# Patient Record
Sex: Female | Born: 1940 | Race: Black or African American | Hispanic: No | Smoking: Former smoker
Health system: Southern US, Community
[De-identification: ages and names within clinical notes are randomized; demographics above are authoritative.]

## PROBLEM LIST (undated history)

## (undated) DIAGNOSIS — E785 Hyperlipidemia, unspecified: Secondary | ICD-10-CM

## (undated) DIAGNOSIS — T7840XA Allergy, unspecified, initial encounter: Secondary | ICD-10-CM

## (undated) DIAGNOSIS — M1990 Unspecified osteoarthritis, unspecified site: Secondary | ICD-10-CM

## (undated) DIAGNOSIS — I10 Essential (primary) hypertension: Secondary | ICD-10-CM

## (undated) DIAGNOSIS — N189 Chronic kidney disease, unspecified: Secondary | ICD-10-CM

## (undated) DIAGNOSIS — E119 Type 2 diabetes mellitus without complications: Secondary | ICD-10-CM

## (undated) DIAGNOSIS — H269 Unspecified cataract: Secondary | ICD-10-CM

## (undated) HISTORY — DX: Unspecified osteoarthritis, unspecified site: M19.90

## (undated) HISTORY — DX: Type 2 diabetes mellitus without complications: E11.9

## (undated) HISTORY — DX: Allergy, unspecified, initial encounter: T78.40XA

## (undated) HISTORY — DX: Unspecified cataract: H26.9

## (undated) HISTORY — DX: Essential (primary) hypertension: I10

## (undated) HISTORY — DX: Hyperlipidemia, unspecified: E78.5

---

## 1994-05-16 HISTORY — PX: COLONOSCOPY W/ POLYPECTOMY: SHX1380

## 2004-05-15 ENCOUNTER — Ambulatory Visit: Payer: Self-pay | Admitting: Podiatry

## 2008-04-07 HISTORY — PX: KNEE SURGERY: SHX244

## 2008-08-08 ENCOUNTER — Emergency Department: Payer: Self-pay | Admitting: Emergency Medicine

## 2008-09-11 ENCOUNTER — Ambulatory Visit: Payer: Self-pay | Admitting: Specialist

## 2008-09-26 ENCOUNTER — Ambulatory Visit: Payer: Self-pay | Admitting: Specialist

## 2008-10-20 ENCOUNTER — Ambulatory Visit: Payer: Self-pay | Admitting: Specialist

## 2008-10-26 ENCOUNTER — Emergency Department: Payer: Self-pay | Admitting: Emergency Medicine

## 2009-05-17 ENCOUNTER — Ambulatory Visit: Payer: Self-pay | Admitting: Anesthesiology

## 2010-08-02 ENCOUNTER — Ambulatory Visit: Payer: Self-pay | Admitting: Anesthesiology

## 2011-09-04 DIAGNOSIS — E559 Vitamin D deficiency, unspecified: Secondary | ICD-10-CM | POA: Diagnosis not present

## 2011-09-04 DIAGNOSIS — R7301 Impaired fasting glucose: Secondary | ICD-10-CM | POA: Diagnosis not present

## 2011-09-04 DIAGNOSIS — E785 Hyperlipidemia, unspecified: Secondary | ICD-10-CM | POA: Diagnosis not present

## 2011-09-04 DIAGNOSIS — I10 Essential (primary) hypertension: Secondary | ICD-10-CM | POA: Diagnosis not present

## 2011-09-04 DIAGNOSIS — Z23 Encounter for immunization: Secondary | ICD-10-CM | POA: Diagnosis not present

## 2011-10-01 ENCOUNTER — Ambulatory Visit: Payer: Self-pay | Admitting: Family Medicine

## 2011-10-01 DIAGNOSIS — R928 Other abnormal and inconclusive findings on diagnostic imaging of breast: Secondary | ICD-10-CM | POA: Diagnosis not present

## 2011-10-01 DIAGNOSIS — Z1231 Encounter for screening mammogram for malignant neoplasm of breast: Secondary | ICD-10-CM | POA: Diagnosis not present

## 2011-11-24 ENCOUNTER — Emergency Department: Payer: Self-pay | Admitting: Emergency Medicine

## 2011-11-24 DIAGNOSIS — Z79899 Other long term (current) drug therapy: Secondary | ICD-10-CM | POA: Diagnosis not present

## 2011-11-24 DIAGNOSIS — I10 Essential (primary) hypertension: Secondary | ICD-10-CM | POA: Diagnosis not present

## 2011-11-24 DIAGNOSIS — IMO0002 Reserved for concepts with insufficient information to code with codable children: Secondary | ICD-10-CM | POA: Diagnosis not present

## 2011-11-26 ENCOUNTER — Emergency Department: Payer: Self-pay | Admitting: Emergency Medicine

## 2011-11-26 DIAGNOSIS — Z86010 Personal history of colonic polyps: Secondary | ICD-10-CM | POA: Diagnosis not present

## 2011-11-26 DIAGNOSIS — Z1211 Encounter for screening for malignant neoplasm of colon: Secondary | ICD-10-CM | POA: Diagnosis not present

## 2011-11-26 DIAGNOSIS — Z09 Encounter for follow-up examination after completed treatment for conditions other than malignant neoplasm: Secondary | ICD-10-CM | POA: Diagnosis not present

## 2011-11-26 DIAGNOSIS — Z79899 Other long term (current) drug therapy: Secondary | ICD-10-CM | POA: Diagnosis not present

## 2011-11-26 DIAGNOSIS — I10 Essential (primary) hypertension: Secondary | ICD-10-CM | POA: Diagnosis not present

## 2011-12-01 LAB — WOUND CULTURE

## 2011-12-23 ENCOUNTER — Ambulatory Visit: Payer: Self-pay | Admitting: Unknown Physician Specialty

## 2011-12-23 DIAGNOSIS — K620 Anal polyp: Secondary | ICD-10-CM | POA: Diagnosis not present

## 2011-12-23 DIAGNOSIS — Z9889 Other specified postprocedural states: Secondary | ICD-10-CM | POA: Diagnosis not present

## 2011-12-23 DIAGNOSIS — Z1211 Encounter for screening for malignant neoplasm of colon: Secondary | ICD-10-CM | POA: Diagnosis not present

## 2011-12-23 DIAGNOSIS — D126 Benign neoplasm of colon, unspecified: Secondary | ICD-10-CM | POA: Diagnosis not present

## 2011-12-23 DIAGNOSIS — E785 Hyperlipidemia, unspecified: Secondary | ICD-10-CM | POA: Diagnosis not present

## 2011-12-23 DIAGNOSIS — Z86010 Personal history of colonic polyps: Secondary | ICD-10-CM | POA: Diagnosis not present

## 2011-12-23 DIAGNOSIS — K5730 Diverticulosis of large intestine without perforation or abscess without bleeding: Secondary | ICD-10-CM | POA: Diagnosis not present

## 2011-12-23 DIAGNOSIS — E669 Obesity, unspecified: Secondary | ICD-10-CM | POA: Diagnosis not present

## 2011-12-23 DIAGNOSIS — Z6841 Body Mass Index (BMI) 40.0 and over, adult: Secondary | ICD-10-CM | POA: Diagnosis not present

## 2011-12-23 DIAGNOSIS — K621 Rectal polyp: Secondary | ICD-10-CM | POA: Diagnosis not present

## 2011-12-23 DIAGNOSIS — Z09 Encounter for follow-up examination after completed treatment for conditions other than malignant neoplasm: Secondary | ICD-10-CM | POA: Diagnosis not present

## 2011-12-23 DIAGNOSIS — I10 Essential (primary) hypertension: Secondary | ICD-10-CM | POA: Diagnosis not present

## 2011-12-23 DIAGNOSIS — Z808 Family history of malignant neoplasm of other organs or systems: Secondary | ICD-10-CM | POA: Diagnosis not present

## 2011-12-23 DIAGNOSIS — K648 Other hemorrhoids: Secondary | ICD-10-CM | POA: Diagnosis not present

## 2011-12-23 DIAGNOSIS — Z87891 Personal history of nicotine dependence: Secondary | ICD-10-CM | POA: Diagnosis not present

## 2011-12-23 DIAGNOSIS — Z801 Family history of malignant neoplasm of trachea, bronchus and lung: Secondary | ICD-10-CM | POA: Diagnosis not present

## 2011-12-23 DIAGNOSIS — Z79899 Other long term (current) drug therapy: Secondary | ICD-10-CM | POA: Diagnosis not present

## 2011-12-23 DIAGNOSIS — D128 Benign neoplasm of rectum: Secondary | ICD-10-CM | POA: Diagnosis not present

## 2011-12-23 DIAGNOSIS — Z800 Family history of malignant neoplasm of digestive organs: Secondary | ICD-10-CM | POA: Diagnosis not present

## 2011-12-23 HISTORY — PX: COLONOSCOPY W/ POLYPECTOMY: SHX1380

## 2011-12-24 LAB — PATHOLOGY REPORT

## 2012-04-06 DIAGNOSIS — E785 Hyperlipidemia, unspecified: Secondary | ICD-10-CM | POA: Diagnosis not present

## 2012-04-06 DIAGNOSIS — M25559 Pain in unspecified hip: Secondary | ICD-10-CM | POA: Diagnosis not present

## 2012-04-06 DIAGNOSIS — I10 Essential (primary) hypertension: Secondary | ICD-10-CM | POA: Diagnosis not present

## 2012-05-02 ENCOUNTER — Emergency Department: Payer: Self-pay | Admitting: Emergency Medicine

## 2012-05-02 DIAGNOSIS — Z79899 Other long term (current) drug therapy: Secondary | ICD-10-CM | POA: Diagnosis not present

## 2012-05-02 DIAGNOSIS — H6980 Other specified disorders of Eustachian tube, unspecified ear: Secondary | ICD-10-CM | POA: Diagnosis not present

## 2012-05-02 DIAGNOSIS — R059 Cough, unspecified: Secondary | ICD-10-CM | POA: Diagnosis not present

## 2012-05-02 DIAGNOSIS — I10 Essential (primary) hypertension: Secondary | ICD-10-CM | POA: Diagnosis not present

## 2012-05-02 DIAGNOSIS — R05 Cough: Secondary | ICD-10-CM | POA: Diagnosis not present

## 2012-06-02 DIAGNOSIS — M79609 Pain in unspecified limb: Secondary | ICD-10-CM | POA: Diagnosis not present

## 2012-06-02 DIAGNOSIS — B351 Tinea unguium: Secondary | ICD-10-CM | POA: Diagnosis not present

## 2012-06-02 DIAGNOSIS — M2010 Hallux valgus (acquired), unspecified foot: Secondary | ICD-10-CM | POA: Diagnosis not present

## 2012-07-14 DIAGNOSIS — I10 Essential (primary) hypertension: Secondary | ICD-10-CM | POA: Diagnosis not present

## 2012-07-14 DIAGNOSIS — E785 Hyperlipidemia, unspecified: Secondary | ICD-10-CM | POA: Diagnosis not present

## 2012-08-25 DIAGNOSIS — B351 Tinea unguium: Secondary | ICD-10-CM | POA: Diagnosis not present

## 2012-08-25 DIAGNOSIS — M79609 Pain in unspecified limb: Secondary | ICD-10-CM | POA: Diagnosis not present

## 2012-10-26 DIAGNOSIS — E785 Hyperlipidemia, unspecified: Secondary | ICD-10-CM | POA: Diagnosis not present

## 2012-10-26 DIAGNOSIS — M25559 Pain in unspecified hip: Secondary | ICD-10-CM | POA: Diagnosis not present

## 2012-10-26 DIAGNOSIS — I10 Essential (primary) hypertension: Secondary | ICD-10-CM | POA: Diagnosis not present

## 2012-11-03 DIAGNOSIS — M1710 Unilateral primary osteoarthritis, unspecified knee: Secondary | ICD-10-CM | POA: Diagnosis not present

## 2012-11-03 DIAGNOSIS — M1610 Unilateral primary osteoarthritis, unspecified hip: Secondary | ICD-10-CM | POA: Diagnosis not present

## 2012-11-10 DIAGNOSIS — M1610 Unilateral primary osteoarthritis, unspecified hip: Secondary | ICD-10-CM | POA: Diagnosis not present

## 2012-11-10 DIAGNOSIS — M1710 Unilateral primary osteoarthritis, unspecified knee: Secondary | ICD-10-CM | POA: Diagnosis not present

## 2012-11-12 DIAGNOSIS — M1710 Unilateral primary osteoarthritis, unspecified knee: Secondary | ICD-10-CM | POA: Diagnosis not present

## 2012-11-12 DIAGNOSIS — M1610 Unilateral primary osteoarthritis, unspecified hip: Secondary | ICD-10-CM | POA: Diagnosis not present

## 2012-11-16 DIAGNOSIS — M1610 Unilateral primary osteoarthritis, unspecified hip: Secondary | ICD-10-CM | POA: Diagnosis not present

## 2012-11-16 DIAGNOSIS — M1710 Unilateral primary osteoarthritis, unspecified knee: Secondary | ICD-10-CM | POA: Diagnosis not present

## 2012-11-17 ENCOUNTER — Ambulatory Visit: Payer: Self-pay | Admitting: Family Medicine

## 2012-11-17 DIAGNOSIS — Z1231 Encounter for screening mammogram for malignant neoplasm of breast: Secondary | ICD-10-CM | POA: Diagnosis not present

## 2012-11-18 DIAGNOSIS — M1710 Unilateral primary osteoarthritis, unspecified knee: Secondary | ICD-10-CM | POA: Diagnosis not present

## 2012-11-18 DIAGNOSIS — M1610 Unilateral primary osteoarthritis, unspecified hip: Secondary | ICD-10-CM | POA: Diagnosis not present

## 2012-11-24 DIAGNOSIS — B351 Tinea unguium: Secondary | ICD-10-CM | POA: Diagnosis not present

## 2012-11-24 DIAGNOSIS — M79609 Pain in unspecified limb: Secondary | ICD-10-CM | POA: Diagnosis not present

## 2013-01-26 DIAGNOSIS — E785 Hyperlipidemia, unspecified: Secondary | ICD-10-CM | POA: Diagnosis not present

## 2013-01-26 DIAGNOSIS — I10 Essential (primary) hypertension: Secondary | ICD-10-CM | POA: Diagnosis not present

## 2013-02-11 DIAGNOSIS — H43399 Other vitreous opacities, unspecified eye: Secondary | ICD-10-CM | POA: Diagnosis not present

## 2013-02-11 DIAGNOSIS — H35379 Puckering of macula, unspecified eye: Secondary | ICD-10-CM | POA: Diagnosis not present

## 2013-02-21 ENCOUNTER — Encounter: Payer: Self-pay | Admitting: Podiatry

## 2013-02-23 ENCOUNTER — Ambulatory Visit (INDEPENDENT_AMBULATORY_CARE_PROVIDER_SITE_OTHER): Payer: Medicare Other | Admitting: Podiatry

## 2013-02-23 ENCOUNTER — Encounter: Payer: Self-pay | Admitting: Podiatry

## 2013-02-23 VITALS — BP 132/66 | HR 82 | Resp 16 | Ht 63.0 in | Wt 243.0 lb

## 2013-02-23 DIAGNOSIS — B351 Tinea unguium: Secondary | ICD-10-CM | POA: Diagnosis not present

## 2013-02-23 DIAGNOSIS — M79609 Pain in unspecified limb: Secondary | ICD-10-CM

## 2013-02-23 NOTE — Progress Notes (Signed)
Tammy Lozano presents today as a 72-year-old white female a chief complaint of painful toenails one through 5 bilateral.  Objective: Pulses are palpable bilateral. Nails are thick yellow dystrophic clinically mycotic. Painful on palpation.  Assessment: Pain in limb secondary to onychomycosis 1 through 5 bilateral.  Plan: Debridement nails 1 through 5 bilateral. 

## 2013-04-28 DIAGNOSIS — I10 Essential (primary) hypertension: Secondary | ICD-10-CM | POA: Diagnosis not present

## 2013-04-28 DIAGNOSIS — E785 Hyperlipidemia, unspecified: Secondary | ICD-10-CM | POA: Diagnosis not present

## 2013-05-04 DIAGNOSIS — E870 Hyperosmolality and hypernatremia: Secondary | ICD-10-CM | POA: Diagnosis not present

## 2013-05-25 ENCOUNTER — Ambulatory Visit: Payer: Medicare Other | Admitting: Podiatry

## 2013-05-30 DIAGNOSIS — N289 Disorder of kidney and ureter, unspecified: Secondary | ICD-10-CM | POA: Diagnosis not present

## 2013-05-30 DIAGNOSIS — M25559 Pain in unspecified hip: Secondary | ICD-10-CM | POA: Diagnosis not present

## 2013-06-24 DIAGNOSIS — N179 Acute kidney failure, unspecified: Secondary | ICD-10-CM | POA: Diagnosis not present

## 2013-06-24 DIAGNOSIS — I10 Essential (primary) hypertension: Secondary | ICD-10-CM | POA: Diagnosis not present

## 2013-06-24 DIAGNOSIS — E785 Hyperlipidemia, unspecified: Secondary | ICD-10-CM | POA: Diagnosis not present

## 2013-06-27 DIAGNOSIS — G8929 Other chronic pain: Secondary | ICD-10-CM | POA: Diagnosis not present

## 2013-06-27 DIAGNOSIS — E785 Hyperlipidemia, unspecified: Secondary | ICD-10-CM | POA: Diagnosis not present

## 2013-06-27 DIAGNOSIS — R7309 Other abnormal glucose: Secondary | ICD-10-CM | POA: Diagnosis not present

## 2013-06-27 DIAGNOSIS — M25559 Pain in unspecified hip: Secondary | ICD-10-CM | POA: Diagnosis not present

## 2013-07-07 DIAGNOSIS — N179 Acute kidney failure, unspecified: Secondary | ICD-10-CM | POA: Diagnosis not present

## 2013-07-20 DIAGNOSIS — G8929 Other chronic pain: Secondary | ICD-10-CM | POA: Diagnosis not present

## 2013-07-20 DIAGNOSIS — E785 Hyperlipidemia, unspecified: Secondary | ICD-10-CM | POA: Diagnosis not present

## 2013-07-20 DIAGNOSIS — M25559 Pain in unspecified hip: Secondary | ICD-10-CM | POA: Diagnosis not present

## 2013-07-20 DIAGNOSIS — N183 Chronic kidney disease, stage 3 unspecified: Secondary | ICD-10-CM | POA: Diagnosis not present

## 2013-08-02 DIAGNOSIS — N179 Acute kidney failure, unspecified: Secondary | ICD-10-CM | POA: Diagnosis not present

## 2013-08-05 DIAGNOSIS — N179 Acute kidney failure, unspecified: Secondary | ICD-10-CM | POA: Diagnosis not present

## 2013-08-05 DIAGNOSIS — I10 Essential (primary) hypertension: Secondary | ICD-10-CM | POA: Diagnosis not present

## 2013-08-05 DIAGNOSIS — R609 Edema, unspecified: Secondary | ICD-10-CM | POA: Diagnosis not present

## 2013-10-24 DIAGNOSIS — I10 Essential (primary) hypertension: Secondary | ICD-10-CM | POA: Diagnosis not present

## 2013-10-24 DIAGNOSIS — R7989 Other specified abnormal findings of blood chemistry: Secondary | ICD-10-CM | POA: Diagnosis not present

## 2013-10-24 DIAGNOSIS — E785 Hyperlipidemia, unspecified: Secondary | ICD-10-CM | POA: Diagnosis not present

## 2014-01-24 DIAGNOSIS — I10 Essential (primary) hypertension: Secondary | ICD-10-CM | POA: Diagnosis not present

## 2014-01-24 DIAGNOSIS — R7989 Other specified abnormal findings of blood chemistry: Secondary | ICD-10-CM | POA: Diagnosis not present

## 2014-01-24 DIAGNOSIS — E785 Hyperlipidemia, unspecified: Secondary | ICD-10-CM | POA: Diagnosis not present

## 2014-01-24 DIAGNOSIS — R748 Abnormal levels of other serum enzymes: Secondary | ICD-10-CM | POA: Diagnosis not present

## 2014-01-24 DIAGNOSIS — Z1389 Encounter for screening for other disorder: Secondary | ICD-10-CM | POA: Diagnosis not present

## 2014-01-24 DIAGNOSIS — R296 Repeated falls: Secondary | ICD-10-CM | POA: Diagnosis not present

## 2014-01-27 DIAGNOSIS — E876 Hypokalemia: Secondary | ICD-10-CM | POA: Diagnosis not present

## 2014-03-06 ENCOUNTER — Ambulatory Visit (INDEPENDENT_AMBULATORY_CARE_PROVIDER_SITE_OTHER): Payer: Medicare Other | Admitting: Podiatry

## 2014-03-06 ENCOUNTER — Ambulatory Visit: Payer: Medicare Other | Admitting: Podiatry

## 2014-03-06 DIAGNOSIS — B351 Tinea unguium: Secondary | ICD-10-CM

## 2014-03-06 DIAGNOSIS — M79673 Pain in unspecified foot: Secondary | ICD-10-CM | POA: Diagnosis not present

## 2014-03-06 NOTE — Progress Notes (Signed)
Tammy Lozano presents today as a 73 year old white female a chief complaint of painful toenails one through 5 bilateral.  Objective: Pulses are palpable bilateral. Nails are thick yellow dystrophic clinically mycotic. Painful on palpation.  Assessment: Pain in limb secondary to onychomycosis 1 through 5 bilateral.  Plan: Debridement nails 1 through 5 bilateral.

## 2014-04-27 DIAGNOSIS — I10 Essential (primary) hypertension: Secondary | ICD-10-CM | POA: Diagnosis not present

## 2014-04-27 DIAGNOSIS — E785 Hyperlipidemia, unspecified: Secondary | ICD-10-CM | POA: Diagnosis not present

## 2014-06-05 ENCOUNTER — Other Ambulatory Visit: Payer: Medicare Other

## 2014-06-14 ENCOUNTER — Ambulatory Visit (INDEPENDENT_AMBULATORY_CARE_PROVIDER_SITE_OTHER): Payer: Medicare Other | Admitting: Podiatry

## 2014-06-14 DIAGNOSIS — B351 Tinea unguium: Secondary | ICD-10-CM

## 2014-06-14 DIAGNOSIS — M79676 Pain in unspecified toe(s): Secondary | ICD-10-CM

## 2014-06-14 NOTE — Progress Notes (Signed)
Tammy Lozano presents today as a 74 year old white female a chief complaint of painful toenails one through 5 bilateral.  Objective: Pulses are palpable bilateral. Nails are thick yellow dystrophic clinically mycotic. Painful on palpation.  Assessment: Pain in limb secondary to onychomycosis 1 through 5 bilateral.  Plan: Debridement nails 1 through 5 bilateral.

## 2014-06-26 DIAGNOSIS — R05 Cough: Secondary | ICD-10-CM | POA: Diagnosis not present

## 2014-07-27 ENCOUNTER — Ambulatory Visit
Admit: 2014-07-27 | Disposition: A | Discharge status: Home / Self Care | Payer: Self-pay | Attending: Family Medicine | Admitting: Family Medicine

## 2014-07-27 DIAGNOSIS — R7309 Other abnormal glucose: Secondary | ICD-10-CM | POA: Diagnosis not present

## 2014-07-27 DIAGNOSIS — R05 Cough: Secondary | ICD-10-CM | POA: Diagnosis not present

## 2014-07-27 DIAGNOSIS — E785 Hyperlipidemia, unspecified: Secondary | ICD-10-CM | POA: Diagnosis not present

## 2014-07-27 DIAGNOSIS — I10 Essential (primary) hypertension: Secondary | ICD-10-CM | POA: Diagnosis not present

## 2014-08-10 DIAGNOSIS — N189 Chronic kidney disease, unspecified: Secondary | ICD-10-CM | POA: Diagnosis not present

## 2014-08-10 DIAGNOSIS — I10 Essential (primary) hypertension: Secondary | ICD-10-CM | POA: Diagnosis not present

## 2014-08-21 ENCOUNTER — Ambulatory Visit (INDEPENDENT_AMBULATORY_CARE_PROVIDER_SITE_OTHER): Payer: Medicare Other | Admitting: Podiatry

## 2014-08-21 DIAGNOSIS — B351 Tinea unguium: Secondary | ICD-10-CM | POA: Diagnosis not present

## 2014-08-21 DIAGNOSIS — M79676 Pain in unspecified toe(s): Secondary | ICD-10-CM | POA: Diagnosis not present

## 2014-08-21 NOTE — Progress Notes (Signed)
Lauralye presents today as a 74 year old white female a chief complaint of painful toenails one through 5 bilateral.  Objective: Pulses are palpable bilateral. Nails are thick yellow dystrophic clinically mycotic. Painful on palpation.  Assessment: Pain in limb secondary to onychomycosis 1 through 5 bilateral.  Plan: Debridement nails 1 through 5 bilateral.

## 2014-08-31 DIAGNOSIS — I10 Essential (primary) hypertension: Secondary | ICD-10-CM | POA: Diagnosis not present

## 2014-11-10 ENCOUNTER — Telehealth: Payer: Self-pay | Admitting: Family Medicine

## 2014-11-10 NOTE — Telephone Encounter (Signed)
PT NEEDS REFILLS ON HER ATORVASTIN 20MG . PHARM IS [facility] ON [address]. PT ONLY HAS 4 LEFT.

## 2014-11-13 MED ORDER — ATORVASTATIN CALCIUM 20 MG PO TABS: 20.0000 mg | ORAL_TABLET | Freq: Every day | ORAL | Status: DC

## 2014-11-13 NOTE — Telephone Encounter (Signed)
Medication has been refilled and sent to [facility] [address] 

## 2014-11-22 ENCOUNTER — Ambulatory Visit: Payer: Medicare Other | Admitting: Podiatry

## 2014-11-27 ENCOUNTER — Ambulatory Visit (INDEPENDENT_AMBULATORY_CARE_PROVIDER_SITE_OTHER): Payer: Medicare Other | Admitting: Podiatry

## 2014-11-27 DIAGNOSIS — M79676 Pain in unspecified toe(s): Secondary | ICD-10-CM

## 2014-11-27 DIAGNOSIS — B351 Tinea unguium: Secondary | ICD-10-CM

## 2014-11-27 NOTE — Progress Notes (Signed)
Tammy Lozano presents today as a 74 year old white female a chief complaint of painful toenails one through 5 bilateral. She would also like for [place] to take a look at her feet.  Objective: Pulses are palpable bilateral. Nails are thick yellow dystrophic clinically mycotic. Painful on palpation. No open lesions no open wounds mild digital deformities uncomplicated.  Assessment: Pain in limb secondary to onychomycosis 1 through 5 bilateral.  Plan: Debridement nails 1 through 5 bilateral. 3 mo.  Roselind Messier DPM

## 2014-12-04 ENCOUNTER — Encounter: Payer: Self-pay | Admitting: Family Medicine

## 2014-12-04 ENCOUNTER — Ambulatory Visit (INDEPENDENT_AMBULATORY_CARE_PROVIDER_SITE_OTHER): Payer: Medicare Other | Admitting: Family Medicine

## 2014-12-04 VITALS — BP 128/72 | HR 119 | Temp 97.8°F | Resp 18 | Wt 238.6 lb

## 2014-12-04 DIAGNOSIS — E785 Hyperlipidemia, unspecified: Secondary | ICD-10-CM

## 2014-12-04 DIAGNOSIS — I10 Essential (primary) hypertension: Secondary | ICD-10-CM

## 2014-12-04 MED ORDER — ATORVASTATIN CALCIUM 20 MG PO TABS: 20.0000 mg | ORAL_TABLET | Freq: Every day | ORAL | Status: DC

## 2014-12-04 MED ORDER — LOSARTAN POTASSIUM-HCTZ 50-12.5 MG PO TABS
1.0000 | ORAL_TABLET | Freq: Every day | ORAL | Status: DC
Start: 2014-12-04 — End: 2015-03-06

## 2014-12-04 NOTE — Progress Notes (Signed)
Name: Tammy Lozano   MRN: [id number]    DOB: Sep 05, 1940   Date:12/04/2014       Progress Note  Subjective  Chief Complaint  Chief Complaint  Patient presents with  . Follow-up    patient is here for her 18-month follow-up. patient stated she does not need any refill of her medciations.  . Hypertension  . Arthritis    left hip    Hypertension This is a chronic problem. The problem is controlled. Pertinent negatives include no chest pain, headaches, palpitations or shortness of breath. Past treatments include angiotensin blockers and diuretics. The current treatment provides significant improvement. There is no history of kidney disease, CAD/MI or CVA.  Hyperlipidemia This is a chronic problem. The problem is controlled. Recent lipid tests were reviewed and are normal. Pertinent negatives include no chest pain, leg pain, myalgias or shortness of breath. Current antihyperlipidemic treatment includes statins. There are no compliance problems.     Past Medical History  Diagnosis Date  . Osteoarthritis   . Hypertension   . Hyperlipidemia     Past Surgical History  Procedure Laterality Date  . Knee surgery  2010    Family History  Problem Relation Age of Onset  . Cancer Mother   . Cancer Father     Social History   Social History  . Marital Status: Divorced    Spouse Name: N/A  . Number of Children: N/A  . Years of Education: N/A   Occupational History  . Not on file.   Social History Main Topics  . Smoking status: Former Smoker    Types: Cigarettes  . Smokeless tobacco: Not on file  . Alcohol Use: No  . Drug Use: No  . Sexual Activity: No   Other Topics Concern  . Not on file   Social History Narrative     Current outpatient prescriptions:  .  Potassium Gluconate 595 MG CAPS, Take by mouth., Disp: , Rfl:  .  atorvastatin (LIPITOR) 20 MG tablet, Take 1 tablet (20 mg total) by mouth daily., Disp: 90 tablet, Rfl: 0 .  Cholecalciferol (VITAMIN D-3 PO), Take by  mouth., Disp: , Rfl:  .  losartan-hydrochlorothiazide (HYZAAR) 50-12.5 MG per tablet, , Disp: , Rfl:  .  Omega-3 Fatty Acids (FISH OIL) 1000 MG CAPS, Take 1,000 mg by mouth., Disp: , Rfl:   No Known Allergies   Review of Systems  Respiratory: Negative for shortness of breath.   Cardiovascular: Negative for chest pain and palpitations.  Musculoskeletal: Negative for myalgias.  Neurological: Negative for headaches.      Objective  Filed Vitals:   12/04/14 0920  BP: 128/72  Pulse: 119  Temp: 97.8 F (36.6 C)  TempSrc: Oral  Resp: 18  Weight: 238 lb 9.6 oz (108.228 kg)  SpO2: 95%    Physical Exam  Constitutional: She is oriented to person, place, and time and well-developed, well-nourished, and in no distress.  HENT:  Head: Normocephalic and atraumatic.  Cardiovascular: Normal rate and regular rhythm.   Pulmonary/Chest: Effort normal and breath sounds normal.  Musculoskeletal: She exhibits no edema.  Neurological: She is alert and oriented to person, place, and time.  Skin: Skin is warm and dry.  Nursing note and vitals reviewed.   Assessment & Plan  1. Essential hypertension Blood pressure stable on present therapy.  - losartan-hydrochlorothiazide (HYZAAR) 50-12.5 MG per tablet; Take 1 tablet by mouth daily.  Dispense: 90 tablet; Refill: 0  2. Hyperlipidemia Recheck lipid panel in November  2016.  - atorvastatin (LIPITOR) 20 MG tablet; Take 1 tablet (20 mg total) by mouth daily.  Dispense: 90 tablet; Refill: 0   Tammy Lozano Asad A. [facility] 12/04/2014 9:43 AM

## 2015-02-26 ENCOUNTER — Ambulatory Visit: Payer: Medicare Other

## 2015-03-06 ENCOUNTER — Ambulatory Visit (INDEPENDENT_AMBULATORY_CARE_PROVIDER_SITE_OTHER): Payer: Medicare Other | Admitting: Family Medicine

## 2015-03-06 ENCOUNTER — Encounter: Payer: Self-pay | Admitting: Family Medicine

## 2015-03-06 VITALS — BP 126/70 | HR 126 | Temp 97.7°F | Resp 20 | Ht 64.0 in | Wt 238.2 lb

## 2015-03-06 DIAGNOSIS — I10 Essential (primary) hypertension: Secondary | ICD-10-CM | POA: Diagnosis not present

## 2015-03-06 DIAGNOSIS — E785 Hyperlipidemia, unspecified: Secondary | ICD-10-CM | POA: Diagnosis not present

## 2015-03-06 MED ORDER — HYDROCHLOROTHIAZIDE 25 MG PO TABS: 25.0000 mg | ORAL_TABLET | Freq: Every day | ORAL | Status: DC

## 2015-03-06 NOTE — Progress Notes (Signed)
Name: Tammy Lozano   MRN: [id number]    DOB: August 04, 1940   Date:03/06/2015       Progress Note  Subjective  Chief Complaint  Chief Complaint  Patient presents with  . Follow-up    3 MO  . Labs Only    Fasting  . Hypertension  . Hyperlipidemia    Hypertension This is a chronic problem. The problem is controlled. Pertinent negatives include no blurred vision, chest pain, headaches, palpitations or shortness of breath. Past treatments include angiotensin blockers and diuretics. The current treatment provides significant improvement. There is no history of kidney disease, CAD/MI or CVA.  Hyperlipidemia This is a chronic problem. The problem is controlled. Recent lipid tests were reviewed and are normal. Exacerbating diseases include obesity. Pertinent negatives include no chest pain, leg pain, myalgias or shortness of breath. Current antihyperlipidemic treatment includes statins. There are no compliance problems.     Past Medical History  Diagnosis Date  . Osteoarthritis   . Hypertension   . Hyperlipidemia     Past Surgical History  Procedure Laterality Date  . Knee surgery  2010    Family History  Problem Relation Age of Onset  . Cancer Mother   . Cancer Father     Social History   Social History  . Marital Status: Divorced    Spouse Name: N/A  . Number of Children: N/A  . Years of Education: N/A   Occupational History  . Not on file.   Social History Main Topics  . Smoking status: Former Smoker    Types: Cigarettes  . Smokeless tobacco: Not on file  . Alcohol Use: No  . Drug Use: No  . Sexual Activity: No   Other Topics Concern  . Not on file   Social History Narrative    Current outpatient prescriptions:  .  atorvastatin (LIPITOR) 20 MG tablet, Take 1 tablet (20 mg total) by mouth daily., Disp: 90 tablet, Rfl: 0 .  Cholecalciferol (VITAMIN D-3 PO), Take by mouth., Disp: , Rfl:  .  losartan-hydrochlorothiazide (HYZAAR) 50-12.5 MG per tablet, Take 1  tablet by mouth daily., Disp: 90 tablet, Rfl: 0 .  Omega-3 Fatty Acids (FISH OIL) 1000 MG CAPS, Take 1,000 mg by mouth., Disp: , Rfl:  .  Potassium Gluconate 595 MG CAPS, Take by mouth., Disp: , Rfl:   No Known Allergies   Review of Systems  Eyes: Negative for blurred vision.  Respiratory: Positive for cough (dry cough for over 1 month, described as 'tickle in my throat'. Pt. had to D/C Lisinopril because of cough). Negative for shortness of breath.   Cardiovascular: Negative for chest pain and palpitations.  Musculoskeletal: Negative for myalgias.  Neurological: Negative for headaches.    Objective  Filed Vitals:   03/06/15 0838  BP: 126/70  Pulse: 126  Temp: 97.7 F (36.5 C)  TempSrc: Oral  Resp: 20  Height: 5\' 4"  (1.626 m)  Weight: 238 lb 3.2 oz (108.047 kg)  SpO2: 96%    Physical Exam  Constitutional: She is oriented to person, place, and time and well-developed, well-nourished, and in no distress.  HENT:  Mouth/Throat: Oropharynx is clear and moist and mucous membranes are normal.  Cardiovascular: Normal rate, regular rhythm and normal heart sounds.   No murmur heard. Pulmonary/Chest: Effort normal and breath sounds normal. She has no wheezes.  Musculoskeletal: She exhibits no edema.  Neurological: She is alert and oriented to person, place, and time.  Nursing note and vitals reviewed.  Assessment & Plan  1. Essential hypertension DC losartan due to possible adverse effect of cough. We will start on hydrochlorothiazide at a higher dose of 25 mg daily. Recheck BP in one month. - hydrochlorothiazide (HYDRODIURIL) 25 MG tablet; Take 1 tablet (25 mg total) by mouth daily.  Dispense: 90 tablet; Refill: 0  2. Hyperlipidemia  - Comprehensive Metabolic Panel (CMET) - Lipid Profile   Denecia Brunette Asad A. [facility] 03/06/2015 8:53 AM

## 2015-03-07 DIAGNOSIS — E785 Hyperlipidemia, unspecified: Secondary | ICD-10-CM | POA: Diagnosis not present

## 2015-03-08 LAB — COMPREHENSIVE METABOLIC PANEL
ALT: 10 IU/L (ref 0–32)
AST: 11 IU/L (ref 0–40)
Albumin/Globulin Ratio: 1.6 (ref 1.1–2.5)
Albumin: 4.3 g/dL (ref 3.5–4.8)
Alkaline Phosphatase: 93 IU/L (ref 39–117)
BUN/Creatinine Ratio: 13 (ref 11–26)
BUN: 16 mg/dL (ref 8–27)
Bilirubin Total: 1.2 mg/dL (ref 0.0–1.2)
CO2: 24 mmol/L (ref 18–29)
Calcium: 9.9 mg/dL (ref 8.7–10.3)
Chloride: 94 mmol/L — ABNORMAL LOW (ref 97–106)
Creatinine, Ser: 1.22 mg/dL — ABNORMAL HIGH (ref 0.57–1.00)
GFR calc Af Amer: 50 mL/min/{1.73_m2} — ABNORMAL LOW (ref >59)
GFR calc non Af Amer: 43 mL/min/{1.73_m2} — ABNORMAL LOW (ref >59)
Globulin, Total: 2.7 g/dL (ref 1.5–4.5)
Glucose: 122 mg/dL — ABNORMAL HIGH (ref 65–99)
Potassium: 3.9 mmol/L (ref 3.5–5.2)
Sodium: 138 mmol/L (ref 136–144)
Total Protein: 7 g/dL (ref 6.0–8.5)

## 2015-03-08 LAB — LIPID PANEL
Chol/HDL Ratio: 3.9 ratio units (ref 0.0–4.4)
Cholesterol, Total: 189 mg/dL (ref 100–199)
HDL: 49 mg/dL (ref >39)
LDL Calculated: 113 mg/dL — ABNORMAL HIGH (ref 0–99)
Triglycerides: 134 mg/dL (ref 0–149)
VLDL Cholesterol Cal: 27 mg/dL (ref 5–40)

## 2015-03-13 ENCOUNTER — Ambulatory Visit (INDEPENDENT_AMBULATORY_CARE_PROVIDER_SITE_OTHER): Payer: Medicare Other | Admitting: Sports Medicine

## 2015-03-13 DIAGNOSIS — L84 Corns and callosities: Secondary | ICD-10-CM

## 2015-03-13 DIAGNOSIS — M79676 Pain in unspecified toe(s): Secondary | ICD-10-CM

## 2015-03-13 DIAGNOSIS — B351 Tinea unguium: Secondary | ICD-10-CM

## 2015-03-13 NOTE — Progress Notes (Signed)
Patient ID: SAANVI MALIA, female   DOB: 1941/02/04, 74 y.o.   MRN: [id number] Subjective: Tammy Lozano is a 74 y.o. female patient seen today in office with complaint of painful thickened and elongated toenails; unable to trim. Patient denies history of Diabetes, Neuropathy, or Vascular disease. Patient has no other pedal complaints at this time.   Patient Active Problem List   Diagnosis Date Noted  . Hypertension 12/04/2014  . Hyperlipidemia 12/04/2014   Current Outpatient Prescriptions on File Prior to Visit  Medication Sig Dispense Refill  . atorvastatin (LIPITOR) 20 MG tablet Take 1 tablet (20 mg total) by mouth daily. 90 tablet 0  . Cholecalciferol (VITAMIN D-3 PO) Take by mouth.    . hydrochlorothiazide (HYDRODIURIL) 25 MG tablet Take 1 tablet (25 mg total) by mouth daily. 90 tablet 0  . Omega-3 Fatty Acids (FISH OIL) 1000 MG CAPS Take 1,000 mg by mouth.    . Potassium Gluconate 595 MG CAPS Take by mouth.     No current facility-administered medications on file prior to visit.   No Known Allergies   Objective: Physical Exam  General: Well developed, nourished, no acute distress, awake, alert and oriented x 3  Vascular: Dorsalis pedis artery 2/4 bilateral, Posterior tibial artery 1/4 bilateral, skin temperature warm to warm proximal to distal bilateral lower extremities, no varicosities, scant pedal hair present bilateral.  Neurological: Gross sensation present via light touch bilateral.   Dermatological: Skin is warm, dry, and supple bilateral, Nails 1-10 are tender, long, thick, and discolored with mild subungal debris, no webspace macerations present bilateral, no open lesions present bilateral, + callus/hyperkeratotic tissue present bilateral medial hallux with no signs of infection bilateral.  Musculoskeletal: Asymptomatic bunion and hammertoe boney deformities noted bilateral. Muscular strength within normal limits without pain or limitation on range of motion. No pain with  calf compression bilateral.  Assessment and Plan:  Problem List Items Addressed This Visit    None    Visit Diagnoses    Dermatophytosis of nail    -  Primary    Callus of foot        medial hallux bilateral    Pain of toe, unspecified laterality          -Examined patient.  -Discussed treatment options for painful callus and mycotic nails. -Mechanically debrided callus x 2 and reduced mycotic nails with sterile nail nipper and dremel nail file without incident. -Recommend good supportive shoes and foot inspection daily  -Patient to return in 3 months for follow up evaluation or sooner if symptoms worsen.  Landis Martins, DPM

## 2015-04-05 ENCOUNTER — Ambulatory Visit: Payer: Medicare Other | Admitting: Family Medicine

## 2015-04-18 ENCOUNTER — Ambulatory Visit: Payer: Medicare Other | Admitting: Family Medicine

## 2015-05-08 ENCOUNTER — Encounter: Payer: Self-pay | Admitting: Family Medicine

## 2015-05-08 ENCOUNTER — Ambulatory Visit (INDEPENDENT_AMBULATORY_CARE_PROVIDER_SITE_OTHER): Payer: Medicare Other | Admitting: Family Medicine

## 2015-05-08 VITALS — BP 122/80 | HR 116 | Temp 97.8°F | Resp 16 | Ht 64.0 in | Wt 235.8 lb

## 2015-05-08 DIAGNOSIS — R05 Cough: Secondary | ICD-10-CM

## 2015-05-08 DIAGNOSIS — H65199 Other acute nonsuppurative otitis media, unspecified ear: Secondary | ICD-10-CM | POA: Insufficient documentation

## 2015-05-08 DIAGNOSIS — R053 Chronic cough: Secondary | ICD-10-CM

## 2015-05-08 DIAGNOSIS — E785 Hyperlipidemia, unspecified: Secondary | ICD-10-CM

## 2015-05-08 DIAGNOSIS — I10 Essential (primary) hypertension: Secondary | ICD-10-CM

## 2015-05-08 DIAGNOSIS — H65192 Other acute nonsuppurative otitis media, left ear: Secondary | ICD-10-CM | POA: Diagnosis not present

## 2015-05-08 MED ORDER — ATORVASTATIN CALCIUM 20 MG PO TABS: 20.0000 mg | ORAL_TABLET | Freq: Every day | ORAL | Status: DC

## 2015-05-08 MED ORDER — BENZONATATE 200 MG PO CAPS: 200.0000 mg | ORAL_CAPSULE | Freq: Two times a day (BID) | ORAL | Status: DC | PRN

## 2015-05-08 MED ORDER — FLUTICASONE PROPIONATE 50 MCG/ACT NA SUSP: 2.0000 | Freq: Every day | NASAL | Status: DC

## 2015-05-08 MED ORDER — HYDROCHLOROTHIAZIDE 25 MG PO TABS: 25.0000 mg | ORAL_TABLET | Freq: Every day | ORAL | Status: DC

## 2015-05-08 NOTE — Progress Notes (Signed)
Name: Tammy Lozano   MRN: [id number]    DOB: June 24, 1940   Date:05/08/2015       Progress Note  Subjective  Chief Complaint  Chief Complaint  Patient presents with  . Follow-up    1 month  . Hypertension    at last visit changed Losartan due to cough to HCTZ.  Patienjt reports today she still has a cough.    Hypertension This is a chronic problem. The problem is controlled. Pertinent negatives include no blurred vision, chest pain, headaches, palpitations or shortness of breath. Past treatments include diuretics. The current treatment provides significant improvement. There is no history of kidney disease.  Cough This is a recurrent problem. The current episode started more than 1 month ago. The cough is non-productive. Associated symptoms include nasal congestion (started having URI symptoms 2 days ago. Dry cough was present prioir to onset of symptoms.). Pertinent negatives include no chest pain, chills, fever, headaches, postnasal drip or shortness of breath.    Past Medical History  Diagnosis Date  . Osteoarthritis   . Hypertension   . Hyperlipidemia     Past Surgical History  Procedure Laterality Date  . Knee surgery  2010    Family History  Problem Relation Age of Onset  . Cancer Mother   . Cancer Father     Social History   Social History  . Marital Status: Divorced    Spouse Name: N/A  . Number of Children: N/A  . Years of Education: N/A   Occupational History  . Not on file.   Social History Main Topics  . Smoking status: Former Smoker    Types: Cigarettes  . Smokeless tobacco: Not on file  . Alcohol Use: No  . Drug Use: No  . Sexual Activity: No   Other Topics Concern  . Not on file   Social History Narrative     Current outpatient prescriptions:  .  atorvastatin (LIPITOR) 20 MG tablet, Take 1 tablet (20 mg total) by mouth daily., Disp: 90 tablet, Rfl: 0 .  Cholecalciferol (VITAMIN D-3 PO), Take by mouth., Disp: , Rfl:  .  hydrochlorothiazide  (HYDRODIURIL) 25 MG tablet, Take 1 tablet (25 mg total) by mouth daily., Disp: 90 tablet, Rfl: 0 .  Omega-3 Fatty Acids (FISH OIL) 1000 MG CAPS, Take 1,000 mg by mouth., Disp: , Rfl:  .  Potassium Gluconate 595 MG CAPS, Take by mouth., Disp: , Rfl:   No Known Allergies   Review of Systems  Constitutional: Negative for fever and chills.  HENT: Negative for postnasal drip.   Eyes: Negative for blurred vision.  Respiratory: Positive for cough. Negative for shortness of breath.   Cardiovascular: Negative for chest pain and palpitations.  Neurological: Negative for headaches.     Objective  Filed Vitals:   05/08/15 1106  BP: 122/80  Pulse: 116  Temp: 97.8 F (36.6 C)  TempSrc: Oral  Resp: 16  Height: 5\' 4"  (1.626 m)  Weight: 235 lb 12.8 oz (106.958 kg)  SpO2: 93%    Physical Exam  Constitutional: She is oriented to person, place, and time and well-developed, well-nourished, and in no distress.  HENT:  Head: Normocephalic and atraumatic.  Right Ear: Tympanic membrane and ear canal normal.  Left Ear: There is drainage. No swelling or tenderness. A middle ear effusion is present.  Mouth/Throat: Posterior oropharyngeal erythema present.  Left ear canal has whitish fluid-like discharge, and TM not visualized  Cardiovascular: Normal rate and regular rhythm.  Pulmonary/Chest: Breath sounds normal. She has no wheezes. She has no rhonchi.  Neurological: She is alert and oriented to person, place, and time.  Nursing note and vitals reviewed.     Assessment & Plan  1. Essential hypertension Blood pressure stable on HCTZ. Losartan has since been discontinued because of concerns for dry cough. Obtain potassium levels and follow-up.  - hydrochlorothiazide (HYDRODIURIL) 25 MG tablet; Take 1 tablet (25 mg total) by mouth daily.  Dispense: 90 tablet; Refill: 0 - Basic Metabolic Panel (BMET)  2. Persistent dry cough We'll start on Tessalon and follow-up. If cough is persistent, she  will need chest x-ray. - benzonatate (TESSALON) 200 MG capsule; Take 1 capsule (200 mg total) by mouth 2 (two) times daily as needed for cough.  Dispense: 20 capsule; Refill: 0  3. Acute MEE (middle ear effusion), left I daily from recent acute URI. No canal swelling, or fever suggesting acute infection - fluticasone (FLONASE) 50 MCG/ACT nasal spray; Place 2 sprays into both nostrils daily.  Dispense: 16 g; Refill: 0  4. Hyperlipidemia  - atorvastatin (LIPITOR) 20 MG tablet; Take 1 tablet (20 mg total) by mouth daily.  Dispense: 90 tablet; Refill: 0   Veryl Winemiller Asad A. [facility] 05/08/2015 11:32 AM

## 2015-05-09 LAB — BASIC METABOLIC PANEL
BUN/Creatinine Ratio: 15 (ref 11–26)
BUN: 17 mg/dL (ref 8–27)
CO2: 27 mmol/L (ref 18–29)
Calcium: 9.9 mg/dL (ref 8.7–10.3)
Chloride: 97 mmol/L (ref 96–106)
Creatinine, Ser: 1.12 mg/dL — ABNORMAL HIGH (ref 0.57–1.00)
GFR calc Af Amer: 56 mL/min/{1.73_m2} — ABNORMAL LOW (ref >59)
GFR calc non Af Amer: 48 mL/min/{1.73_m2} — ABNORMAL LOW (ref >59)
Glucose: 107 mg/dL — ABNORMAL HIGH (ref 65–99)
Potassium: 4.3 mmol/L (ref 3.5–5.2)
Sodium: 142 mmol/L (ref 134–144)

## 2015-06-05 ENCOUNTER — Encounter: Payer: Self-pay | Admitting: Family Medicine

## 2015-06-05 ENCOUNTER — Ambulatory Visit (INDEPENDENT_AMBULATORY_CARE_PROVIDER_SITE_OTHER): Payer: Medicare Other | Admitting: Family Medicine

## 2015-06-05 VITALS — BP 122/73 | HR 113 | Temp 98.6°F | Resp 19 | Ht 64.0 in | Wt 232.9 lb

## 2015-06-05 DIAGNOSIS — R739 Hyperglycemia, unspecified: Secondary | ICD-10-CM | POA: Diagnosis not present

## 2015-06-05 DIAGNOSIS — E785 Hyperlipidemia, unspecified: Secondary | ICD-10-CM | POA: Diagnosis not present

## 2015-06-05 DIAGNOSIS — I10 Essential (primary) hypertension: Secondary | ICD-10-CM

## 2015-06-05 LAB — GLUCOSE, POCT (MANUAL RESULT ENTRY): POC Glucose: 117 mg/dl — AB (ref 70–99)

## 2015-06-05 LAB — POCT GLYCOSYLATED HEMOGLOBIN (HGB A1C): Hemoglobin A1C: 6.1

## 2015-06-05 NOTE — Progress Notes (Signed)
Name: Tammy Lozano   MRN: [id number]    DOB: 1941/03/13   Date:06/05/2015       Progress Note  Subjective  Chief Complaint  Chief Complaint  Patient presents with  . Follow-up    1 mo  . Hypertension  . Hyperlipidemia    Hypertension This is a chronic problem. The problem is controlled. Pertinent negatives include no chest pain, palpitations or shortness of breath. Past treatments include diuretics. Hypertensive end-organ damage includes kidney disease (being followed by Nephrology.).  Hyperlipidemia This is a chronic problem. The problem is controlled. Exacerbating diseases include obesity. She has no history of diabetes. Pertinent negatives include no chest pain, myalgias or shortness of breath. Current antihyperlipidemic treatment includes statins.     Past Medical History  Diagnosis Date  . Osteoarthritis   . Hypertension   . Hyperlipidemia     Past Surgical History  Procedure Laterality Date  . Knee surgery  2010    Family History  Problem Relation Age of Onset  . Cancer Mother   . Cancer Father     Social History   Social History  . Marital Status: Divorced    Spouse Name: N/A  . Number of Children: N/A  . Years of Education: N/A   Occupational History  . Not on file.   Social History Main Topics  . Smoking status: Former Smoker    Types: Cigarettes  . Smokeless tobacco: Not on file  . Alcohol Use: No  . Drug Use: No  . Sexual Activity: No   Other Topics Concern  . Not on file   Social History Narrative     Current outpatient prescriptions:  .  atorvastatin (LIPITOR) 20 MG tablet, Take 1 tablet (20 mg total) by mouth daily., Disp: 90 tablet, Rfl: 0 .  Cholecalciferol (VITAMIN D-3 PO), Take by mouth., Disp: , Rfl:  .  hydrochlorothiazide (HYDRODIURIL) 25 MG tablet, Take 1 tablet (25 mg total) by mouth daily., Disp: 90 tablet, Rfl: 0 .  Omega-3 Fatty Acids (FISH OIL) 1000 MG CAPS, Take 1,000 mg by mouth., Disp: , Rfl:  .  Potassium Gluconate  595 MG CAPS, Take by mouth., Disp: , Rfl:   No Known Allergies   Review of Systems  Respiratory: Negative for shortness of breath.   Cardiovascular: Negative for chest pain and palpitations.  Musculoskeletal: Negative for myalgias.     Objective  Filed Vitals:   06/05/15 1055  BP: 122/73  Pulse: 113  Temp: 98.6 F (37 C)  TempSrc: Oral  Resp: 19  Height: 5\' 4"  (1.626 m)  Weight: 232 lb 14.4 oz (105.643 kg)  SpO2: 98%    Physical Exam  Constitutional: She is oriented to person, place, and time and well-developed, well-nourished, and in no distress.  HENT:  Head: Normocephalic and atraumatic.  Cardiovascular: Normal rate and regular rhythm.   Pulmonary/Chest: Breath sounds normal.  Musculoskeletal: She exhibits no edema.  Neurological: She is alert and oriented to person, place, and time.  Nursing note and vitals reviewed.    Assessment & Plan  1. Hyperlipidemia  - Hepatic function panel - Lipid Profile  2. Essential hypertension   Blood pressure stable and controlled on present therapy.  3. Hyperglycemia  - POCT HgB A1C - POCT Glucose (CBG)   Braylen Denunzio Asad A. [facility] 06/05/2015 11:04 AM

## 2015-06-06 LAB — HEPATIC FUNCTION PANEL
ALT: 12 IU/L (ref 0–32)
AST: 17 IU/L (ref 0–40)
Albumin: 4.2 g/dL (ref 3.5–4.8)
Alkaline Phosphatase: 84 IU/L (ref 39–117)
Bilirubin Total: 1 mg/dL (ref 0.0–1.2)
Bilirubin, Direct: 0.22 mg/dL (ref 0.00–0.40)
Total Protein: 7.1 g/dL (ref 6.0–8.5)

## 2015-06-06 LAB — LIPID PANEL
Chol/HDL Ratio: 3.4 ratio units (ref 0.0–4.4)
Cholesterol, Total: 171 mg/dL (ref 100–199)
HDL: 50 mg/dL (ref >39)
LDL Calculated: 100 mg/dL — ABNORMAL HIGH (ref 0–99)
Triglycerides: 106 mg/dL (ref 0–149)
VLDL Cholesterol Cal: 21 mg/dL (ref 5–40)

## 2015-06-12 ENCOUNTER — Encounter: Payer: Self-pay | Admitting: Sports Medicine

## 2015-06-12 ENCOUNTER — Ambulatory Visit (INDEPENDENT_AMBULATORY_CARE_PROVIDER_SITE_OTHER): Payer: Medicare Other | Admitting: Sports Medicine

## 2015-06-12 DIAGNOSIS — B351 Tinea unguium: Secondary | ICD-10-CM | POA: Diagnosis not present

## 2015-06-12 DIAGNOSIS — M79676 Pain in unspecified toe(s): Secondary | ICD-10-CM

## 2015-06-12 DIAGNOSIS — L84 Corns and callosities: Secondary | ICD-10-CM | POA: Diagnosis not present

## 2015-06-12 NOTE — Progress Notes (Signed)
Patient ID: FAITHMARIE BARSHINGER, female   DOB: November 15, 1940, 75 y.o.   MRN: [id number]  Subjective: MARLISSA ARKWRIGHT is a 75 y.o. female patient seen today in office with complaint of painful thickened and elongated toenails; unable to trim. Patient denies history of Diabetes, Neuropathy, or Vascular disease. Patient has no other pedal complaints at this time.   Patient Active Problem List   Diagnosis Date Noted  . Hyperglycemia 06/05/2015  . Hypertension 12/04/2014  . Hyperlipidemia 12/04/2014   Current Outpatient Prescriptions on File Prior to Visit  Medication Sig Dispense Refill  . atorvastatin (LIPITOR) 20 MG tablet Take 1 tablet (20 mg total) by mouth daily. 90 tablet 0  . Cholecalciferol (VITAMIN D-3 PO) Take by mouth.    . hydrochlorothiazide (HYDRODIURIL) 25 MG tablet Take 1 tablet (25 mg total) by mouth daily. 90 tablet 0  . Omega-3 Fatty Acids (FISH OIL) 1000 MG CAPS Take 1,000 mg by mouth.    . Potassium Gluconate 595 MG CAPS Take by mouth.     No current facility-administered medications on file prior to visit.   No Known Allergies   Objective: Physical Exam  General: Well developed, nourished, no acute distress, awake, alert and oriented x 3  Vascular: Dorsalis pedis artery 2/4 bilateral, Posterior tibial artery 1/4 bilateral, skin temperature warm to warm proximal to distal bilateral lower extremities, no varicosities, scant pedal hair present bilateral.  Neurological: Gross sensation present via light touch bilateral.   Dermatological: Skin is warm, dry, and supple bilateral, Nails 1-10 are tender, long, thick, and discolored with mild subungal debris, no webspace macerations present bilateral, no open lesions present bilateral, + callus/hyperkeratotic tissue present bilateral medial hallux with no signs of infection bilateral.  Musculoskeletal: Asymptomatic bunion and hammertoe boney deformities noted bilateral. Muscular strength within normal limits without pain or limitation  on range of motion. No pain with calf compression bilateral.  Assessment and Plan:  Problem List Items Addressed This Visit    None    Visit Diagnoses    Dermatophytosis of nail    -  Primary    Callus of foot        Pain of toe, unspecified laterality          -Examined patient.  -Discussed treatment options for painful callus and mycotic nails. -Mechanically debrided callus x 2 and reduced mycotic nails with sterile nail nipper and dremel nail file without incident. -Recommend good supportive shoes and foot inspection daily  -Patient to return in 3 months for follow up evaluation or sooner if symptoms worsen.  Landis Martins, DPM

## 2015-08-06 DIAGNOSIS — N183 Chronic kidney disease, stage 3 (moderate): Secondary | ICD-10-CM | POA: Diagnosis not present

## 2015-08-06 DIAGNOSIS — I10 Essential (primary) hypertension: Secondary | ICD-10-CM | POA: Diagnosis not present

## 2015-08-13 ENCOUNTER — Telehealth: Payer: Self-pay | Admitting: Family Medicine

## 2015-08-13 ENCOUNTER — Other Ambulatory Visit: Payer: Self-pay | Admitting: Family Medicine

## 2015-08-13 DIAGNOSIS — E785 Hyperlipidemia, unspecified: Secondary | ICD-10-CM

## 2015-08-13 MED ORDER — ATORVASTATIN CALCIUM 20 MG PO TABS: 20.0000 mg | ORAL_TABLET | Freq: Every day | ORAL | Status: DC

## 2015-08-13 NOTE — Telephone Encounter (Signed)
Pt needs refill on Atorvastatin to be sent to [facility]. Pt has a 3 mth fu appt at the end of the month.

## 2015-08-13 NOTE — Telephone Encounter (Signed)
Prescription for Atorvastatin has been sent to pharmacy

## 2015-08-28 ENCOUNTER — Encounter: Payer: Self-pay | Admitting: Family Medicine

## 2015-08-28 ENCOUNTER — Ambulatory Visit (INDEPENDENT_AMBULATORY_CARE_PROVIDER_SITE_OTHER): Payer: Medicare Other | Admitting: Family Medicine

## 2015-08-28 VITALS — BP 124/72 | HR 68 | Temp 98.4°F | Resp 15 | Ht 64.0 in | Wt 233.2 lb

## 2015-08-28 DIAGNOSIS — I10 Essential (primary) hypertension: Secondary | ICD-10-CM

## 2015-08-28 DIAGNOSIS — E785 Hyperlipidemia, unspecified: Secondary | ICD-10-CM

## 2015-08-28 MED ORDER — HYDROCHLOROTHIAZIDE 25 MG PO TABS: 25.0000 mg | ORAL_TABLET | Freq: Every day | ORAL | Status: DC

## 2015-08-28 NOTE — Progress Notes (Signed)
Name: Tammy Lozano   MRN: [id number]    DOB: [birth year]   Date:08/28/2015       Progress Note  Subjective  Chief Complaint  Chief Complaint  Patient presents with  . Follow-up    3 mo  . Medication Refill    hydrochlorothiazide 25 mg     Hypertension This is a chronic problem. The problem is controlled. Pertinent negatives include no chest pain, headaches, palpitations or shortness of breath. Past treatments include diuretics. Hypertensive end-organ damage includes kidney disease (being followed by Nephrology.).  Hyperlipidemia This is a chronic problem. The problem is controlled. Exacerbating diseases include obesity. She has no history of diabetes. Pertinent negatives include no chest pain, myalgias or shortness of breath. Current antihyperlipidemic treatment includes statins.    Past Medical History  Diagnosis Date  . Osteoarthritis   . Hypertension   . Hyperlipidemia     Past Surgical History  Procedure Laterality Date  . Knee surgery  2010    Family History  Problem Relation Age of Onset  . Cancer Mother   . Cancer Father     Social History   Social History  . Marital Status: Divorced    Spouse Name: N/A  . Number of Children: N/A  . Years of Education: N/A   Occupational History  . Not on file.   Social History Main Topics  . Smoking status: Former Smoker    Types: Cigarettes  . Smokeless tobacco: Not on file  . Alcohol Use: No  . Drug Use: No  . Sexual Activity: No   Other Topics Concern  . Not on file   Social History Narrative     Current outpatient prescriptions:  .  atorvastatin (LIPITOR) 20 MG tablet, Take 1 tablet (20 mg total) by mouth daily., Disp: 90 tablet, Rfl: 0 .  Cholecalciferol (VITAMIN D-3 PO), Take by mouth., Disp: , Rfl:  .  fluticasone (FLONASE) 50 MCG/ACT nasal spray, , Disp: , Rfl:  .  hydrochlorothiazide (HYDRODIURIL) 25 MG tablet, Take 1 tablet (25 mg total) by mouth daily., Disp: 90 tablet, Rfl: 0 .  Omega-3 Fatty  Acids (FISH OIL) 1000 MG CAPS, Take 1,000 mg by mouth., Disp: , Rfl:  .  Potassium Gluconate 595 MG CAPS, Take by mouth., Disp: , Rfl:   No Known Allergies   Review of Systems  Respiratory: Negative for shortness of breath.   Cardiovascular: Negative for chest pain and palpitations.  Musculoskeletal: Negative for myalgias.  Neurological: Negative for headaches.    Objective  Filed Vitals:   08/28/15 1041  BP: 124/72  Pulse: 68  Temp: 98.4 F (36.9 C)  TempSrc: Oral  Resp: 15  Height: 5\' 4"  (1.626 m)  Weight: 233 lb 3.2 oz (105.779 kg)  SpO2: 96%    Physical Exam  Constitutional: She is oriented to person, place, and time and well-developed, well-nourished, and in no distress.  Cardiovascular: Normal rate and regular rhythm.   Pulmonary/Chest: Effort normal and breath sounds normal.  Abdominal: Soft. Bowel sounds are normal.  Musculoskeletal:       Right ankle: She exhibits no swelling.       Left ankle: She exhibits no swelling.  Neurological: She is alert and oriented to person, place, and time.  Nursing note and vitals reviewed.     Assessment & Plan  1. Essential hypertension Blood pressure stable and controlled on present therapy - hydrochlorothiazide (HYDRODIURIL) 25 MG tablet; Take 1 tablet (25 mg total) by mouth daily.  Dispense:  90 tablet; Refill: 0  2. Hyperlipidemia Elevated LDL, and repeat FLP. - Lipid Profile - Comprehensive Metabolic Panel (CMET)   Yuepheng Schaller [facility] 08/28/2015 11:10 AM

## 2015-08-29 LAB — COMPREHENSIVE METABOLIC PANEL
ALT: 17 IU/L (ref 0–32)
AST: 17 IU/L (ref 0–40)
Albumin/Globulin Ratio: 1.4 (ref 1.2–2.2)
Albumin: 4.2 g/dL (ref 3.5–4.8)
Alkaline Phosphatase: 94 IU/L (ref 39–117)
BUN/Creatinine Ratio: 17 (ref 12–28)
BUN: 19 mg/dL (ref 8–27)
Bilirubin Total: 0.7 mg/dL (ref 0.0–1.2)
CO2: 29 mmol/L (ref 18–29)
Calcium: 9.9 mg/dL (ref 8.7–10.3)
Chloride: 96 mmol/L (ref 96–106)
Creatinine, Ser: 1.12 mg/dL — ABNORMAL HIGH (ref 0.57–1.00)
GFR calc Af Amer: 55 mL/min/{1.73_m2} — ABNORMAL LOW (ref >59)
GFR calc non Af Amer: 48 mL/min/{1.73_m2} — ABNORMAL LOW (ref >59)
Globulin, Total: 3.1 g/dL (ref 1.5–4.5)
Glucose: 109 mg/dL — ABNORMAL HIGH (ref 65–99)
Potassium: 4.1 mmol/L (ref 3.5–5.2)
Sodium: 140 mmol/L (ref 134–144)
Total Protein: 7.3 g/dL (ref 6.0–8.5)

## 2015-08-29 LAB — LIPID PANEL
Chol/HDL Ratio: 3.7 ratio units (ref 0.0–4.4)
Cholesterol, Total: 183 mg/dL (ref 100–199)
HDL: 50 mg/dL (ref >39)
LDL Calculated: 110 mg/dL — ABNORMAL HIGH (ref 0–99)
Triglycerides: 117 mg/dL (ref 0–149)
VLDL Cholesterol Cal: 23 mg/dL (ref 5–40)

## 2015-09-04 ENCOUNTER — Ambulatory Visit: Payer: Medicare Other | Admitting: Family Medicine

## 2015-09-18 ENCOUNTER — Ambulatory Visit: Payer: Medicare Other | Admitting: Sports Medicine

## 2015-10-10 ENCOUNTER — Telehealth: Payer: Self-pay | Admitting: Family Medicine

## 2015-10-10 MED ORDER — ATORVASTATIN CALCIUM 40 MG PO TABS: 40.0000 mg | ORAL_TABLET | Freq: Every day | ORAL | Status: DC

## 2015-10-10 NOTE — Telephone Encounter (Signed)
Medication has been refilled and sent to [facility] [address] 

## 2015-10-10 NOTE — Telephone Encounter (Signed)
Per Dr. Manuella Ghazi request patient has increased her atorvastatin from 20mg  to 40mg .  Patient is now down to 3 pills and is needing a refill sent to the [facility] on [address].  Please call patient once complete.

## 2015-11-28 ENCOUNTER — Encounter: Payer: Self-pay | Admitting: Family Medicine

## 2015-11-28 ENCOUNTER — Ambulatory Visit (INDEPENDENT_AMBULATORY_CARE_PROVIDER_SITE_OTHER): Payer: Medicare Other | Admitting: Family Medicine

## 2015-11-28 VITALS — BP 118/80 | HR 110 | Temp 98.7°F | Resp 16 | Ht 64.0 in | Wt 229.0 lb

## 2015-11-28 DIAGNOSIS — N183 Chronic kidney disease, stage 3 unspecified: Secondary | ICD-10-CM

## 2015-11-28 DIAGNOSIS — E785 Hyperlipidemia, unspecified: Secondary | ICD-10-CM

## 2015-11-28 DIAGNOSIS — E1122 Type 2 diabetes mellitus with diabetic chronic kidney disease: Secondary | ICD-10-CM | POA: Insufficient documentation

## 2015-11-28 DIAGNOSIS — R7309 Other abnormal glucose: Secondary | ICD-10-CM | POA: Diagnosis not present

## 2015-11-28 DIAGNOSIS — I10 Essential (primary) hypertension: Secondary | ICD-10-CM | POA: Diagnosis not present

## 2015-11-28 LAB — COMPLETE METABOLIC PANEL WITH GFR
ALT: 13 U/L (ref 6–29)
AST: 14 U/L (ref 10–35)
Albumin: 4 g/dL (ref 3.6–5.1)
Alkaline Phosphatase: 81 U/L (ref 33–130)
BUN: 15 mg/dL (ref 7–25)
CO2: 28 mmol/L (ref 20–31)
Calcium: 10 mg/dL (ref 8.6–10.4)
Chloride: 101 mmol/L (ref 98–110)
Creat: 1.14 mg/dL — ABNORMAL HIGH (ref 0.60–0.93)
GFR, Est African American: 54 mL/min — ABNORMAL LOW (ref >=60)
GFR, Est Non African American: 47 mL/min — ABNORMAL LOW (ref >=60)
Glucose, Bld: 106 mg/dL — ABNORMAL HIGH (ref 65–99)
Potassium: 3.9 mmol/L (ref 3.5–5.3)
Sodium: 139 mmol/L (ref 135–146)
Total Bilirubin: 0.8 mg/dL (ref 0.2–1.2)
Total Protein: 7 g/dL (ref 6.1–8.1)

## 2015-11-28 LAB — POCT GLYCOSYLATED HEMOGLOBIN (HGB A1C): Hemoglobin A1C: 6.4

## 2015-11-28 LAB — LIPID PANEL
Cholesterol: 160 mg/dL (ref 125–200)
HDL: 50 mg/dL (ref >=46)
LDL Cholesterol: 92 mg/dL (ref <130)
Total CHOL/HDL Ratio: 3.2 Ratio (ref <=5.0)
Triglycerides: 91 mg/dL (ref <150)
VLDL: 18 mg/dL (ref <30)

## 2015-11-28 MED ORDER — ATORVASTATIN CALCIUM 40 MG PO TABS: 40.0000 mg | ORAL_TABLET | Freq: Every day | ORAL | 0 refills | Status: DC

## 2015-11-28 MED ORDER — HYDROCHLOROTHIAZIDE 25 MG PO TABS: 25.0000 mg | ORAL_TABLET | Freq: Every day | ORAL | 0 refills | Status: DC

## 2015-11-28 NOTE — Progress Notes (Signed)
Name: Tammy Lozano   MRN: [id number]    DOB: 10-18-1940   Date:11/28/2015       Progress Note  Subjective  Chief Complaint  Chief Complaint  Patient presents with  . Hyperlipidemia    3  motnh follow up  . Hypertension    Hyperlipidemia  This is a chronic problem. The problem is controlled. Exacerbating diseases include obesity. She has no history of diabetes. Pertinent negatives include no chest pain, myalgias or shortness of breath. Current antihyperlipidemic treatment includes statins.  Hypertension  This is a chronic problem. The problem is unchanged. The problem is controlled. Pertinent negatives include no blurred vision, chest pain, headaches, palpitations or shortness of breath. Past treatments include diuretics. Hypertensive end-organ damage includes kidney disease (being followed by Nephrology.). There is no history of CAD/MI or CVA.     Past Medical History:  Diagnosis Date  . Hyperlipidemia   . Hypertension   . Osteoarthritis     Past Surgical History:  Procedure Laterality Date  . KNEE SURGERY  2010    Family History  Problem Relation Age of Onset  . Cancer Mother   . Cancer Father     Social History   Social History  . Marital status: Divorced    Spouse name: N/A  . Number of children: N/A  . Years of education: N/A   Occupational History  . Not on file.   Social History Main Topics  . Smoking status: Former Smoker    Types: Cigarettes  . Smokeless tobacco: Not on file  . Alcohol use No  . Drug use: No  . Sexual activity: No   Other Topics Concern  . Not on file   Social History Narrative  . No narrative on file     Current Outpatient Prescriptions:  .  atorvastatin (LIPITOR) 40 MG tablet, Take 1 tablet (40 mg total) by mouth daily., Disp: 90 tablet, Rfl: 0 .  Cholecalciferol (VITAMIN D-3 PO), Take by mouth., Disp: , Rfl:  .  hydrochlorothiazide (HYDRODIURIL) 25 MG tablet, Take 1 tablet (25 mg total) by mouth daily., Disp: 90 tablet,  Rfl: 0 .  Omega-3 Fatty Acids (FISH OIL) 1000 MG CAPS, Take 1,000 mg by mouth., Disp: , Rfl:  .  Potassium Gluconate 595 MG CAPS, Take by mouth., Disp: , Rfl:  .  fluticasone (FLONASE) 50 MCG/ACT nasal spray, , Disp: , Rfl:   Not on File   Review of Systems  Eyes: Negative for blurred vision.  Respiratory: Negative for shortness of breath.   Cardiovascular: Negative for chest pain and palpitations.  Musculoskeletal: Negative for myalgias.  Neurological: Negative for headaches.      Objective  Vitals:   11/28/15 0958  BP: 118/80  Pulse: (!) 110  Resp: 16  Temp: 98.7 F (37.1 C)  TempSrc: Oral  SpO2: 94%  Weight: 229 lb (103.9 kg)  Height: 5\' 4"  (1.626 m)    Physical Exam  Constitutional: She is oriented to person, place, and time and well-developed, well-nourished, and in no distress.  Cardiovascular: Normal rate, regular rhythm, S1 normal, S2 normal and normal heart sounds.   No murmur heard. Pulmonary/Chest: Effort normal and breath sounds normal. She has no wheezes.  Abdominal: Soft. Bowel sounds are normal. There is no tenderness.  Musculoskeletal: Normal range of motion. She exhibits no edema.       Right ankle: She exhibits no swelling.       Left ankle: She exhibits no swelling.  Neurological: She is  alert and oriented to person, place, and time.  Psychiatric: Mood, memory, affect and judgment normal.  Nursing note and vitals reviewed.    Assessment & Plan  1. Essential hypertension  - hydrochlorothiazide (HYDRODIURIL) 25 MG tablet; Take 1 tablet (25 mg total) by mouth daily.  Dispense: 90 tablet; Refill: 0  2. Hyperlipidemia  - atorvastatin (LIPITOR) 40 MG tablet; Take 1 tablet (40 mg total) by mouth daily.  Dispense: 90 tablet; Refill: 0 - Lipid Profile - COMPLETE METABOLIC PANEL WITH GFR  3. Elevated hemoglobin A1c  - POCT HgB A1C  4. CKD (chronic kidney disease) stage 3, GFR 30-59 ml/min Followed by nephrology, we'll obtain an updated GFR  today.  Tammy Lozano Tammy Lozano 11/28/2015 10:15 AM

## 2016-02-21 ENCOUNTER — Encounter: Payer: Self-pay | Admitting: Family Medicine

## 2016-02-21 ENCOUNTER — Ambulatory Visit (INDEPENDENT_AMBULATORY_CARE_PROVIDER_SITE_OTHER): Payer: Medicare Other | Admitting: Family Medicine

## 2016-02-21 DIAGNOSIS — E785 Hyperlipidemia, unspecified: Secondary | ICD-10-CM | POA: Diagnosis not present

## 2016-02-21 DIAGNOSIS — I10 Essential (primary) hypertension: Secondary | ICD-10-CM

## 2016-02-21 MED ORDER — ATORVASTATIN CALCIUM 40 MG PO TABS: 40.0000 mg | ORAL_TABLET | Freq: Every day | ORAL | 0 refills | Status: DC

## 2016-02-21 MED ORDER — HYDROCHLOROTHIAZIDE 25 MG PO TABS: 25.0000 mg | ORAL_TABLET | Freq: Every day | ORAL | 0 refills | Status: DC

## 2016-02-21 NOTE — Progress Notes (Signed)
Name: Tammy Lozano   MRN: [id number]    DOB: Jan 16, 1941   Date:02/21/2016       Progress Note  Subjective  Chief Complaint  Chief Complaint  Patient presents with  . Follow-up    Hyperlipidemia  This is a chronic problem. The problem is controlled. Exacerbating diseases include obesity. She has no history of diabetes. Pertinent negatives include no chest pain, leg pain, myalgias or shortness of breath. Current antihyperlipidemic treatment includes statins.  Hypertension  This is a chronic problem. The problem is unchanged. The problem is controlled. Pertinent negatives include no blurred vision, chest pain, headaches, palpitations or shortness of breath. Past treatments include diuretics. Hypertensive end-organ damage includes kidney disease (being followed by Nephrology.). There is no history of CAD/MI or CVA.     Past Medical History:  Diagnosis Date  . Hyperlipidemia   . Hypertension   . Osteoarthritis     Past Surgical History:  Procedure Laterality Date  . KNEE SURGERY  2010    Family History  Problem Relation Age of Onset  . Cancer Mother   . Cancer Father     Social History   Social History  . Marital status: Divorced    Spouse name: N/A  . Number of children: N/A  . Years of education: N/A   Occupational History  . Not on file.   Social History Main Topics  . Smoking status: Former Smoker    Types: Cigarettes  . Smokeless tobacco: Not on file  . Alcohol use No  . Drug use: No  . Sexual activity: No   Other Topics Concern  . Not on file   Social History Narrative  . No narrative on file     Current Outpatient Prescriptions:  .  atorvastatin (LIPITOR) 40 MG tablet, Take 1 tablet (40 mg total) by mouth daily., Disp: 90 tablet, Rfl: 0 .  Cholecalciferol (VITAMIN D-3 PO), Take by mouth., Disp: , Rfl:  .  fluticasone (FLONASE) 50 MCG/ACT nasal spray, , Disp: , Rfl:  .  hydrochlorothiazide (HYDRODIURIL) 25 MG tablet, Take 1 tablet (25 mg total) by  mouth daily., Disp: 90 tablet, Rfl: 0 .  Omega-3 Fatty Acids (FISH OIL) 1000 MG CAPS, Take 1,000 mg by mouth., Disp: , Rfl:  .  Potassium Gluconate 595 MG CAPS, Take by mouth., Disp: , Rfl:   Not on File   Review of Systems  Eyes: Negative for blurred vision.  Respiratory: Negative for shortness of breath.   Cardiovascular: Negative for chest pain and palpitations.  Musculoskeletal: Negative for myalgias.  Neurological: Negative for headaches.    Objective  Vitals:   02/21/16 1006  BP: 112/80  Pulse: (!) 109  Temp: 98.3 F (36.8 C)  SpO2: 95%  Weight: 223 lb (101.2 kg)    Physical Exam  Constitutional: She is oriented to person, place, and time and well-developed, well-nourished, and in no distress.  HENT:  Head: Normocephalic and atraumatic.  Cardiovascular: Normal rate, regular rhythm, S1 normal, S2 normal and normal heart sounds.   No murmur heard. Pulmonary/Chest: Effort normal and breath sounds normal. She has no wheezes.  Abdominal: Soft. Bowel sounds are normal. There is no tenderness.  Musculoskeletal: Normal range of motion. She exhibits no edema.       Right ankle: She exhibits no swelling.       Left ankle: She exhibits no swelling.  Neurological: She is alert and oriented to person, place, and time.  Psychiatric: Mood, memory, affect and judgment normal.  Nursing note and vitals reviewed.     Assessment & Plan  1. Essential hypertension BP stable on present antihypertensive therapy - hydrochlorothiazide (HYDRODIURIL) 25 MG tablet; Take 1 tablet (25 mg total) by mouth daily.  Dispense: 90 tablet; Refill: 0  2. Hyperlipidemia, unspecified hyperlipidemia type  - atorvastatin (LIPITOR) 40 MG tablet; Take 1 tablet (40 mg total) by mouth daily.  Dispense: 90 tablet; Refill: 0   Crystalmarie Yasin [facility] 02/21/2016 10:19 AM

## 2016-03-17 ENCOUNTER — Ambulatory Visit: Payer: Medicare Other | Admitting: Family Medicine

## 2016-03-17 ENCOUNTER — Ambulatory Visit (INDEPENDENT_AMBULATORY_CARE_PROVIDER_SITE_OTHER): Payer: Medicare Other

## 2016-03-17 ENCOUNTER — Encounter: Payer: Self-pay | Admitting: Family Medicine

## 2016-03-17 VITALS — BP 114/73 | HR 100 | Temp 99.0°F | Wt 224.5 lb

## 2016-03-17 DIAGNOSIS — Z0000 Encounter for general adult medical examination without abnormal findings: Secondary | ICD-10-CM | POA: Diagnosis not present

## 2016-03-17 NOTE — Progress Notes (Signed)
Subjective:   Tammy Lozano is a 75 y.o. female who presents for an Initial Medicare Annual Wellness Visit.  Review of Systems    N/A  Cardiac Risk Factors include: advanced age (>42men, >27 women);dyslipidemia;hypertension;obesity (BMI >30kg/m2)     Objective:    Today's Vitals   03/17/16 1454 03/17/16 1455  BP: 114/73   Pulse: 100   Temp: 99 F (37.2 C)   TempSrc: Oral   Weight: 224 lb 8 oz (101.8 kg)   PainSc: 5  5   PainLoc: Hip    Body mass index is 38.54 kg/m.   Current Medications (verified) Outpatient Encounter Prescriptions as of 03/17/2016  Medication Sig  . atorvastatin (LIPITOR) 40 MG tablet Take 1 tablet (40 mg total) by mouth daily.  . Cholecalciferol (VITAMIN D-3 PO) Take by mouth.  . fluticasone (FLONASE) 50 MCG/ACT nasal spray   . hydrochlorothiazide (HYDRODIURIL) 25 MG tablet Take 1 tablet (25 mg total) by mouth daily.  . Omega-3 Fatty Acids (FISH OIL) 1000 MG CAPS Take 1,000 mg by mouth.  . Potassium Gluconate 595 MG CAPS Take by mouth.   No facility-administered encounter medications on file as of 03/17/2016.     Allergies (verified) Patient has no allergy information on record.   History: Past Medical History:  Diagnosis Date  . Hyperlipidemia   . Hypertension   . Osteoarthritis    Past Surgical History:  Procedure Laterality Date  . KNEE SURGERY  2010   Family History  Problem Relation Age of Onset  . Cancer Mother     lung  . Cancer Father     brain  . Cancer Brother   . Cancer Brother     esophagus   Social History   Occupational History  . Not on file.   Social History Main Topics  . Smoking status: Former Smoker    Types: Cigarettes  . Smokeless tobacco: Never Used     Comment: quit in 2002  . Alcohol use No  . Drug use: No  . Sexual activity: No    Tobacco Counseling Counseling given: Not Answered   Activities of Daily Living In your present state of health, do you have any difficulty performing the  following activities: 03/17/2016 03/17/2016  Hearing? N N  Vision? N Y  Difficulty concentrating or making decisions? N N  Walking or climbing stairs? Y Y  Dressing or bathing? N N  Doing errands, shopping? N N  Preparing Food and eating ? N -  Using the Toilet? N -  In the past six months, have you accidently leaked urine? N -  Do you have problems with loss of bowel control? N -  Managing your Medications? N -  Managing your Finances? N -  Housekeeping or managing your Housekeeping? N -  Some recent data might be hidden    Immunizations and Health Maintenance Immunization History  Administered Date(s) Administered  . Pneumococcal Polysaccharide-23 09/04/2011   There are no preventive care reminders to display for this patient.  Patient Care Team: Roselee Nova, MD as PCP - General (Family Medicine)  Indicate any recent Medical Services you may have received from other than [facility] providers in the past year (date may be approximate).     Assessment:   This is a routine wellness examination for Tammy Lozano.   Hearing/Vision screen Vision Screening Comments: Pt does not follow up with an eye doctor on a regular basis.  Dietary issues and exercise activities discussed: Current Exercise  Habits: The patient does not participate in regular exercise at present, Exercise limited by: orthopedic condition(s)  Goals    . Increase water intake          Starting 03/17/16, I will increase my water intake to 4 glasses a day.      Depression Screen PHQ 2/9 Scores 03/17/2016 03/17/2016 02/21/2016 08/28/2015 06/05/2015 05/08/2015 03/06/2015  PHQ - 2 Score 0 0 0 0 0 0 0    Fall Risk Fall Risk  03/17/2016 03/17/2016 02/21/2016 08/28/2015 06/05/2015  Falls in the past year? No No No No No    Cognitive Function:     6CIT Screen 03/17/2016  What Year? 0 points  What month? 0 points  What time? 0 points  Count back from 20 0 points  Months in reverse 0 points  Repeat phrase 0 points    Total Score 0    Screening Tests Health Maintenance  Topic Date Due  . INFLUENZA VACCINE  03/17/2017 (Originally 11/06/2015)  . PNA vac Low Risk Adult (1 of 2 - PCV13) 03/17/2017 (Originally 05/09/2004)  . DEXA SCAN  03/17/2026 (Originally 05/09/2004)  . ZOSTAVAX  03/17/2026 (Originally 05/10/1999)  . TETANUS/TDAP  01/25/2024      Plan:  I have personally reviewed and addressed the Medicare Annual Wellness questionnaire and have noted the following in the patient's chart:  A. Medical and social history B. Use of alcohol, tobacco or illicit drugs  C. Current medications and supplements D. Functional ability and status E.  Nutritional status F.  Physical activity G. Advance directives H. List of other physicians I.  Hospitalizations, surgeries, and ER visits in previous 12 months J.  [place] such as hearing and vision if needed, cognitive and depression L. Referrals and appointments - none  In addition, I have reviewed and discussed with patient certain preventive protocols, quality metrics, and best practice recommendations. A written personalized care plan for preventive services as well as general preventive health recommendations were provided to patient.  See attached scanned questionnaire for additional information.   Signed,  Fabio Neighbors, LPN Nurse Health Advisor   MD Recommendation: none. I, as supervising physician, have reviewed the nurse health advisor's Medicare Wellness Visit note for this patient and concur with the findings and recommendations listed above.  Signed Syed Asad A. Manuella Ghazi MD Attending Physician.

## 2016-03-17 NOTE — Patient Instructions (Signed)
Tammy Lozano , Thank you for taking time to come for your Medicare Wellness Visit. I appreciate your ongoing commitment to your health goals. Please review the following plan we discussed and let me know if I can assist you in the future.   These are the goals we discussed: Goals    . Increase water intake          Starting 03/17/16, I will increase my water intake to 4 glasses a day.       This is a list of the screening recommended for you and due dates:  Health Maintenance  Topic Date Due  . Tetanus Vaccine  05/09/1958  . Shingles Vaccine  05/10/1999  . DEXA scan (bone density measurement)  05/09/2004  . Pneumonia vaccines (1 of 2 - PCV13) 05/09/2004  . Flu Shot  11/06/2015   Preventive Care for Adults  A healthy lifestyle and preventive care can promote health and wellness. Preventive health guidelines for adults include the following key practices.  . A routine yearly physical is a good way to check with your health care provider about your health and preventive screening. It is a chance to share any concerns and updates on your health and to receive a thorough exam.  . Visit your dentist for a routine exam and preventive care every 6 months. Brush your teeth twice a day and floss once a day. Good oral hygiene prevents tooth decay and gum disease.  . The frequency of eye exams is based on your age, health, family medical history, use  of contact lenses, and other factors. Follow your health care provider's ecommendations for frequency of eye exams.  . Eat a healthy diet. Foods like vegetables, fruits, whole grains, low-fat dairy products, and lean protein foods contain the nutrients you need without too many calories. Decrease your intake of foods high in solid fats, added sugars, and salt. Eat the right amount of calories for you. Get information about a proper diet from your health care provider, if necessary.  . Regular physical exercise is one of the most important things  you can do for your health. Most adults should get at least 150 minutes of moderate-intensity exercise (any activity that increases your heart rate and causes you to sweat) each week. In addition, most adults need muscle-strengthening exercises on 2 or more days a week.  Silver Sneakers may be a benefit available to you. To determine eligibility, you may visit the website: www.silversneakers.com or contact program at 719-351-7284 Mon-Fri between 8AM-8PM.   . Maintain a healthy weight. The body mass index (BMI) is a screening tool to identify possible weight problems. It provides an estimate of body fat based on height and weight. Your health care provider can find your BMI and can help you achieve or maintain a healthy weight.   For adults 20 years and older: ? A BMI below 18.5 is considered underweight. ? A BMI of 18.5 to 24.9 is normal. ? A BMI of 25 to 29.9 is considered overweight. ? A BMI of 30 and above is considered obese.   . Maintain normal blood lipids and cholesterol levels by exercising and minimizing your intake of saturated fat. Eat a balanced diet with plenty of fruit and vegetables. Blood tests for lipids and cholesterol should begin at age 99 and be repeated every 5 years. If your lipid or cholesterol levels are high, you are over 50, or you are at high risk for heart disease, you may need your cholesterol levels  checked more frequently. Ongoing high lipid and cholesterol levels should be treated with medicines if diet and exercise are not working.  . If you smoke, find out from your health care provider how to quit. If you do not use tobacco, please do not start.  . If you choose to drink alcohol, please do not consume more than 2 drinks per day. One drink is considered to be 12 ounces (355 mL) of beer, 5 ounces (148 mL) of wine, or 1.5 ounces (44 mL) of liquor.  . If you are 71-50 years old, ask your health care provider if you should take aspirin to prevent strokes.  . Use  sunscreen. Apply sunscreen liberally and repeatedly throughout the day. You should seek shade when your shadow is shorter than you. Protect yourself by wearing long sleeves, pants, a wide-brimmed hat, and sunglasses year round, whenever you are outdoors.  . Once a month, do a whole body skin exam, using a mirror to look at the skin on your back. Tell your health care provider of new moles, moles that have irregular borders, moles that are larger than a pencil eraser, or moles that have changed in shape or color.

## 2016-03-17 NOTE — Progress Notes (Signed)
Name: Tammy Lozano   MRN: [id number]    DOB: [birth year]   Date:03/17/2016       Progress Note  Subjective  Chief Complaint  Chief Complaint  Patient presents with  . Annual Exam    CPE    Hyperlipidemia  This is a chronic problem. The problem is controlled. Exacerbating diseases include obesity. She has no history of diabetes. Pertinent negatives include no leg pain or myalgias. Current antihyperlipidemic treatment includes statins.    This encounter was created in error - please disregard.  Past Medical History:  Diagnosis Date  . Hyperlipidemia   . Hypertension   . Osteoarthritis     Past Surgical History:  Procedure Laterality Date  . KNEE SURGERY  2010    Family History  Problem Relation Age of Onset  . Cancer Mother     lung  . Cancer Father     brain  . Cancer Brother   . Cancer Brother     esophagus    Social History   Social History  . Marital status: Divorced    Spouse name: N/A  . Number of children: N/A  . Years of education: N/A   Occupational History  . Not on file.   Social History Main Topics  . Smoking status: Former Smoker    Types: Cigarettes  . Smokeless tobacco: Never Used     Comment: quit in 2002  . Alcohol use No  . Drug use: No  . Sexual activity: No   Other Topics Concern  . Not on file   Social History Narrative  . No narrative on file     Current Outpatient Prescriptions:  .  atorvastatin (LIPITOR) 40 MG tablet, Take 1 tablet (40 mg total) by mouth daily., Disp: 90 tablet, Rfl: 0 .  Cholecalciferol (VITAMIN D-3 PO), Take by mouth., Disp: , Rfl:  .  fluticasone (FLONASE) 50 MCG/ACT nasal spray, , Disp: , Rfl:  .  hydrochlorothiazide (HYDRODIURIL) 25 MG tablet, Take 1 tablet (25 mg total) by mouth daily., Disp: 90 tablet, Rfl: 0 .  Omega-3 Fatty Acids (FISH OIL) 1000 MG CAPS, Take 1,000 mg by mouth., Disp: , Rfl:  .  Potassium Gluconate 595 MG CAPS, Take by mouth., Disp: , Rfl:   Not on File   Review of Systems   Musculoskeletal: Negative for myalgias.      Objective  Vitals:   03/17/16 1354  BP: 114/73  Pulse: 100  Resp: 17  Temp: 99 F (37.2 C)  TempSrc: Oral  SpO2: 96%  Weight: 224 lb 12.8 oz (102 kg)  Height: 5\' 4"  (1.626 m)    Physical Exam     No results found for this or any previous visit (from the past 2160 hour(s)).   Assessment & Plan  There are no diagnoses linked to this encounter.  Kimber Esterly [facility] 03/17/2016 3:30 PM

## 2016-04-14 ENCOUNTER — Ambulatory Visit: Payer: Medicare Other

## 2016-04-26 ENCOUNTER — Other Ambulatory Visit: Payer: Self-pay | Admitting: Family Medicine

## 2016-04-26 DIAGNOSIS — I10 Essential (primary) hypertension: Secondary | ICD-10-CM

## 2016-04-28 ENCOUNTER — Telehealth: Payer: Self-pay | Admitting: Family Medicine

## 2016-04-28 NOTE — Telephone Encounter (Signed)
Pt is out of her blood pressure medication. Pharm is [facility] on [address]. Pt is asking for 90 day supply

## 2016-04-29 NOTE — Telephone Encounter (Signed)
Returned patient call and she has been notified that her medication has been sent to [address] on 04/26/2016 by Dr. Manuella Ghazi, and is ready for pick up

## 2016-06-10 ENCOUNTER — Ambulatory Visit: Payer: Medicare Other | Admitting: Family Medicine

## 2016-06-13 ENCOUNTER — Ambulatory Visit (INDEPENDENT_AMBULATORY_CARE_PROVIDER_SITE_OTHER): Payer: Medicare Other | Admitting: Family Medicine

## 2016-06-13 ENCOUNTER — Encounter: Payer: Self-pay | Admitting: Family Medicine

## 2016-06-13 VITALS — BP 116/71 | HR 104 | Temp 98.0°F | Resp 17 | Ht 64.0 in | Wt 226.2 lb

## 2016-06-13 DIAGNOSIS — Z780 Asymptomatic menopausal state: Secondary | ICD-10-CM | POA: Diagnosis not present

## 2016-06-13 DIAGNOSIS — E7800 Pure hypercholesterolemia, unspecified: Secondary | ICD-10-CM | POA: Diagnosis not present

## 2016-06-13 LAB — LIPID PANEL
Cholesterol: 159 mg/dL (ref <200)
HDL: 50 mg/dL — ABNORMAL LOW (ref >50)
LDL Cholesterol: 94 mg/dL (ref <100)
Total CHOL/HDL Ratio: 3.2 Ratio (ref <5.0)
Triglycerides: 77 mg/dL (ref <150)
VLDL: 15 mg/dL (ref <30)

## 2016-06-13 NOTE — Progress Notes (Signed)
Name: Tammy Lozano   MRN: [id number]    DOB: Feb 07, 1941   Date:06/13/2016       Progress Note  Subjective  Chief Complaint  Chief Complaint  Patient presents with  . Follow-up    Hyperlipidemia  This is a chronic problem. The problem is controlled. Exacerbating diseases include obesity. She has no history of diabetes. Pertinent negatives include no leg pain or myalgias. Current antihyperlipidemic treatment includes statins.   Pt. Is due for a DEXA Scan, last scan was many years ago when she was living in [place], results are not available. She is post menopausal, no history of Osteoporosis know.     Past Medical History:  Diagnosis Date  . Hyperlipidemia   . Hypertension   . Osteoarthritis     Past Surgical History:  Procedure Laterality Date  . KNEE SURGERY  2010    Family History  Problem Relation Age of Onset  . Cancer Mother     lung  . Cancer Father     brain  . Cancer Brother   . Cancer Brother     esophagus    Social History   Social History  . Marital status: Divorced    Spouse name: N/A  . Number of children: N/A  . Years of education: N/A   Occupational History  . Not on file.   Social History Main Topics  . Smoking status: Former Smoker    Types: Cigarettes  . Smokeless tobacco: Never Used     Comment: quit in 2002  . Alcohol use No  . Drug use: No  . Sexual activity: No   Other Topics Concern  . Not on file   Social History Narrative  . No narrative on file     Current Outpatient Prescriptions:  .  atorvastatin (LIPITOR) 40 MG tablet, Take 1 tablet (40 mg total) by mouth daily., Disp: 90 tablet, Rfl: 0 .  Cholecalciferol (VITAMIN D-3 PO), Take by mouth., Disp: , Rfl:  .  fluticasone (FLONASE) 50 MCG/ACT nasal spray, , Disp: , Rfl:  .  hydrochlorothiazide (HYDRODIURIL) 25 MG tablet, TAKE ONE TABLET BY MOUTH ONCE DAILY, Disp: 90 tablet, Rfl: 0 .  Omega-3 Fatty Acids (FISH OIL) 1000 MG CAPS, Take 1,000 mg by mouth., Disp: , Rfl:   .  Potassium Gluconate 595 MG CAPS, Take by mouth., Disp: , Rfl:   Not on File   Review of Systems  Musculoskeletal: Negative for myalgias.     Objective  Vitals:   06/13/16 1151  BP: 116/71  Pulse: (!) 104  Resp: 17  Temp: 98 F (36.7 C)  TempSrc: Oral  SpO2: 96%  Weight: 226 lb 3.2 oz (102.6 kg)  Height: 5\' 4"  (1.626 m)    Physical Exam  Constitutional: She is oriented to person, place, and time and well-developed, well-nourished, and in no distress.  Cardiovascular: Normal rate, regular rhythm, S1 normal, S2 normal and normal heart sounds.   No murmur heard. Pulmonary/Chest: Effort normal and breath sounds normal. She has no wheezes.  Abdominal: Soft. Bowel sounds are normal. There is no tenderness.  Musculoskeletal: Normal range of motion. She exhibits no edema.       Right ankle: She exhibits no swelling.       Left ankle: She exhibits no swelling.  Neurological: She is alert and oriented to person, place, and time.  Psychiatric: Mood, memory, affect and judgment normal.  Nursing note and vitals reviewed.   Assessment & Plan 1. Pure hypercholesterolemia Obtain  FLP, continue on statin - Lipid panel  2. Post-menopausal  - DG Bone Density; Future   [name] Asad A. [facility] 06/13/2016 12:04 PM

## 2016-07-15 ENCOUNTER — Other Ambulatory Visit: Payer: Self-pay | Admitting: Emergency Medicine

## 2016-07-15 ENCOUNTER — Telehealth: Payer: Self-pay | Admitting: Family Medicine

## 2016-07-15 DIAGNOSIS — E785 Hyperlipidemia, unspecified: Secondary | ICD-10-CM

## 2016-07-15 MED ORDER — ATORVASTATIN CALCIUM 40 MG PO TABS: 40.0000 mg | ORAL_TABLET | Freq: Every day | ORAL | 0 refills | Status: DC

## 2016-07-15 NOTE — Telephone Encounter (Signed)
Pt needs refill on Atorvastatin. Pt needs 90 day supply to be sent to [place]. Pt has an appt for a 6 mth fu in Sept.

## 2016-07-22 ENCOUNTER — Telehealth: Payer: Self-pay | Admitting: Family Medicine

## 2016-07-22 NOTE — Telephone Encounter (Signed)
Pt asking for refill on Hydrochlorothiazide 25mg . Is needing 90 day supply and has an appt in sept as askied. Devin Going is Paediatric nurse on [address]

## 2016-07-24 ENCOUNTER — Other Ambulatory Visit: Payer: Self-pay | Admitting: Emergency Medicine

## 2016-07-24 DIAGNOSIS — I10 Essential (primary) hypertension: Secondary | ICD-10-CM

## 2016-07-24 MED ORDER — HYDROCHLOROTHIAZIDE 25 MG PO TABS: 25.0000 mg | ORAL_TABLET | Freq: Every day | ORAL | 0 refills | Status: DC

## 2016-07-24 NOTE — Telephone Encounter (Signed)
Script sent  

## 2016-08-04 DIAGNOSIS — N183 Chronic kidney disease, stage 3 (moderate): Secondary | ICD-10-CM | POA: Diagnosis not present

## 2016-08-04 DIAGNOSIS — I10 Essential (primary) hypertension: Secondary | ICD-10-CM | POA: Diagnosis not present

## 2016-08-11 ENCOUNTER — Encounter: Payer: Self-pay | Admitting: Family Medicine

## 2016-10-09 ENCOUNTER — Other Ambulatory Visit: Payer: Self-pay | Admitting: Family Medicine

## 2016-10-09 DIAGNOSIS — I10 Essential (primary) hypertension: Secondary | ICD-10-CM

## 2016-10-09 DIAGNOSIS — E785 Hyperlipidemia, unspecified: Secondary | ICD-10-CM

## 2016-10-09 MED ORDER — HYDROCHLOROTHIAZIDE 25 MG PO TABS: 25.0000 mg | ORAL_TABLET | Freq: Every day | ORAL | 0 refills | Status: DC

## 2016-10-09 MED ORDER — ATORVASTATIN CALCIUM 40 MG PO TABS: 40.0000 mg | ORAL_TABLET | Freq: Every day | ORAL | 0 refills | Status: DC

## 2016-10-09 NOTE — Telephone Encounter (Signed)
Pt has her 58m follow up visit scheduled for sept. She is asking for a refill on hydrochlorothiazide 25mg  and atorvastatin 40mg . Please send to [facility]

## 2016-12-15 ENCOUNTER — Ambulatory Visit (INDEPENDENT_AMBULATORY_CARE_PROVIDER_SITE_OTHER): Payer: Medicare Other | Admitting: Family Medicine

## 2016-12-15 ENCOUNTER — Encounter: Payer: Self-pay | Admitting: Family Medicine

## 2016-12-15 VITALS — BP 118/74 | HR 110 | Temp 98.5°F | Resp 17 | Ht 64.0 in | Wt 223.0 lb

## 2016-12-15 DIAGNOSIS — E785 Hyperlipidemia, unspecified: Secondary | ICD-10-CM | POA: Diagnosis not present

## 2016-12-15 DIAGNOSIS — E119 Type 2 diabetes mellitus without complications: Secondary | ICD-10-CM | POA: Diagnosis not present

## 2016-12-15 DIAGNOSIS — I10 Essential (primary) hypertension: Secondary | ICD-10-CM | POA: Diagnosis not present

## 2016-12-15 LAB — POCT GLYCOSYLATED HEMOGLOBIN (HGB A1C): Hemoglobin A1C: 6.6

## 2016-12-15 MED ORDER — HYDROCHLOROTHIAZIDE 25 MG PO TABS: 25.0000 mg | ORAL_TABLET | Freq: Every day | ORAL | 0 refills | Status: DC

## 2016-12-15 MED ORDER — ATORVASTATIN CALCIUM 40 MG PO TABS: 40.0000 mg | ORAL_TABLET | Freq: Every day | ORAL | 0 refills | Status: DC

## 2016-12-15 NOTE — Progress Notes (Signed)
Name: Tammy Lozano   MRN: [id number]    DOB: 08-20-1940   Date:12/15/2016       Progress Note  Subjective  Chief Complaint  Chief Complaint  Patient presents with  . Follow-up    6 mo  . Hyperlipidemia  . Hypertension    Hyperlipidemia  This is a chronic problem. The problem is controlled. Exacerbating diseases include obesity. She has no history of diabetes. Pertinent negatives include no chest pain, leg pain, myalgias or shortness of breath. Current antihyperlipidemic treatment includes statins.  Hypertension  This is a chronic problem. The problem is unchanged. The problem is controlled. Pertinent negatives include no blurred vision, chest pain, headaches, palpitations or shortness of breath. Past treatments include diuretics. Hypertensive end-organ damage includes kidney disease (being followed by Nephrology.). There is no history of CAD/MI or CVA.      Past Medical History:  Diagnosis Date  . Hyperlipidemia   . Hypertension   . Osteoarthritis     Past Surgical History:  Procedure Laterality Date  . KNEE SURGERY  2010    Family History  Problem Relation Age of Onset  . Cancer Mother        lung  . Cancer Father        brain  . Cancer Brother   . Cancer Brother        esophagus    Social History   Social History  . Marital status: Divorced    Spouse name: N/A  . Number of children: N/A  . Years of education: N/A   Occupational History  . Not on file.   Social History Main Topics  . Smoking status: Former Smoker    Types: Cigarettes  . Smokeless tobacco: Never Used     Comment: quit in 2002  . Alcohol use No  . Drug use: No  . Sexual activity: No   Other Topics Concern  . Not on file   Social History Narrative  . No narrative on file     Current Outpatient Prescriptions:  .  atorvastatin (LIPITOR) 40 MG tablet, Take 1 tablet (40 mg total) by mouth daily., Disp: 90 tablet, Rfl: 0 .  Cholecalciferol (VITAMIN D-3 PO), Take by mouth., Disp: ,  Rfl:  .  hydrochlorothiazide (HYDRODIURIL) 25 MG tablet, Take 1 tablet (25 mg total) by mouth daily., Disp: 90 tablet, Rfl: 0 .  Omega-3 Fatty Acids (FISH OIL) 1000 MG CAPS, Take 1,000 mg by mouth., Disp: , Rfl:  .  Potassium Gluconate 595 MG CAPS, Take by mouth., Disp: , Rfl:   No Known Allergies   Review of Systems  Eyes: Negative for blurred vision.  Respiratory: Negative for shortness of breath.   Cardiovascular: Negative for chest pain and palpitations.  Musculoskeletal: Negative for myalgias.  Neurological: Negative for headaches.      Objective  Vitals:   12/15/16 0920  BP: 118/74  Pulse: (!) 110  Resp: 17  Temp: 98.5 F (36.9 C)  TempSrc: Oral  SpO2: 95%  Weight: 223 lb (101.2 kg)  Height: 5\' 4"  (1.626 m)    Physical Exam  Constitutional: She is oriented to person, place, and time and well-developed, well-nourished, and in no distress.  Cardiovascular: Normal rate, regular rhythm, S1 normal, S2 normal and normal heart sounds.   No murmur heard. Pulmonary/Chest: Effort normal and breath sounds normal. She has no wheezes.  Abdominal: Soft. Bowel sounds are normal. There is no tenderness.  Musculoskeletal: Normal range of motion. She exhibits no edema.  Right ankle: She exhibits no swelling.       Left ankle: She exhibits no swelling.  Neurological: She is alert and oriented to person, place, and time.  Psychiatric: Mood, memory, affect and judgment normal.  Nursing note and vitals reviewed.    Assessment & Plan  1. Essential hypertension BP stable on present antihypertensive treatment - hydrochlorothiazide (HYDRODIURIL) 25 MG tablet; Take 1 tablet (25 mg total) by mouth daily.  Dispense: 90 tablet; Refill: 0  2. Hyperlipidemia, unspecified hyperlipidemia type  - atorvastatin (LIPITOR) 40 MG tablet; Take 1 tablet (40 mg total) by mouth daily.  Dispense: 90 tablet; Refill: 0 - COMPLETE METABOLIC PANEL WITH GFR - Lipid panel  3. Type 2 diabetes  mellitus without complication, without long-term current use of insulin (HCC)  A1c 6.6%, consistent with diabetes, patient will be asked to schedule an appointment to discuss pharmacotherapy  - POCT glycosylated hemoglobin (Hb A1C)    Zacaria Pousson Asad A[facility] 12/15/2016 9:27 AM

## 2016-12-16 LAB — COMPLETE METABOLIC PANEL WITH GFR
AG Ratio: 1.3 (calc) (ref 1.0–2.5)
ALT: 11 U/L (ref 6–29)
AST: 13 U/L (ref 10–35)
Albumin: 4 g/dL (ref 3.6–5.1)
Alkaline phosphatase (APISO): 83 U/L (ref 33–130)
BUN/Creatinine Ratio: 13 (calc) (ref 6–22)
BUN: 15 mg/dL (ref 7–25)
CO2: 34 mmol/L — ABNORMAL HIGH (ref 20–32)
Calcium: 9.6 mg/dL (ref 8.6–10.4)
Chloride: 98 mmol/L (ref 98–110)
Creat: 1.14 mg/dL — ABNORMAL HIGH (ref 0.60–0.93)
GFR, Est African American: 54 mL/min/{1.73_m2} — ABNORMAL LOW (ref >=60)
GFR, Est Non African American: 46 mL/min/{1.73_m2} — ABNORMAL LOW (ref >=60)
Globulin: 3.2 g/dL (calc) (ref 1.9–3.7)
Glucose, Bld: 108 mg/dL — ABNORMAL HIGH (ref 65–99)
Potassium: 3.4 mmol/L — ABNORMAL LOW (ref 3.5–5.3)
Sodium: 142 mmol/L (ref 135–146)
Total Bilirubin: 1 mg/dL (ref 0.2–1.2)
Total Protein: 7.2 g/dL (ref 6.1–8.1)

## 2016-12-16 LAB — LIPID PANEL
Cholesterol: 161 mg/dL (ref <200)
HDL: 45 mg/dL — ABNORMAL LOW (ref >50)
LDL Cholesterol (Calc): 97 mg/dL (calc)
Non-HDL Cholesterol (Calc): 116 mg/dL (calc) (ref <130)
Total CHOL/HDL Ratio: 3.6 (calc) (ref <5.0)
Triglycerides: 98 mg/dL (ref <150)

## 2016-12-17 ENCOUNTER — Telehealth: Payer: Self-pay

## 2016-12-17 MED ORDER — POTASSIUM CHLORIDE ER 10 MEQ PO TBCR: 10.0000 meq | EXTENDED_RELEASE_TABLET | Freq: Every day | ORAL | 0 refills | Status: DC

## 2016-12-17 NOTE — Telephone Encounter (Signed)
Patient has been notified of lab results and a prescription for potasium chloride 10 mEq 1 tablet daily x3 days has been sent to Tammy Lozano per Dr. Manuella Ghazi, patient has been notified

## 2017-02-06 ENCOUNTER — Telehealth: Payer: Self-pay

## 2017-02-06 NOTE — Telephone Encounter (Signed)
Called pt re: need to reschedule appt on 03/16/17 for AWV with [facility]. No answer nor does pt have VM established to leave message.

## 2017-02-10 ENCOUNTER — Telehealth: Payer: Self-pay

## 2017-02-10 NOTE — Telephone Encounter (Signed)
Called pt re: need to reschedule appt on 03/16/17 for Tammy Lozano with [facility]. No answer nor does pt have VM established to leave message.

## 2017-02-13 NOTE — Progress Notes (Signed)
2 attempts have been made to reach pt re: need to reschedule appt. No returned calls have been received from the patient. Letter mailed today.

## 2017-03-16 ENCOUNTER — Ambulatory Visit: Payer: Medicare Other

## 2017-03-17 ENCOUNTER — Ambulatory Visit: Payer: Medicare Other

## 2017-03-20 ENCOUNTER — Ambulatory Visit (INDEPENDENT_AMBULATORY_CARE_PROVIDER_SITE_OTHER): Payer: Medicare Other

## 2017-03-20 VITALS — BP 110/82 | HR 90 | Temp 98.8°F | Resp 16 | Ht 64.0 in | Wt 218.5 lb

## 2017-03-20 DIAGNOSIS — Z0000 Encounter for general adult medical examination without abnormal findings: Secondary | ICD-10-CM

## 2017-03-20 DIAGNOSIS — Z1239 Encounter for other screening for malignant neoplasm of breast: Secondary | ICD-10-CM

## 2017-03-20 DIAGNOSIS — Z1211 Encounter for screening for malignant neoplasm of colon: Secondary | ICD-10-CM

## 2017-03-20 DIAGNOSIS — Z1231 Encounter for screening mammogram for malignant neoplasm of breast: Secondary | ICD-10-CM | POA: Diagnosis not present

## 2017-03-20 NOTE — Progress Notes (Signed)
Subjective:   Tammy Lozano is a 76 y.o. female who presents for Medicare Annual (Subsequent) preventive examination.  Review of Systems:  N/A Cardiac Risk Factors include: advanced age (>14men, >65 women);obesity (BMI >30kg/m2);sedentary lifestyle;dyslipidemia;hypertension     Objective:     Vitals: BP 110/82 (BP Location: Left Arm, Patient Position: Sitting, Cuff Size: Large)   Pulse 90   Temp 98.8 F (37.1 C) (Oral)   Resp 16   Ht 5\' 4"  (1.626 m)   Wt 218 lb 8 oz (99.1 kg)   BMI 37.51 kg/m   Body mass index is 37.51 kg/m.  Advanced Directives 03/20/2017 12/15/2016 06/13/2016 03/17/2016 03/17/2016 02/21/2016 11/28/2015  Does Patient Have a Medical Advance Directive? Yes No No No No No No  Type of Paramedic of [place];Living will - - - - - -  Copy of [place] in Chart? No - copy requested - - - - - -  Would patient like information on creating a medical advance directive? - - - No - Patient declined - - No - patient declined information   Pt has been provided with the "MOST" and "DNR" documents for her review. Pt has been advised that she will only need to complete the document that is most appropriate to suit his/her health care wishes. Once completed, pt has been advised to return the appropriate document to the office for the physician to review and sign. Verbalized acceptance and understanding.  Tobacco Social History   Tobacco Use  Smoking Status Former Smoker  . Packs/day: 2.50  . Years: 40.00  . Pack years: 100.00  . Types: Cigarettes  . Last attempt to quit: 2002  . Years since quitting: 16.9  Smokeless Tobacco Never Used  Tobacco Comment   quit in 2002. Has not resumed smoking since 2002. Pt does not require smoking cessation materials.     Counseling given: No Comment: quit in 2002. Has not resumed smoking since 2002. Pt does not require smoking cessation materials.   Clinical Intake:  Pre-visit preparation  completed: Yes  Pain : No/denies pain     BMI - recorded: 37.51 Nutritional Status: BMI > 30  Obese Nutritional Risks: None Diabetes: Yes(Prediabetic) CBG done?: No Did pt. bring in CBG monitor from home?: No  How often do you need to have someone help you when you read instructions, pamphlets, or other written materials from your doctor or pharmacy?: 1 - Never What is the last grade level you completed in school?: 12th grade  Interpreter Needed?: No  Information entered by :: AEversole, LPN  Past Medical History:  Diagnosis Date  . Hyperlipidemia   . Hypertension   . Osteoarthritis    Past Surgical History:  Procedure Laterality Date  . KNEE SURGERY  2010   Family History  Problem Relation Age of Onset  . Cancer Mother        lung  . Cancer Father        brain  . Aneurysm Sister   . Cancer Brother   . Cancer Brother        esophagus   Social History   Socioeconomic History  . Marital status: Divorced    Spouse name: None  . Number of children: 3  . Years of education: 105  . Highest education level: 12th grade  Social Needs  . Financial resource strain: Not hard at all  . Food insecurity - worry: Never true  . Food insecurity - inability: Never true  .  Transportation needs - medical: No  . Transportation needs - non-medical: No  Occupational History    Employer: RETIRED  Tobacco Use  . Smoking status: Former Smoker    Packs/day: 2.50    Years: 40.00    Pack years: 100.00    Types: Cigarettes    Last attempt to quit: 2002    Years since quitting: 16.9  . Smokeless tobacco: Never Used  . Tobacco comment: quit in 2002. Has not resumed smoking since 2002. Pt does not require smoking cessation materials.  Substance and Sexual Activity  . Alcohol use: No    Alcohol/week: 0.0 oz  . Drug use: No  . Sexual activity: No  Other Topics Concern  . None  Social History Narrative  . None    Outpatient Encounter Medications as of 03/20/2017  Medication  Sig  . atorvastatin (LIPITOR) 40 MG tablet Take 1 tablet (40 mg total) by mouth daily.  . Cholecalciferol (VITAMIN D-3 PO) Take by mouth daily.   . hydrochlorothiazide (HYDRODIURIL) 25 MG tablet Take 1 tablet (25 mg total) by mouth daily.  . Omega-3 Fatty Acids (FISH OIL) 1000 MG CAPS Take 1,000 mg by mouth daily.   . potassium chloride (K-DUR) 10 MEQ tablet Take 1 tablet (10 mEq total) by mouth daily.  . Potassium Gluconate 595 MG CAPS Take by mouth daily.    No facility-administered encounter medications on file as of 03/20/2017.     Activities of Daily Living In your present state of health, do you have any difficulty performing the following activities: 03/20/2017 12/15/2016  Hearing? N N  Comment Denies wearing hearing aids -  Vision? N Y  Comment Wears eyeglasses glasses  Difficulty concentrating or making decisions? N N  Walking or climbing stairs? Y Y  Comment hip pain -  Dressing or bathing? N N  Doing errands, shopping? N N  Preparing Food and eating ? N -  Comment full set of upper and lower dentures -  Using the Toilet? N -  In the past six months, have you accidently leaked urine? N -  Do you have problems with loss of bowel control? N -  Managing your Medications? N -  Managing your Finances? N -  Housekeeping or managing your Housekeeping? N -  Some recent data might be hidden    Patient Care Team: Roselee Nova, MD as PCP - General (Family Medicine)    Assessment:   This is a routine wellness examination for Tammy Lozano.  Exercise Activities and Dietary recommendations Current Exercise Habits: The patient does not participate in regular exercise at present, Exercise limited by: orthopedic condition(s)(hip pain)  Goals    Continue to eliminate large meals and snacks prior to bedtime      Fall Risk Fall Risk  03/20/2017 12/15/2016 06/13/2016 03/17/2016 03/17/2016  Falls in the past year? No No No No No  Risk for fall due to : Other (Comment);Medication side  effect - - - -  Risk for fall due to: Comment use of walker, takes diuretic - - - -   Is the patient's home free of loose throw rugs in walkways, pet beds, electrical cords, etc?   yes      Grab bars in the bathroom? No. Declined offer to submit referral to Care Guide for installation.      Handrails on the stairs?   yes      Adequate lighting?   yes  States she uses a shower chair to shower and  has an elevated toilet seat to reduce risk for fall. Uses either a cane or walker for all ambulation.  Timed Get Up and Go performed: Yes. 20 sec with use of walker. Gait slow and steady. No intervention required at this time.  Depression Screen PHQ 2/9 Scores 03/20/2017 12/15/2016 06/13/2016 03/17/2016  PHQ - 2 Score 0 0 0 0     Cognitive Function     6CIT Screen 03/20/2017 03/17/2016  What Year? 0 points 0 points  What month? 0 points 0 points  What time? 0 points 0 points  Count back from 20 0 points 0 points  Months in reverse 0 points 0 points  Repeat phrase 2 points 0 points  Total Score 2 0    Immunization History  Administered Date(s) Administered  . Pneumococcal Polysaccharide-23 09/04/2011   Due for PCV13 and Flu Vaccine. Declined my offer to administer today. Education has been provided regarding the importance of these vaccines but still declined. Pt has been advised to call our office should she change her mind and want to receive these vaccines. Pt has also been advised to call her insurance company to determine her out of pocket expense. Advised she may also receive these vaccines at her local pharmacy or Health Dept. Verbalized acceptance and understanding.  Qualifies for Shingles Vaccine? Due for Shingles vaccine. Declined my offer to administer today. Education has been provided regarding the importance of this vaccine but still declined. Pt has been advised to call her insurance company to determine her out of pocket expense. Advised she may also receive this vaccine at her  local pharmacy or Health Dept. Verbalized acceptance and understanding.  Screening Tests Health Maintenance  Topic Date Due  . FOOT EXAM  [birth year]09/1950  . OPHTHALMOLOGY EXAM  [birth year]09/1950  . URINE MICROALBUMIN  05/09/1949  . MAMMOGRAM  05/09/1957  . DEXA SCAN  05/09/2004  . PNA vac Low Risk Adult (2 of 2 - PCV13) 09/03/2012  . INFLUENZA VACCINE  11/05/2016  . COLONOSCOPY  12/22/2016  . HEMOGLOBIN A1C  06/14/2017  . TETANUS/TDAP  01/25/2024   Due for diabetic foot exam. Advised to schedule appointment with Dr. Manuella Ghazi within the next 30 days.  Due for diabetic eye exam. Pt states she was seen approximately Decemebr of 2017 at [facility] has been advised about the importance of this exam. Advised pt to contact their office to request copies of her records be forwarded to our office for review and scanning purposes. Verbalized acceptance and understanding.  Due for Urine Microalbumin. Advised to schedule appointment with Dr. Manuella Ghazi within the next 30 days.  Cancer Screenings: Lung: Low Dose CT Chest recommended if Age 43-80 years, 30 pack-year currently smoking OR have quit w/in 15years. Patient does qualify. A message has been sent to Burgess Estelle, RN (Oncology Nurse Navigator) regarding need for this exam. Raquel Sarna will review patient's chart and previous imaging, discuss with radiology. If pt qualifies, Raquel Sarna will order Low Dose CT chest and call patient to schedule exam. Breast:  Up to date on Mammogram? No  Completed 11/17/12. Ordered today. Pt has been given contact information to schedule her appointment with [facility]. Up to date of Bone Density/Dexa? No  Completed 08/06/07. Repeat osteoporotic screening ordered by Dr. Manuella Ghazi on 06/13/16. Pt has been given contact information to schedule her appointment with [facility]. Colorectal: Completed 12/23/11. Repeat colonoscopy every 5 years. Referral to GI completed today. Message sent to referral coordinator to schedule appointment with GI.  Additional  Screenings: Hepatitis  B/HIV/Syphillis: does not qualify Hepatitis C Screening: does not qualify     Plan:    In preparation for this patient's Medicare Annual Wellness visit, I have personally reviewed the following information in the patient's chart:  A. Medical and Surgical History B. Office visits, Hospitalizations and ER visits within the last 12 months C. Radiology, Laboratory and Pathological Reports (including those records in [place]) D. Health Maintenance for accuracy and any attached, scanned or relevant reports or letters E.  Immunization History F.  Upcoming referrals and appointments G. Advance directives and Code Status  I have personally addressed the Medicare Wellness Questionnaire with the patient and have noted and/or up dated the following information:  A.  Current medications (including OTC medications) B. Medication Allergies C. Medical and Surgical History D.  Physicians on the patient's care team [place] Physical activity G. Functional ability and status H. Nutritional status I. Risk for fall and preventative measures (including TUG test) J. Use of alcohol, tobacco or illicit drugs  K.          Screenings such as hearing, vision, cognitive and depression L. Advance Directives and Code Status M. Realistic Patient Goals N. Financial and Social strains if any  In addition, I have reviewed and discussed with the patient, certain preventive protocols, quality metrics, and best practice recommendations. To ensure quality care and proper  continuity of care, any concerns that arose during the Medicare Wellness visit were addressed with the patient's physician. A written personalized care plan for preventive services as well as general preventive health recommendations were provided to patient.  Signed,  Aleatha Borer, LPN

## 2017-03-20 NOTE — Patient Instructions (Signed)
Tammy Lozano , Thank you for taking time to come for your Medicare Wellness Visit. I appreciate your ongoing commitment to your health goals. Please review the following plan we discussed and let me know if I can assist you in the future.   Screening recommendations/referrals: Colonoscopy: Completed 12/23/11 Repeat colonoscopy every 5 years. Our office will call you about your appointment date, time and location. Mammogram: Completed 11/17/12. Repeat mammogram every year. Ordered today. Please call [phone] to schedule your mammogram.  Lung Cancer Screening: Last Chest X-Ray 07/27/14. You have not yet had a CT scan of your chest. A message will be sent to [facility]. He will call you to make arrangements for scheduling this exam. Hepatitis C Screening: Not required HIV/Syphilis/Hepatitis B Screening: Not required Bone Density: Completed 08/06/07. Ordered 06/13/16. Please call [phone] to schedule your Bone Density exam.  Recommended yearly ophthalmology/optometry visit for glaucoma screening and checkup. You states you were seen approximately 03/2016 at [facility] for annual eye exams. Please forward your records to our office for review and scanning purposes. Diabetic foot exam: Please schedule an appointment with Dr. Manuella Ghazi within the next 30 days. Recommended yearly dental visit for hygiene and checkup  Vaccinations: Influenza vaccine: Last completed 01/2014. Declined to receive vaccine today. Pneumococcal vaccine: Completed PPSV23 01/24/14. Declined to receive PCV13 today. Tdap vaccine: Completed 01/24/14 Shingles vaccine: Declined. Please call your insurance company to determine your out of pocket expense. You may also receive this vaccine at your local pharmacy or Health Dept.  Advanced directives: Please bring a copy of your POA (Power of Attorney) and/or Living Will to your next appointment.   Conditions/risks identified: Continue to eliminate large meals and snacks prior to  bedtime  Next appointment: Please schedule an appointment with Dr. Manuella Ghazi within the next 30 days for follow up after today's Annual Wellness Visit.  Please schedule your Annual Wellness Visit with your Nurse Health Advisor in one year.  Preventive Care 30 Years and Older, Female Preventive care refers to lifestyle choices and visits with your health care provider that can promote health and wellness. What does preventive care include?  A yearly physical exam. This is also called an annual well check.  Dental exams once or twice a year.  Routine eye exams. Ask your health care provider how often you should have your eyes checked.  Personal lifestyle choices, including:  Daily care of your teeth and gums.  Regular physical activity.  Eating a healthy diet.  Avoiding tobacco and drug use.  Limiting alcohol use.  Practicing safe sex.  Taking low-dose aspirin every day.  Taking vitamin and mineral supplements as recommended by your health care provider. What happens during an annual well check? The services and screenings done by your health care provider during your annual well check will depend on your age, overall health, lifestyle risk factors, and family history of disease. Counseling  Your health care provider may ask you questions about your:  Alcohol use.  Tobacco use.  Drug use.  Emotional well-being.  Home and relationship well-being.  Sexual activity.  Eating habits.  History of falls.  Memory and ability to understand (cognition).  Work and work Statistician.  Reproductive health. Screening  You may have the following tests or measurements:  Height, weight, and BMI.  Blood pressure.  Lipid and cholesterol levels. These may be checked every 5 years, or more frequently if you are over 65 years old.  Skin check.  Lung cancer screening. You may have this screening  every year starting at age 10 if you have a 30-pack-year history of smoking and  currently smoke or have quit within the past 15 years.  Fecal occult blood test (FOBT) of the stool. You may have this test every year starting at age 22.  Flexible sigmoidoscopy or colonoscopy. You may have a sigmoidoscopy every 5 years or a colonoscopy every 10 years starting at age 68.  Hepatitis C blood test.  Hepatitis B blood test.  Sexually transmitted disease (STD) testing.  Diabetes screening. This is done by checking your blood sugar (glucose) after you have not eaten for a while (fasting). You may have this done every 1-3 years.  Bone density scan. This is done to screen for osteoporosis. You may have this done starting at age 1.  Mammogram. This may be done every 1-2 years. Talk to your health care provider about how often you should have regular mammograms. Talk with your health care provider about your test results, treatment options, and if necessary, the need for more tests. Vaccines  Your health care provider may recommend certain vaccines, such as:  Influenza vaccine. This is recommended every year.  Tetanus, diphtheria, and acellular pertussis (Tdap, Td) vaccine. You may need a Td booster every 10 years.  Zoster vaccine. You may need this after age 58.  Pneumococcal 13-valent conjugate (PCV13) vaccine. One dose is recommended after age 41.  Pneumococcal polysaccharide (PPSV23) vaccine. One dose is recommended after age 87. Talk to your health care provider about which screenings and vaccines you need and how often you need them. This information is not intended to replace advice given to you by your health care provider. Make sure you discuss any questions you have with your health care provider. Document Released: 04/20/2015 Document Revised: 12/12/2015 Document Reviewed: 01/23/2015 Elsevier Interactive Patient Education  2017 [place] Prevention in the Home Falls can cause injuries. They can happen to people of all ages. There are many things you  can do to make your home safe and to help prevent falls. What can I do on the outside of my home?  Regularly fix the edges of walkways and driveways and fix any cracks.  Remove anything that might make you trip as you walk through a door, such as a raised step or threshold.  Trim any bushes or trees on the path to your home.  Use bright outdoor lighting.  Clear any walking paths of anything that might make someone trip, such as rocks or tools.  Regularly check to see if handrails are loose or broken. Make sure that both sides of any steps have handrails.  Any raised decks and porches should have guardrails on the edges.  Have any leaves, snow, or ice cleared regularly.  Use sand or salt on walking paths during winter.  Clean up any spills in your garage right away. This includes oil or grease spills. What can I do in the bathroom?  Use night lights.  Install grab bars by the toilet and in the tub and shower. Do not use towel bars as grab bars.  Use non-skid mats or decals in the tub or shower.  If you need to sit down in the shower, use a plastic, non-slip stool.  Keep the floor dry. Clean up any water that spills on the floor as soon as it happens.  Remove soap buildup in the tub or shower regularly.  Attach bath mats securely with double-sided non-slip rug tape.  Do not have throw rugs  and other things on the floor that can make you trip. What can I do in the bedroom?  Use night lights.  Make sure that you have a light by your bed that is easy to reach.  Do not use any sheets or blankets that are too big for your bed. They should not hang down onto the floor.  Have a firm chair that has side arms. You can use this for support while you get dressed.  Do not have throw rugs and other things on the floor that can make you trip. What can I do in the kitchen?  Clean up any spills right away.  Avoid walking on wet floors.  Keep items that you use a lot in  easy-to-reach places.  If you need to reach something above you, use a strong step stool that has a grab bar.  Keep electrical cords out of the way.  Do not use floor polish or wax that makes floors slippery. If you must use wax, use non-skid floor wax.  Do not have throw rugs and other things on the floor that can make you trip. What can I do with my stairs?  Do not leave any items on the stairs.  Make sure that there are handrails on both sides of the stairs and use them. Fix handrails that are broken or loose. Make sure that handrails are as long as the stairways.  Check any carpeting to make sure that it is firmly attached to the stairs. Fix any carpet that is loose or worn.  Avoid having throw rugs at the top or bottom of the stairs. If you do have throw rugs, attach them to the floor with carpet tape.  Make sure that you have a light switch at the top of the stairs and the bottom of the stairs. If you do not have them, ask someone to add them for you. What else can I do to help prevent falls?  Wear shoes that:  Do not have high heels.  Have rubber bottoms.  Are comfortable and fit you well.  Are closed at the toe. Do not wear sandals.  If you use a stepladder:  Make sure that it is fully opened. Do not climb a closed stepladder.  Make sure that both sides of the stepladder are locked into place.  Ask someone to hold it for you, if possible.  Clearly mark and make sure that you can see:  Any grab bars or handrails.  First and last steps.  Where the edge of each step is.  Use tools that help you move around (mobility aids) if they are needed. These include:  Canes.  Walkers.  Scooters.  Crutches.  Turn on the lights when you go into a dark area. Replace any light bulbs as soon as they burn out.  Set up your furniture so you have a clear path. Avoid moving your furniture around.  If any of your floors are uneven, fix them.  If there are any pets  around you, be aware of where they are.  Review your medicines with your doctor. Some medicines can make you feel dizzy. This can increase your chance of falling. Ask your doctor what other things that you can do to help prevent falls. This information is not intended to replace advice given to you by your health care provider. Make sure you discuss any questions you have with your health care provider. Document Released: 01/18/2009 Document Revised: 08/30/2015 Document Reviewed: 04/28/2014 Elsevier  Interactive Patient Education  2017 [employer].

## 2017-03-23 ENCOUNTER — Telehealth: Payer: Self-pay | Admitting: *Deleted

## 2017-03-23 NOTE — Telephone Encounter (Signed)
Received referral for initial lung cancer screening scan. Contacted patient and obtained smoking history, which includes a quit date of 2002. Due to time since smoking cessation being > 15 years, patient is not eligible for lung screening. Patient is cautioned to notify their PCP for any signs of lung cancer even though she is not considered high risk by lung screening standards. Patient verbalizes understanding.

## 2017-04-13 ENCOUNTER — Other Ambulatory Visit: Payer: Self-pay | Admitting: Family Medicine

## 2017-04-13 DIAGNOSIS — E785 Hyperlipidemia, unspecified: Secondary | ICD-10-CM

## 2017-04-13 NOTE — Telephone Encounter (Signed)
CRM for notification. See Telephone encounter for:   04/13/17.   Relation to UY:QIHK Call back number: [phone] Pharmacy: [place], [place] - [address] [phone] (Phone) [phone] (Fax)    Reason for call:  Patient requesting atorvastatin (LIPITOR) 40 MG tablet refill to hold her over until 04/20/17 appointment, pleas advise

## 2017-04-14 MED ORDER — ATORVASTATIN CALCIUM 40 MG PO TABS: 40.0000 mg | ORAL_TABLET | Freq: Every day | ORAL | 0 refills | Status: DC

## 2017-04-20 ENCOUNTER — Ambulatory Visit (INDEPENDENT_AMBULATORY_CARE_PROVIDER_SITE_OTHER): Payer: Medicare Other | Admitting: Family Medicine

## 2017-04-20 ENCOUNTER — Encounter: Payer: Self-pay | Admitting: Family Medicine

## 2017-04-20 VITALS — BP 110/68 | HR 93 | Temp 98.2°F | Resp 16 | Wt 222.7 lb

## 2017-04-20 DIAGNOSIS — I10 Essential (primary) hypertension: Secondary | ICD-10-CM | POA: Diagnosis not present

## 2017-04-20 DIAGNOSIS — E119 Type 2 diabetes mellitus without complications: Secondary | ICD-10-CM

## 2017-04-20 DIAGNOSIS — E7800 Pure hypercholesterolemia, unspecified: Secondary | ICD-10-CM | POA: Diagnosis not present

## 2017-04-20 LAB — GLUCOSE, POCT (MANUAL RESULT ENTRY): POC Glucose: 100 mg/dl — AB (ref 70–99)

## 2017-04-20 LAB — POCT GLYCOSYLATED HEMOGLOBIN (HGB A1C): Hemoglobin A1C: 6.3

## 2017-04-20 MED ORDER — HYDROCHLOROTHIAZIDE 25 MG PO TABS: 25.0000 mg | ORAL_TABLET | Freq: Every day | ORAL | 0 refills | Status: DC

## 2017-04-20 NOTE — Progress Notes (Signed)
Name: Tammy Lozano   MRN: [id number]    DOB: 1940/06/06   Date:04/20/2017       Progress Note  Subjective  Chief Complaint  Chief Complaint  Patient presents with  . Hypertension    Pt denies any issue   . Hyperlipidemia    Pt states she try to eat healthy   . Medication Refill    BP medication    Hypertension  This is a chronic problem. The problem is unchanged. The problem is controlled. Pertinent negatives include no blurred vision, chest pain, headaches, palpitations or shortness of breath. Past treatments include diuretics. Hypertensive end-organ damage includes kidney disease (being followed by Nephrology.). There is no history of CAD/MI or CVA.  Hyperlipidemia  This is a chronic problem. The problem is controlled. Exacerbating diseases include diabetes and obesity. Pertinent negatives include no chest pain, leg pain or shortness of breath. Current antihyperlipidemic treatment includes statins. Risk factors for coronary artery disease include diabetes mellitus, dyslipidemia and obesity.  Diabetes  She presents for her follow-up diabetic visit. She has type 2 diabetes mellitus. Her disease course has been improving. There are no hypoglycemic associated symptoms. Pertinent negatives for hypoglycemia include no dizziness, headaches or speech difficulty. Pertinent negatives for diabetes include no blurred vision, no chest pain, no fatigue, no foot paresthesias, no polydipsia and no polyuria. Pertinent negatives for diabetic complications include no CVA, heart disease or peripheral neuropathy. Current diabetic treatment includes diet. Her weight is stable. She is following a generally healthy diet. She rarely participates in exercise. Frequency home blood tests: does not check her blood glucose.     Past Medical History:  Diagnosis Date  . Hyperlipidemia   . Hypertension   . Osteoarthritis     Past Surgical History:  Procedure Laterality Date  . KNEE SURGERY  2010    Family  History  Problem Relation Age of Onset  . Cancer Mother        lung  . Cancer Father        brain  . Aneurysm Sister   . Cancer Brother   . Cancer Brother        esophagus    Social History   Socioeconomic History  . Marital status: Divorced    Spouse name: Not on file  . Number of children: 3  . Years of education: 25  . Highest education level: 12th grade  Social Needs  . Financial resource strain: Not hard at all  . Food insecurity - worry: Never true  . Food insecurity - inability: Never true  . Transportation needs - medical: No  . Transportation needs - non-medical: No  Occupational History    Employer: RETIRED  Tobacco Use  . Smoking status: Former Smoker    Packs/day: 2.50    Years: 40.00    Pack years: 100.00    Types: Cigarettes    Last attempt to quit: 2002    Years since quitting: 17.0  . Smokeless tobacco: Never Used  . Tobacco comment: quit in 2002. Has not resumed smoking since 2002. Pt does not require smoking cessation materials.  Substance and Sexual Activity  . Alcohol use: No    Alcohol/week: 0.0 oz  . Drug use: No  . Sexual activity: No  Other Topics Concern  . Not on file  Social History Narrative  . Not on file     Current Outpatient Medications:  .  atorvastatin (LIPITOR) 40 MG tablet, Take 1 tablet (40 mg total) by mouth  daily., Disp: 90 tablet, Rfl: 0 .  Cholecalciferol (VITAMIN D-3 PO), Take 1,000 Units by mouth daily. , Disp: , Rfl:  .  hydrochlorothiazide (HYDRODIURIL) 25 MG tablet, Take 1 tablet (25 mg total) by mouth daily., Disp: 90 tablet, Rfl: 0 .  Omega-3 Fatty Acids (FISH OIL) 1000 MG CAPS, Take 1,000 mg by mouth daily. , Disp: , Rfl:  .  potassium chloride (K-DUR) 10 MEQ tablet, Take 1 tablet (10 mEq total) by mouth daily., Disp: 3 tablet, Rfl: 0 .  Potassium Gluconate 595 MG CAPS, Take 595 capsules by mouth daily. , Disp: , Rfl:   No Known Allergies   Review of Systems  Constitutional: Negative for fatigue.  Eyes:  Negative for blurred vision.  Respiratory: Negative for shortness of breath.   Cardiovascular: Negative for chest pain and palpitations.  Neurological: Negative for dizziness, speech difficulty and headaches.  Endo/Heme/Allergies: Negative for polydipsia.     Objective  Vitals:   04/20/17 1003  BP: 110/68  Pulse: 93  Resp: 16  Temp: 98.2 F (36.8 C)  TempSrc: Oral  SpO2: 93%  Weight: 222 lb 11.2 oz (101 kg)    Physical Exam  Constitutional: She is oriented to person, place, and time and well-developed, well-nourished, and in no distress.  HENT:  Head: Normocephalic and atraumatic.  Cardiovascular: Normal rate, regular rhythm, S1 normal, S2 normal and normal heart sounds.  No murmur heard. Pulmonary/Chest: Effort normal and breath sounds normal. She has no wheezes.  Abdominal: Soft. Bowel sounds are normal. There is no tenderness.  Musculoskeletal: Normal range of motion. She exhibits no edema.       Right ankle: She exhibits no swelling.       Left ankle: She exhibits no swelling.  Neurological: She is alert and oriented to person, place, and time.  Psychiatric: Mood, memory, affect and judgment normal.  Nursing note and vitals reviewed.    Recent Results (from the past 2160 hour(s))  POCT Glucose (CBG)     Status: Abnormal   Collection Time: 04/20/17 10:22 AM  Result Value Ref Range   POC Glucose 100 (A) 70 - 99 mg/dl     Assessment & Plan  1. Controlled type 2 diabetes mellitus without complication, without long-term current use of insulin (HCC)  A1c 6.3%, consistent with well-controlled diabetes, no indication for pharmacotherapy - POCT HgB A1C - POCT Glucose (CBG)  2. Essential hypertension BP stable on present antihypertensive treatment - hydrochlorothiazide (HYDRODIURIL) 25 MG tablet; Take 1 tablet (25 mg total) by mouth daily.  Dispense: 90 tablet; Refill: 0  3. Pure hypercholesterolemia FLP at goal set for below normal HDL, continue on statin and  repeat in 3 months   Leisel Pinette [facility] 04/20/2017 10:22 AM

## 2017-05-01 DIAGNOSIS — H2513 Age-related nuclear cataract, bilateral: Secondary | ICD-10-CM | POA: Diagnosis not present

## 2017-05-01 LAB — HM DIABETES EYE EXAM

## 2017-06-01 DIAGNOSIS — M85852 Other specified disorders of bone density and structure, left thigh: Secondary | ICD-10-CM | POA: Insufficient documentation

## 2017-07-13 ENCOUNTER — Telehealth: Payer: Self-pay

## 2017-07-13 DIAGNOSIS — E785 Hyperlipidemia, unspecified: Secondary | ICD-10-CM

## 2017-07-13 DIAGNOSIS — I10 Essential (primary) hypertension: Secondary | ICD-10-CM

## 2017-07-13 MED ORDER — ATORVASTATIN CALCIUM 40 MG PO TABS: 40.0000 mg | ORAL_TABLET | Freq: Every day | ORAL | 0 refills | Status: DC

## 2017-07-13 MED ORDER — HYDROCHLOROTHIAZIDE 25 MG PO TABS: 25.0000 mg | ORAL_TABLET | Freq: Every day | ORAL | 0 refills | Status: DC

## 2017-07-13 NOTE — Telephone Encounter (Signed)
Her potassium was slightly low, she needs to increase potassium rich items to her meals ( spinach is a good option). Please send her a list.  I will only send 30 days of hctz to see if we need to adjust medication on her next visit, to avoid further drop of the potassium level

## 2017-07-13 NOTE — Telephone Encounter (Addendum)
Hypertension medication request: HCTZ to [facility]  Last office visit pertaining to hypertension: 04/20/2017   BP Readings from Last 3 Encounters:  04/20/17 110/68  03/20/17 110/82  12/15/16 118/74    Lab Results  Component Value Date   CREATININE 1.14 (H) 12/15/2016   BUN 15 12/15/2016   NA 142 12/15/2016   K 3.4 (L) 12/15/2016   CL 98 12/15/2016   CO2 34 (H) 12/15/2016    Refill Request for Cholesterol medication. Atorvastatin.   Lab Results  Component Value Date   CHOL 161 12/15/2016   HDL 45 (L) 12/15/2016   LDLCALC 97 12/15/2016   TRIG 98 12/15/2016   CHOLHDL 3.6 12/15/2016    Follow up on 07/29/2017

## 2017-07-15 NOTE — Telephone Encounter (Signed)
Patient notified and will start increasing her Potassium in her diet. States she just picked up the HCTZ medication and is taking a Potassium capsule as well.

## 2017-07-20 DIAGNOSIS — N183 Chronic kidney disease, stage 3 (moderate): Secondary | ICD-10-CM | POA: Diagnosis not present

## 2017-07-20 DIAGNOSIS — I10 Essential (primary) hypertension: Secondary | ICD-10-CM | POA: Diagnosis not present

## 2017-07-21 LAB — CBC AND DIFFERENTIAL
HCT: 39 (ref 36–46)
Hemoglobin: 12.7 (ref 12.0–16.0)
Platelets: 310 (ref 150–399)
WBC: 6.2

## 2017-07-21 LAB — BASIC METABOLIC PANEL
BUN: 14 (ref 4–21)
Creatinine: 1 (ref 0.5–1.1)
Glucose: 103
Potassium: 4.6 (ref 3.4–5.3)
Sodium: 139 (ref 137–147)

## 2017-07-29 ENCOUNTER — Ambulatory Visit: Payer: Medicare Other | Admitting: Family Medicine

## 2017-07-31 ENCOUNTER — Encounter: Payer: Self-pay | Admitting: Family Medicine

## 2017-08-03 ENCOUNTER — Ambulatory Visit: Payer: Medicare Other | Admitting: Family Medicine

## 2017-08-06 ENCOUNTER — Encounter: Payer: Self-pay | Admitting: Family Medicine

## 2017-08-12 ENCOUNTER — Ambulatory Visit
Admission: RE | Admit: 2017-08-12 | Discharge: 2017-08-12 | Disposition: A | Discharge status: Home / Self Care | Payer: Medicare Other | Source: Ambulatory Visit | Attending: Unknown Physician Specialty | Admitting: Unknown Physician Specialty

## 2017-08-12 ENCOUNTER — Other Ambulatory Visit: Payer: Self-pay

## 2017-08-12 ENCOUNTER — Ambulatory Visit: Payer: Medicare Other | Admitting: Anesthesiology

## 2017-08-12 ENCOUNTER — Encounter
Admission: RE | Disposition: A | Discharge status: Home / Self Care | Payer: Self-pay | Source: Ambulatory Visit | Attending: Unknown Physician Specialty

## 2017-08-12 DIAGNOSIS — K640 First degree hemorrhoids: Secondary | ICD-10-CM | POA: Diagnosis not present

## 2017-08-12 DIAGNOSIS — Z87891 Personal history of nicotine dependence: Secondary | ICD-10-CM | POA: Diagnosis not present

## 2017-08-12 DIAGNOSIS — Z1211 Encounter for screening for malignant neoplasm of colon: Secondary | ICD-10-CM | POA: Insufficient documentation

## 2017-08-12 DIAGNOSIS — K635 Polyp of colon: Secondary | ICD-10-CM | POA: Diagnosis not present

## 2017-08-12 DIAGNOSIS — D175 Benign lipomatous neoplasm of intra-abdominal organs: Secondary | ICD-10-CM | POA: Insufficient documentation

## 2017-08-12 DIAGNOSIS — Z79899 Other long term (current) drug therapy: Secondary | ICD-10-CM | POA: Diagnosis not present

## 2017-08-12 DIAGNOSIS — Z6839 Body mass index (BMI) 39.0-39.9, adult: Secondary | ICD-10-CM | POA: Insufficient documentation

## 2017-08-12 DIAGNOSIS — E785 Hyperlipidemia, unspecified: Secondary | ICD-10-CM | POA: Insufficient documentation

## 2017-08-12 DIAGNOSIS — K5730 Diverticulosis of large intestine without perforation or abscess without bleeding: Secondary | ICD-10-CM | POA: Insufficient documentation

## 2017-08-12 DIAGNOSIS — D123 Benign neoplasm of transverse colon: Secondary | ICD-10-CM | POA: Insufficient documentation

## 2017-08-12 DIAGNOSIS — N183 Chronic kidney disease, stage 3 (moderate): Secondary | ICD-10-CM | POA: Diagnosis not present

## 2017-08-12 DIAGNOSIS — K5790 Diverticulosis of intestine, part unspecified, without perforation or abscess without bleeding: Secondary | ICD-10-CM | POA: Diagnosis not present

## 2017-08-12 DIAGNOSIS — Z86010 Personal history of colonic polyps: Secondary | ICD-10-CM | POA: Diagnosis not present

## 2017-08-12 DIAGNOSIS — I129 Hypertensive chronic kidney disease with stage 1 through stage 4 chronic kidney disease, or unspecified chronic kidney disease: Secondary | ICD-10-CM | POA: Diagnosis not present

## 2017-08-12 DIAGNOSIS — I10 Essential (primary) hypertension: Secondary | ICD-10-CM | POA: Diagnosis not present

## 2017-08-12 HISTORY — DX: Chronic kidney disease, unspecified: N18.9

## 2017-08-12 HISTORY — PX: COLONOSCOPY WITH PROPOFOL: SHX5780

## 2017-08-12 SURGERY — COLONOSCOPY WITH PROPOFOL
Anesthesia: General

## 2017-08-12 MED ORDER — SODIUM CHLORIDE 0.9 % IV SOLN
INTRAVENOUS | Status: DC
  Administered 2017-08-12 (×2): via INTRAVENOUS

## 2017-08-12 MED ORDER — PROPOFOL 10 MG/ML IV BOLUS
INTRAVENOUS | Status: DC | PRN
  Administered 2017-08-12: 100 mg via INTRAVENOUS

## 2017-08-12 MED ORDER — SODIUM CHLORIDE 0.9 % IV SOLN: INTRAVENOUS | Status: DC

## 2017-08-12 MED ORDER — PHENYLEPHRINE HCL 10 MG/ML IJ SOLN
INTRAMUSCULAR | Status: DC | PRN
  Administered 2017-08-12: 100 ug via INTRAVENOUS

## 2017-08-12 MED ORDER — PROPOFOL 500 MG/50ML IV EMUL
INTRAVENOUS | Status: AC
  Filled 2017-08-12: qty 50

## 2017-08-12 MED ORDER — FENTANYL CITRATE (PF) 100 MCG/2ML IJ SOLN
INTRAMUSCULAR | Status: AC
  Filled 2017-08-12: qty 2

## 2017-08-12 MED ORDER — FENTANYL CITRATE (PF) 100 MCG/2ML IJ SOLN
INTRAMUSCULAR | Status: DC | PRN
  Administered 2017-08-12 (×2): 50 ug via INTRAVENOUS

## 2017-08-12 MED ORDER — PROPOFOL 500 MG/50ML IV EMUL
INTRAVENOUS | Status: DC | PRN
  Administered 2017-08-12: 150 ug/kg/min via INTRAVENOUS

## 2017-08-12 MED ORDER — LIDOCAINE 2% (20 MG/ML) 5 ML SYRINGE
INTRAMUSCULAR | Status: DC | PRN
  Administered 2017-08-12: 40 mg via INTRAVENOUS

## 2017-08-12 NOTE — H&P (Signed)
Primary Care Physician:  Steele Sizer, MD Primary Gastroenterologist:  Dr. Vira Agar  Pre-Procedure History & Physical: HPI:  Tammy Lozano is a 77 y.o. female is here for an colonoscopy. Done for [facility] colon polyps.   Past Medical History:  Diagnosis Date  . Chronic kidney disease    stage 3  . Hyperlipidemia   . Hypertension   . Osteoarthritis     Past Surgical History:  Procedure Laterality Date  . COLONOSCOPY W/ POLYPECTOMY  05/16/1994  . COLONOSCOPY W/ POLYPECTOMY  12/23/2011   adenomatous polyp  . KNEE SURGERY Left 2010   arthroscopic    Prior to Admission medications   Medication Sig Start Date End Date Taking? Authorizing Provider  atorvastatin (LIPITOR) 40 MG tablet Take 1 tablet (40 mg total) by mouth daily. 07/13/17  Yes Sowles, Drue Stager, MD  Cholecalciferol (VITAMIN D-3 PO) Take 1,000 Units by mouth daily.    Yes [provider]  hydrochlorothiazide (HYDRODIURIL) 25 MG tablet Take 1 tablet (25 mg total) by mouth daily. 07/13/17  Yes Sowles, Drue Stager, MD  Omega-3 Fatty Acids (FISH OIL) 1000 MG CAPS Take 1,000 mg by mouth daily.    Yes [provider]  potassium chloride (K-DUR) 10 MEQ tablet Take 1 tablet (10 mEq total) by mouth daily. 12/17/16  Yes Roselee Nova, MD  Potassium Gluconate 595 MG CAPS Take 595 capsules by mouth daily.    Yes [provider]    Allergies as of 07/28/2017  . (No Known Allergies)    Family History  Problem Relation Age of Onset  . Cancer Mother        lung  . Cancer Father        brain  . Aneurysm Sister   . Cancer Brother   . Cancer Brother        esophagus    Social History   Socioeconomic History  . Marital status: Divorced    Spouse name: Not on file  . Number of children: 3  . Years of education: 62  . Highest education level: 12th grade  Occupational History    Employer: RETIRED  Social Needs  . Financial resource strain: Not hard at all  . Food insecurity:    Worry: Never true   Inability: Never true  . Transportation needs:    Medical: No    Non-medical: No  Tobacco Use  . Smoking status: Former Smoker    Packs/day: 2.50    Years: 40.00    Pack years: 100.00    Types: Cigarettes    Last attempt to quit: 2002    Years since quitting: 17.3  . Smokeless tobacco: Never Used  . Tobacco comment: quit in 2002. Has not resumed smoking since 2002. Pt does not require smoking cessation materials.  Substance and Sexual Activity  . Alcohol use: No    Alcohol/week: 0.0 oz  . Drug use: No  . Sexual activity: Never  Lifestyle  . Physical activity:    Days per week: 0 days    Minutes per session: 0 min  . Stress: Not at all  Relationships  . Social connections:    Talks on phone: Not on file    Gets together: Not on file    Attends religious service: Not on file    Active member of club or organization: Not on file    Attends meetings of clubs or organizations: Not on file    Relationship status: Divorced  . Intimate partner violence:  Fear of current or ex partner: No    Emotionally abused: No    Physically abused: No    Forced sexual activity: No  Other Topics Concern  . Not on file  Social History Narrative  . Not on file    Review of Systems: See HPI, otherwise negative ROS  Physical Exam: BP 123/80   Pulse 93   Temp (!) 97 F (36.1 C)   Resp 18   Ht 5\' 3"  (1.6 m)   Wt 100.2 kg (221 lb)   SpO2 97%   BMI 39.15 kg/m  General:   Alert,  pleasant and cooperative in NAD Head:  Normocephalic and atraumatic. Neck:  Supple; no masses or thyromegaly. Lungs:  Clear throughout to auscultation.    Heart:  Regular rate and rhythm. Abdomen:  Soft, nontender and nondistended. Normal bowel sounds, without guarding, and without rebound.   Neurologic:  Alert and  oriented x4;  grossly normal neurologically.  Impression/Plan: Tammy Lozano is here for an colonoscopy to be performed for [facility] of colon polyps  Risks, benefits, limitations, and alternatives  regarding  colonoscopy have been reviewed with the patient.  Questions have been answered.  All parties agreeable.   Gaylyn Cheers, MD  08/12/2017, 11:19 AM

## 2017-08-12 NOTE — Anesthesia Postprocedure Evaluation (Signed)
Anesthesia Post Note  Patient: Tammy Lozano  Procedure(s) Performed: COLONOSCOPY WITH PROPOFOL (N/A )  Patient location during evaluation: PACU Anesthesia Type: General Level of consciousness: awake and alert Pain management: pain level controlled Vital Signs Assessment: post-procedure vital signs reviewed and stable Respiratory status: spontaneous breathing, nonlabored ventilation, respiratory function stable and patient connected to nasal cannula oxygen Cardiovascular status: blood pressure returned to baseline and stable Postop Assessment: no apparent nausea or vomiting Anesthetic complications: no     Last Vitals:  Vitals:   08/12/17 1210 08/12/17 1220  BP: (!) 121/96 134/81  Pulse: 78 76  Resp: 16 (!) 22  Temp:    SpO2: 97% 100%    Last Pain:  Vitals:   08/12/17 1220  PainSc: 0-No pain                 Molli Barrows

## 2017-08-12 NOTE — Op Note (Signed)
[facility] Gastroenterology Patient Name: Tammy Lozano Procedure Date: 08/12/2017 11:21 AM MRN: [id number] Account #: [id number] Date of Birth: 1940/05/18 Admit Type: Outpatient Age: 77 Room: [facility] ENDO ROOM 3 Gender: Female Note Status: Finalized Procedure:            Colonoscopy Indications:          High risk colon cancer surveillance: Personal history                        of colonic polyps Providers:            Manya Silvas, MD Referring MD:         Bethena Roys. Sowles, MD (Referring MD) Medicines:            Propofol per Anesthesia Complications:        No immediate complications. Procedure:            Pre-Anesthesia Assessment:                       - After reviewing the risks and benefits, the patient                        was deemed in satisfactory condition to undergo the                        procedure.                       After obtaining informed consent, the colonoscope was                        passed under direct vision. Throughout the procedure,                        the patient's blood pressure, pulse, and oxygen                        saturations were monitored continuously. The                        Colonoscope was introduced through the anus and                        advanced to the the cecum, identified by appendiceal                        orifice and ileocecal valve. The colonoscopy was                        somewhat difficult due to a tortuous colon. Successful                        completion of the procedure was aided by applying                        abdominal pressure. The patient tolerated the procedure                        well. The quality of the bowel preparation was good. Findings:      A small polyp was found in the transverse colon. The  polyp was sessile.       The polyp was removed with a hot snare. Resection and retrieval were       complete.      A few small-mouthed diverticula were found in the sigmoid colon,        descending colon and transverse colon.      A moderately enlarged Lipoma seen in descending colon.      Internal hemorrhoids were found. The hemorrhoids were small and Grade I       (internal hemorrhoids that do not prolapse). Impression:           - One small polyp in the transverse colon, removed with                        a hot snare. Resected and retrieved.                       - Diverticulosis in the sigmoid colon, in the                        descending colon and in the transverse colon.                       - Internal hemorrhoids. Recommendation:       - Await pathology results. Manya Silvas, MD 08/12/2017 11:49:19 AM This report has been signed electronically. Number of Addenda: 0 Note Initiated On: 08/12/2017 11:21 AM Scope Withdrawal Time: 0 hours 9 minutes 25 seconds  Total Procedure Duration: 0 hours 16 minutes 30 seconds       [facility]

## 2017-08-12 NOTE — Anesthesia Post-op Follow-up Note (Signed)
Anesthesia QCDR form completed.        

## 2017-08-12 NOTE — Transfer of Care (Signed)
Immediate Anesthesia Transfer of Care Note  Patient: Tammy Lozano  Procedure(s) Performed: COLONOSCOPY WITH PROPOFOL (N/A )  Patient Location: PACU and Endoscopy Unit  Anesthesia Type:General  Level of Consciousness: sedated  Airway & Oxygen Therapy: Patient Spontanous Breathing and Patient connected to nasal cannula oxygen  Post-op Assessment: Report given to RN and Post -op Vital signs reviewed and stable  Post vital signs: Reviewed and stable  Last Vitals:  Vitals Value Taken Time  BP    Temp    Pulse    Resp    SpO2      Last Pain: There were no vitals filed for this visit.       Complications: No apparent anesthesia complications

## 2017-08-12 NOTE — Anesthesia Preprocedure Evaluation (Signed)
Anesthesia Evaluation  Patient identified by MRN, date of birth, ID band Patient awake    Reviewed: Allergy & Precautions, H&P , NPO status , Patient's Chart, lab work & pertinent test results, reviewed documented beta blocker date and time   Airway Mallampati: II   Neck ROM: full    Dental  (+) Poor Dentition   Pulmonary neg pulmonary ROS, former smoker,    Pulmonary exam normal        Cardiovascular hypertension, negative cardio ROS Normal cardiovascular exam Rhythm:regular Rate:Normal     Neuro/Psych negative neurological ROS  negative psych ROS   GI/Hepatic negative GI ROS, Neg liver ROS,   Endo/Other  negative endocrine ROSMorbid obesity  Renal/GU Renal diseasenegative Renal ROS  negative genitourinary   Musculoskeletal   Abdominal   Peds  Hematology negative hematology ROS (+)   Anesthesia Other Findings Past Medical History: No date: Chronic kidney disease     Comment:  stage 3 No date: Hyperlipidemia No date: Hypertension No date: Osteoarthritis Past Surgical History: 05/16/1994: COLONOSCOPY W/ POLYPECTOMY 12/23/2011: COLONOSCOPY W/ POLYPECTOMY     Comment:  adenomatous polyp 2010: KNEE SURGERY; Left     Comment:  arthroscopic BMI    Body Mass Index:  39.15 kg/m     Reproductive/Obstetrics negative OB ROS                             Anesthesia Physical Anesthesia Plan  ASA: III  Anesthesia Plan: General   Post-op Pain Management:    Induction:   PONV Risk Score and Plan:   Airway Management Planned:   Additional Equipment:   Intra-op Plan:   Post-operative Plan:   Informed Consent: I have reviewed the patients History and Physical, chart, labs and discussed the procedure including the risks, benefits and alternatives for the proposed anesthesia with the patient or authorized representative who has indicated his/her understanding and acceptance.   Dental  Advisory Given  Plan Discussed with: CRNA  Anesthesia Plan Comments:         Anesthesia Quick Evaluation

## 2017-08-13 ENCOUNTER — Encounter: Payer: Self-pay | Admitting: Unknown Physician Specialty

## 2017-08-13 LAB — SURGICAL PATHOLOGY

## 2017-08-25 ENCOUNTER — Other Ambulatory Visit: Payer: Self-pay

## 2017-08-25 DIAGNOSIS — I10 Essential (primary) hypertension: Secondary | ICD-10-CM

## 2017-08-25 MED ORDER — HYDROCHLOROTHIAZIDE 25 MG PO TABS: 25.0000 mg | ORAL_TABLET | Freq: Every day | ORAL | 1 refills | Status: DC

## 2017-08-25 NOTE — Telephone Encounter (Signed)
Hypertension medication request: HCTZ to [facility]   Last office visit pertaining to hypertension: 04/20/2017 with Dr. Manuella Ghazi  BP Readings from Last 3 Encounters:  08/12/17 134/81  04/20/17 110/68  03/20/17 110/82    Lab Results  Component Value Date   CREATININE 1.0 07/21/2017   BUN 14 07/21/2017   NA 139 07/21/2017   K 4.6 07/21/2017   CL 98 12/15/2016   CO2 34 (H) 12/15/2016    Follow up on 11/04/2017

## 2017-09-24 ENCOUNTER — Other Ambulatory Visit: Payer: Self-pay | Admitting: Family Medicine

## 2017-09-24 DIAGNOSIS — I10 Essential (primary) hypertension: Secondary | ICD-10-CM

## 2017-09-24 DIAGNOSIS — E785 Hyperlipidemia, unspecified: Secondary | ICD-10-CM

## 2017-09-24 NOTE — Telephone Encounter (Signed)
Copied from [place] [id number]. Topic: Quick Communication - Rx Refill/Question >> Sep 24, 2017  1:13 PM Burchel, Abbi R wrote: Medication: atorvastatin (LIPITOR) 40 MG tablet    hydrochlorothiazide (HYDRODIURIL) 25 MG tablet    Has the patient contacted their pharmacy? No.   Preferred Pharmacy:  [facility] [address], [place] - [place] [address] Phone: [phone] Fax: [phone] Not a 24 hour pharmacy; exact hours not known  Pt is requesting meds to get through to appt w/Dr Sowles on 11/04/17.  Pt was advised that RX refills may take up to 3 business days.

## 2017-09-25 MED ORDER — ATORVASTATIN CALCIUM 40 MG PO TABS: 40.0000 mg | ORAL_TABLET | Freq: Every day | ORAL | 0 refills | Status: DC

## 2017-09-25 MED ORDER — HYDROCHLOROTHIAZIDE 25 MG PO TABS: 25.0000 mg | ORAL_TABLET | Freq: Every day | ORAL | 1 refills | Status: DC

## 2017-09-25 NOTE — Telephone Encounter (Signed)
Refill request for Hypertension medication:  HCTZ 25 mg  Last office visit pertaining to hypertension: 04/20/2017  BP Readings from Last 3 Encounters:  08/12/17 134/81  04/20/17 110/68  03/20/17 110/82    Lab Results  Component Value Date   CREATININE 1.0 07/21/2017   BUN 14 07/21/2017   NA 139 07/21/2017   K 4.6 07/21/2017   CL 98 12/15/2016   CO2 34 (H) 12/15/2016   Follow-ups on file. 11/04/2017  Refill Request for Cholesterol medication. Lipitor 40 mg   Lab Results  Component Value Date   CHOL 161 12/15/2016   HDL 45 (L) 12/15/2016   LDLCALC 97 12/15/2016   TRIG 98 12/15/2016   CHOLHDL 3.6 12/15/2016

## 2017-11-04 ENCOUNTER — Ambulatory Visit (INDEPENDENT_AMBULATORY_CARE_PROVIDER_SITE_OTHER): Payer: Medicare Other | Admitting: Family Medicine

## 2017-11-04 ENCOUNTER — Encounter

## 2017-11-04 ENCOUNTER — Encounter: Payer: Self-pay | Admitting: Family Medicine

## 2017-11-04 VITALS — BP 122/76 | HR 106 | Temp 98.4°F | Resp 16 | Ht 64.0 in | Wt 218.1 lb

## 2017-11-04 DIAGNOSIS — N183 Chronic kidney disease, stage 3 unspecified: Secondary | ICD-10-CM

## 2017-11-04 DIAGNOSIS — I10 Essential (primary) hypertension: Secondary | ICD-10-CM

## 2017-11-04 DIAGNOSIS — E1169 Type 2 diabetes mellitus with other specified complication: Secondary | ICD-10-CM | POA: Diagnosis not present

## 2017-11-04 DIAGNOSIS — E559 Vitamin D deficiency, unspecified: Secondary | ICD-10-CM

## 2017-11-04 DIAGNOSIS — E669 Obesity, unspecified: Secondary | ICD-10-CM

## 2017-11-04 DIAGNOSIS — Z23 Encounter for immunization: Secondary | ICD-10-CM

## 2017-11-04 DIAGNOSIS — M2040 Other hammer toe(s) (acquired), unspecified foot: Secondary | ICD-10-CM

## 2017-11-04 DIAGNOSIS — E785 Hyperlipidemia, unspecified: Secondary | ICD-10-CM | POA: Diagnosis not present

## 2017-11-04 LAB — POCT GLYCOSYLATED HEMOGLOBIN (HGB A1C): Hemoglobin A1C: 6.3 % — AB (ref 4.0–5.6)

## 2017-11-04 MED ORDER — ATORVASTATIN CALCIUM 40 MG PO TABS: 40.0000 mg | ORAL_TABLET | Freq: Every day | ORAL | 0 refills | Status: DC

## 2017-11-04 MED ORDER — VITAMIN D-3 25 MCG (1000 UT) PO CAPS: 1000.0000 [IU] | ORAL_CAPSULE | Freq: Every day | ORAL | 0 refills | Status: AC

## 2017-11-04 MED ORDER — LOSARTAN POTASSIUM-HCTZ 50-12.5 MG PO TABS: 1.0000 | ORAL_TABLET | Freq: Every day | ORAL | 1 refills | Status: DC

## 2017-11-04 NOTE — Progress Notes (Signed)
Name: Tammy Lozano   MRN: [id number]    DOB: 1940-07-17   Date:11/04/2017       Progress Note  Subjective  Chief Complaint  Chief Complaint  Patient presents with  . Medication Refill    6 month F/U  . Hyperlipidemia  . Hypertension    Denies any symptoms  . Diabetes    HPI  Diabetes type II: diagnosed October 2018 with hgbA1C of 6.6%. She states recently seen by eye doctor and has mild cataracts. She denies polyphagia, polydipsia or polyuria. She is not taking aspirin and is 77 yo , therefore we will not start it now. She is obese, and discussed healthy diet. She refuses referral to dietician.   Hyperlipidemia: she takes Atorvastatin, no myalgias, compliant with medication  HTN: she takes hctz, states ace caused a cough, advised to stop taking potassium otc, no chest pain or palpitation.   Morbid obesity: she states her weight was normal after her children , but gained weight in 1982 after she lost her mother in 50. She states not because of depression, just likes to eat.   CKI: sees Dr. Candiss Norse and is not on ARB, we will check labs, on vitamin D otc. Normal urine output and no pruritis.   Patient Active Problem List   Diagnosis Date Noted  . Morbid obesity ([place]) 11/04/2017  . Osteopenia of left hip 06/01/2017  . Controlled type 2 diabetes mellitus with chronic kidney disease ([place]) 11/28/2015  . CKD (chronic kidney disease) stage 3, GFR 30-59 ml/min (HCC) 11/28/2015  . Hypertension 12/04/2014  . Hyperlipidemia 12/04/2014    Past Surgical History:  Procedure Laterality Date  . COLONOSCOPY W/ POLYPECTOMY  05/16/1994  . COLONOSCOPY W/ POLYPECTOMY  12/23/2011   adenomatous polyp  . COLONOSCOPY WITH PROPOFOL N/A 08/12/2017   Procedure: COLONOSCOPY WITH PROPOFOL;  Surgeon: Manya Silvas, MD;  Location: [facility] ENDOSCOPY;  Service: Endoscopy;  Laterality: N/A;  . KNEE SURGERY Left 2010   arthroscopic    Family History  Problem Relation Age of Onset  . Cancer Mother    lung  . Cancer Father        brain  . Aneurysm Sister   . Cancer Brother   . Cancer Brother        esophagus    Social History   Socioeconomic History  . Marital status: Divorced    Spouse name: Not on file  . Number of children: 3  . Years of education: 7  . Highest education level: 12th grade  Occupational History    Employer: RETIRED  Social Needs  . Financial resource strain: Not hard at all  . Food insecurity:    Worry: Never true    Inability: Never true  . Transportation needs:    Medical: No    Non-medical: No  Tobacco Use  . Smoking status: Former Smoker    Packs/day: 2.50    Years: 40.00    Pack years: 100.00    Types: Cigarettes    Last attempt to quit: 2002    Years since quitting: 17.5  . Smokeless tobacco: Never Used  . Tobacco comment: quit in 2002. Has not resumed smoking since 2002. Pt does not require smoking cessation materials.  Substance and Sexual Activity  . Alcohol use: No    Alcohol/week: 0.0 oz  . Drug use: No  . Sexual activity: Never  Lifestyle  . Physical activity:    Days per week: 0 days    Minutes per session:  0 min  . Stress: Not at all  Relationships  . Social connections:    Talks on phone: Not on file    Gets together: Not on file    Attends religious service: Not on file    Active member of club or organization: Not on file    Attends meetings of clubs or organizations: Not on file    Relationship status: Divorced  . Intimate partner violence:    Fear of current or ex partner: No    Emotionally abused: No    Physically abused: No    Forced sexual activity: No  Other Topics Concern  . Not on file  Social History Narrative  . Not on file     Current Outpatient Medications:  .  atorvastatin (LIPITOR) 40 MG tablet, Take 1 tablet (40 mg total) by mouth daily., Disp: 90 tablet, Rfl: 0 .  Omega-3 Fatty Acids (FISH OIL) 1000 MG CAPS, Take 1,000 mg by mouth daily. , Disp: , Rfl:  .  Cholecalciferol (VITAMIN D-3) 1000  units CAPS, Take 1 capsule (1,000 Units total) by mouth daily., Disp: 30 capsule, Rfl: 0 .  losartan-hydrochlorothiazide (HYZAAR) 50-12.5 MG tablet, Take 1 tablet by mouth daily., Disp: 90 tablet, Rfl: 1 .  Potassium Gluconate 595 MG CAPS, Take 595 capsules by mouth daily. , Disp: , Rfl:   Allergies  Allergen Reactions  . Ace Inhibitors     cough     ROS  Constitutional: Negative for fever or weight change.  Respiratory: Negative for cough and shortness of breath.   Cardiovascular: Negative for chest pain or palpitations.  Gastrointestinal: Negative for abdominal pain, no bowel changes.  Musculoskeletal: Positive  for gait problem but  joint swelling.  Skin: Negative for rash.  Neurological: Negative for dizziness or headache.  No other specific complaints in a complete review of systems (except as listed in HPI above).  Objective  Vitals:   11/04/17 1036  BP: 122/76  Pulse: (!) 106  Resp: 16  Temp: 98.4 F (36.9 C)  TempSrc: Oral  SpO2: 96%  Weight: 218 lb 1.6 oz (98.9 kg)  Height: 5\' 4"  (3.267 m)    Body mass index is 37.44 kg/m.  Physical Exam  Constitutional: Patient appears well-developed and well-nourished. Obese No distress.  HEENT: head atraumatic, normocephalic, pupils equal and reactive to light, neck supple, throat within normal limits Cardiovascular: Normal rate, regular rhythm and normal heart sounds.  No murmur heard. No BLE edema. Pulmonary/Chest: Effort normal and breath sounds normal. No respiratory distress. Abdominal: Soft.  There is no tenderness. Psychiatric: Patient has a normal mood and affect. behavior is normal. Judgment and thought content normal. Muscular Skeletal: she uses a walker. Pain during rom of left hip   Recent Results (from the past 2160 hour(s))  Surgical pathology     Status: None   Collection Time: 08/12/17 11:41 AM  Result Value Ref Range   SURGICAL PATHOLOGY      Surgical Pathology CASE: [id number] PATIENT: Tammy Lozano Surgical Pathology Report     SPECIMEN SUBMITTED: A. Colon polyp, transverse; hot snare  CLINICAL HISTORY: None provided  PRE-OPERATIVE DIAGNOSIS: PH CP  POST-OPERATIVE DIAGNOSIS: Diverticulosis, colon polyp     DIAGNOSIS: A.  COLON POLYP, TRANSVERSE; HOT SNARE: - SESSILE SERRATED ADENOMA. - NEGATIVE FOR CYTOLOGIC DYSPLASIA AND MALIGNANCY.   GROSS DESCRIPTION: A. Labeled: Hot snare transverse colon polyp Received: In formalin Tissue fragment(s): 1 Size: 0.5 cm Description: Tan polypoid fragment, inked blue and bisected Entirely submitted  in one cassette.     Final Diagnosis performed by Bryan Lemma, MD.   Electronically signed 08/13/2017 7:58:00PM The electronic signature indicates that the named Attending Pathologist has evaluated the specimen  Technical component performed at [facility], [address] [place], [place] [address] Lab: [phone] Dir: Rush Farmer, MD, MMM  Professional  component performed at [facility], [facility], [place], [place], [place] [address] Lab: [phone] Dir: Dellia Nims. Rubinas, MD   POCT HgB A1C     Status: Abnormal   Collection Time: 11/04/17 11:33 AM  Result Value Ref Range   Hemoglobin A1C 6.3 (A) 4.0 - 5.6 %   HbA1c POC (<> result, manual entry)  4.0 - 5.6 %   HbA1c, POC (prediabetic range)  5.7 - 6.4 %   HbA1c, POC (controlled diabetic range)  0.0 - 7.0 %    Diabetic Foot Exam: Diabetic Foot Exam - Simple   Simple Foot Form Diabetic Foot exam was performed with the following findings:  Yes 11/04/2017 11:25 AM  Visual Inspection See comments:  Yes Sensation Testing Intact to touch and monofilament testing bilaterally:  Yes Pulse Check Posterior Tibialis and Dorsalis pulse intact bilaterally:  Yes Comments Thick toenails, bunion and hammer toe      PHQ2/9: Depression screen [facility] 2/9 11/04/2017 04/20/2017 03/20/2017 12/15/2016 06/13/2016  Decreased Interest 0 0 0 0 0  Down, Depressed,  Hopeless 0 0 0 0 0  PHQ - 2 Score 0 0 0 0 0     Fall Risk: Fall Risk  11/04/2017 04/20/2017 03/20/2017 12/15/2016 06/13/2016  Falls in the past year? No No No No No  Risk for fall due to : - - Other (Comment);Medication side effect - -  Risk for fall due to: Comment - - use of walker, takes diuretic - -     Functional Status Survey: Is the patient deaf or have difficulty hearing?: No Does the patient have difficulty seeing, even when wearing glasses/contacts?: Yes(glasses) Does the patient have difficulty concentrating, remembering, or making decisions?: No Does the patient have difficulty walking or climbing stairs?: Yes Does the patient have difficulty dressing or bathing?: Yes Does the patient have difficulty doing errands alone such as visiting a doctor's office or shopping?: No    Assessment & Plan  1. Diabetes mellitus type 2 in obese (HCC)  - POCT HgB A1C - POCT UA - Microalbumin  2. Morbid obesity ([place])  Discussed with the patient the risk posed by an increased BMI. Discussed importance of portion control, calorie counting and at least 150 minutes of physical activity weekly. Avoid sweet beverages and drink more water. Eat at least 6 servings of fruit and vegetables daily   3. Hyperlipidemia, unspecified hyperlipidemia type  - atorvastatin (LIPITOR) 40 MG tablet; Take 1 tablet (40 mg total) by mouth daily.  Dispense: 90 tablet; Refill: 0 - Lipid panel  4. Essential hypertension  - losartan-hydrochlorothiazide (HYZAAR) 50-12.5 MG tablet; Take 1 tablet by mouth daily.  Dispense: 90 tablet; Refill: 1  5. CKD (chronic kidney disease) stage 3, GFR 30-59 ml/min (HCC)  - losartan-hydrochlorothiazide (HYZAAR) 50-12.5 MG tablet; Take 1 tablet by mouth daily.  Dispense: 90 tablet; Refill: 1 - COMPLETE METABOLIC PANEL WITH GFR - Hemoglobin and hematocrit, blood  6. Vitamin D deficiency  - Cholecalciferol (VITAMIN D-3) 1000 units CAPS; Take 1 capsule (1,000 Units total) by  mouth daily.  Dispense: 30 capsule; Refill: 0   7. Need for Tdap vaccination  Refused   8. Need for  pneumococcal vaccine   she agrees to get it today  - PCV 23   9. Acquired hammer toe  - Ambulatory referral to Podiatry

## 2017-11-05 ENCOUNTER — Other Ambulatory Visit: Payer: Self-pay

## 2017-11-05 LAB — COMPLETE METABOLIC PANEL WITH GFR
AG Ratio: 1.6 (calc) (ref 1.0–2.5)
ALT: 12 U/L (ref 6–29)
AST: 16 U/L (ref 10–35)
Albumin: 4.4 g/dL (ref 3.6–5.1)
Alkaline phosphatase (APISO): 88 U/L (ref 33–130)
BUN/Creatinine Ratio: 15 (calc) (ref 6–22)
BUN: 15 mg/dL (ref 7–25)
CO2: 31 mmol/L (ref 20–32)
Calcium: 9.7 mg/dL (ref 8.6–10.4)
Chloride: 100 mmol/L (ref 98–110)
Creat: 1.03 mg/dL — ABNORMAL HIGH (ref 0.60–0.93)
GFR, Est African American: 60 mL/min/{1.73_m2} (ref >=60)
GFR, Est Non African American: 52 mL/min/{1.73_m2} — ABNORMAL LOW (ref >=60)
Globulin: 2.7 g/dL (calc) (ref 1.9–3.7)
Glucose, Bld: 109 mg/dL — ABNORMAL HIGH (ref 65–99)
Potassium: 3.4 mmol/L — ABNORMAL LOW (ref 3.5–5.3)
Sodium: 141 mmol/L (ref 135–146)
Total Bilirubin: 1.2 mg/dL (ref 0.2–1.2)
Total Protein: 7.1 g/dL (ref 6.1–8.1)

## 2017-11-05 LAB — LIPID PANEL
Cholesterol: 165 mg/dL (ref <200)
HDL: 47 mg/dL — ABNORMAL LOW (ref >50)
LDL Cholesterol (Calc): 97 mg/dL (calc)
Non-HDL Cholesterol (Calc): 118 mg/dL (calc) (ref <130)
Total CHOL/HDL Ratio: 3.5 (calc) (ref <5.0)
Triglycerides: 110 mg/dL (ref <150)

## 2017-11-05 LAB — HEMOGLOBIN AND HEMATOCRIT, BLOOD
HCT: 43 % (ref 35.0–45.0)
Hemoglobin: 14.5 g/dL (ref 11.7–15.5)

## 2017-11-05 MED ORDER — LOSARTAN POTASSIUM 50 MG PO TABS: 50.0000 mg | ORAL_TABLET | Freq: Every day | ORAL | 1 refills | Status: DC

## 2017-11-05 MED ORDER — HYDROCHLOROTHIAZIDE 12.5 MG PO TABS: 12.5000 mg | ORAL_TABLET | Freq: Every day | ORAL | 1 refills | Status: DC

## 2017-11-05 NOTE — Telephone Encounter (Signed)
We received a fax from [facility] stating that the requested drug is on back order and 2 separate rx is needed  Refill request was sent to Dr. Steele Sizer for approval and submission.

## 2017-11-06 ENCOUNTER — Other Ambulatory Visit: Payer: Self-pay | Admitting: Family Medicine

## 2017-11-30 ENCOUNTER — Encounter: Payer: Self-pay | Admitting: Podiatry

## 2017-11-30 ENCOUNTER — Ambulatory Visit (INDEPENDENT_AMBULATORY_CARE_PROVIDER_SITE_OTHER): Payer: Medicare Other | Admitting: Podiatry

## 2017-11-30 DIAGNOSIS — M79674 Pain in right toe(s): Secondary | ICD-10-CM

## 2017-11-30 DIAGNOSIS — B351 Tinea unguium: Secondary | ICD-10-CM | POA: Diagnosis not present

## 2017-11-30 DIAGNOSIS — M79675 Pain in left toe(s): Secondary | ICD-10-CM

## 2017-11-30 DIAGNOSIS — E1159 Type 2 diabetes mellitus with other circulatory complications: Secondary | ICD-10-CM

## 2017-11-30 NOTE — Progress Notes (Signed)
Complaint:  Visit Type: Patient returns to my office for continued preventative foot care services. Complaint: Patient states" my nails have grown long and thick and become painful to walk and wear shoes" Patient has been diagnosed with DM with no foot complications. The patient presents for preventative foot care services. No changes to ROS  Podiatric Exam: Vascular: dorsalis pedis are palpable  Bilateral.  PT pulses are absent  B/L. Capillary return is immediate. Temperature gradient is WNL. Skin turgor WNL  Sensorium: Normal Semmes Weinstein monofilament test. Normal tactile sensation bilaterally. Nail Exam: Pt has thick disfigured discolored nails with subungual debris noted left foot. entire nail hallux through fifth toenails.  Thick mycotic nails  Hallux right. Ulcer Exam: There is no evidence of ulcer or pre-ulcerative changes or infection. Orthopedic Exam: Muscle tone and strength are WNL. No limitations in general ROM. No crepitus or effusions noted. Foot type and digits show no abnormalities. Bony prominences are unremarkable. Skin: No Porokeratosis. No infection or ulcers  Diagnosis:  Onychomycosis, , Pain in right toe, pain in left toes  Diabetes with vascular disease.  Treatment & Plan Procedures and Treatment: Consent by patient was obtained for treatment procedures.   Debridement of mycotic and hypertrophic toenails, 1 through 5 bilateral and clearing of subungual debris. No ulceration, no infection noted.  Return Visit-Office Procedure: Patient instructed to return to the office for a follow up visit 3 months for continued evaluation and treatment.    Gardiner Barefoot DPM

## 2017-12-26 ENCOUNTER — Other Ambulatory Visit: Payer: Self-pay

## 2017-12-26 ENCOUNTER — Emergency Department: Payer: Medicare Other

## 2017-12-26 ENCOUNTER — Encounter: Payer: Self-pay | Admitting: Emergency Medicine

## 2017-12-26 ENCOUNTER — Emergency Department
Admission: EM | Admit: 2017-12-26 | Discharge: 2017-12-26 | Disposition: A | Discharge status: Home / Self Care | Payer: Medicare Other | Attending: Student in an Organized Health Care Education/Training Program | Admitting: Student in an Organized Health Care Education/Training Program

## 2017-12-26 DIAGNOSIS — M159 Polyosteoarthritis, unspecified: Secondary | ICD-10-CM | POA: Diagnosis not present

## 2017-12-26 DIAGNOSIS — Z79899 Other long term (current) drug therapy: Secondary | ICD-10-CM | POA: Diagnosis not present

## 2017-12-26 DIAGNOSIS — E1122 Type 2 diabetes mellitus with diabetic chronic kidney disease: Secondary | ICD-10-CM | POA: Diagnosis not present

## 2017-12-26 DIAGNOSIS — M1711 Unilateral primary osteoarthritis, right knee: Secondary | ICD-10-CM | POA: Diagnosis not present

## 2017-12-26 DIAGNOSIS — N183 Chronic kidney disease, stage 3 (moderate): Secondary | ICD-10-CM | POA: Insufficient documentation

## 2017-12-26 DIAGNOSIS — M79661 Pain in right lower leg: Secondary | ICD-10-CM | POA: Diagnosis not present

## 2017-12-26 DIAGNOSIS — R52 Pain, unspecified: Secondary | ICD-10-CM

## 2017-12-26 DIAGNOSIS — M25551 Pain in right hip: Secondary | ICD-10-CM | POA: Insufficient documentation

## 2017-12-26 DIAGNOSIS — Z87891 Personal history of nicotine dependence: Secondary | ICD-10-CM | POA: Insufficient documentation

## 2017-12-26 DIAGNOSIS — I129 Hypertensive chronic kidney disease with stage 1 through stage 4 chronic kidney disease, or unspecified chronic kidney disease: Secondary | ICD-10-CM | POA: Diagnosis not present

## 2017-12-26 DIAGNOSIS — M1611 Unilateral primary osteoarthritis, right hip: Secondary | ICD-10-CM | POA: Diagnosis not present

## 2017-12-26 MED ORDER — PREDNISONE 10 MG PO TABS: ORAL_TABLET | ORAL | 0 refills | Status: DC

## 2017-12-26 MED ORDER — HYDROCODONE-ACETAMINOPHEN 5-325 MG PO TABS: 1.0000 | ORAL_TABLET | Freq: Three times a day (TID) | ORAL | 0 refills | Status: DC | PRN

## 2017-12-26 MED ORDER — HYDROCODONE-ACETAMINOPHEN 5-325 MG PO TABS
1.0000 | ORAL_TABLET | Freq: Once | ORAL | Status: AC
  Administered 2017-12-26: 1 via ORAL
  Filled 2017-12-26: qty 1

## 2017-12-26 NOTE — ED Triage Notes (Signed)
Pain R hip to foot x 2 weeks. Denies fall or injury.

## 2017-12-26 NOTE — ED Provider Notes (Signed)
[facility] Emergency Department Provider Note   ____________________________________________   First MD Initiated Contact with Patient 12/26/17 1249     (approximate)  I have reviewed the triage vital signs and the nursing notes.   HISTORY  Chief Complaint Hip Pain   HPI Tammy Lozano is a 77 y.o. female to the ED with complaint of right hip pain radiating down to her foot for the last 2 weeks.  She denies any history of fall or injury.  She states she has taken a pain medication that her doctor prescribed for her long time ago without any relief of her pain.  Currently she rates her pain as a 5/10.  Patient is using a cane for assistance.   Past Medical History:  Diagnosis Date  . Chronic kidney disease    stage 3  . Hyperlipidemia   . Hypertension   . Osteoarthritis     Patient Active Problem List   Diagnosis Date Noted  . Morbid obesity ([place]) 11/04/2017  . Osteopenia of left hip 06/01/2017  . Controlled type 2 diabetes mellitus with chronic kidney disease ([place]) 11/28/2015  . CKD (chronic kidney disease) stage 3, GFR 30-59 ml/min (HCC) 11/28/2015  . Hypertension 12/04/2014  . Hyperlipidemia 12/04/2014    Past Surgical History:  Procedure Laterality Date  . COLONOSCOPY W/ POLYPECTOMY  05/16/1994  . COLONOSCOPY W/ POLYPECTOMY  12/23/2011   adenomatous polyp  . COLONOSCOPY WITH PROPOFOL N/A 08/12/2017   Procedure: COLONOSCOPY WITH PROPOFOL;  Surgeon: Manya Silvas, MD;  Location: [facility] ENDOSCOPY;  Service: Endoscopy;  Laterality: N/A;  . KNEE SURGERY Left 2010   arthroscopic    Prior to Admission medications   Medication Sig Start Date End Date Taking? Authorizing Provider  atorvastatin (LIPITOR) 40 MG tablet Take 1 tablet (40 mg total) by mouth daily. 11/04/17   Steele Sizer, MD  Cholecalciferol (VITAMIN D-3) 1000 units CAPS Take 1 capsule (1,000 Units total) by mouth daily. 11/04/17   Steele Sizer, MD  hydrochlorothiazide  (HYDRODIURIL) 12.5 MG tablet Take 1 tablet (12.5 mg total) by mouth daily. 11/05/17   Hubbard Hartshorn, FNP  HYDROcodone-acetaminophen (NORCO/VICODIN) 5-325 MG tablet Take 1 tablet by mouth every 8 (eight) hours as needed for moderate pain. 12/26/17   Johnn Hai, PA-C  losartan (COZAAR) 50 MG tablet Take 1 tablet (50 mg total) by mouth daily. 11/05/17   Hubbard Hartshorn, FNP  Omega-3 Fatty Acids (FISH OIL) 1000 MG CAPS Take 1,000 mg by mouth daily.     [provider]  predniSONE (DELTASONE) 10 MG tablet Take 3 tablets once a day for 5 days 12/26/17   Johnn Hai, PA-C    Allergies Ace inhibitors  Family History  Problem Relation Age of Onset  . Cancer Mother        lung  . Cancer Father        brain  . Aneurysm Sister   . Cancer Brother   . Cancer Brother        esophagus    Social History Social History   Tobacco Use  . Smoking status: Former Smoker    Packs/day: 2.50    Years: 40.00    Pack years: 100.00    Types: Cigarettes    Last attempt to quit: 2002    Years since quitting: 17.7  . Smokeless tobacco: Never Used  . Tobacco comment: quit in 2002. Has not resumed smoking since 2002. Pt does not require smoking cessation materials.  Substance Use Topics  . Alcohol use: No    Alcohol/week: 0.0 standard drinks  . Drug use: No    Review of Systems Constitutional: No fever/chills Cardiovascular: Denies chest pain. Respiratory: Denies shortness of breath. Musculoskeletal: Positive for right hip and knee pain. Skin: Negative for rash. Neurological: Negative for headaches, focal weakness or numbness. ____________________________________________   PHYSICAL EXAM:  VITAL SIGNS: ED Triage Vitals  Enc Vitals Group     BP 12/26/17 1238 99/74     Pulse Rate 12/26/17 1238 86     Resp 12/26/17 1238 18     Temp 12/26/17 1238 97.8 F (36.6 C)     Temp Source 12/26/17 1238 Oral     SpO2 12/26/17 1238 98 %     Weight 12/26/17 1239 218 lb (98.9 kg)      Height 12/26/17 1239 5\' 3"  (1.6 m)     Head Circumference --      Peak Flow --      Pain Score 12/26/17 1239 5     Pain Loc --      Pain Edu? --      Excl. in [place]? --    Constitutional: Alert and oriented. Well appearing and in no acute distress.  She is pleasant and talkative.  Obese. Eyes: Conjunctivae are normal. PERRL. EOMI. Head: Atraumatic. Neck: No stridor.   Cardiovascular: Normal rate, regular rhythm. Grossly normal heart sounds.  Good peripheral circulation. Respiratory: Normal respiratory effort.  No retractions. Lungs CTAB. Gastrointestinal: Soft and nontender. No distention.  Musculoskeletal: There is marked to moderate tenderness on palpation over the right hip with range of motion producing pain.  She denies any pain on palpation of the femur and no evidence of injury was noted.  Examination of the knee there is degenerative changes present but no point tenderness on palpation.  There is some crepitus noted with range of motion and no effusion is present.  No erythema or warmth is appreciated.  On examination of the lower tib-fib there is no soft tissue edema present.  Motor sensory function intact.  Skin intact and without discoloration.  Patient is ambulatory with cane but gait was not examined secondary to patient's pain.  Homans sign is negative and patient is at low risk and suspicion for a DVT. Neurologic:  Normal speech and language. No gross focal neurologic deficits are appreciated.  Skin:  Skin is warm, dry and intact.  No erythema, ecchymosis or skin changes to suggest injury. Psychiatric: Mood and affect are normal. Speech and behavior are normal.  ____________________________________________   LABS (all labs ordered are listed, but only abnormal results are displayed)  Labs Reviewed - No data to display  RADIOLOGY  ED MD interpretation:   Pelvis shows severe osteoarthritis of both hips.  Right tib-fib no acute bony injury was noted.  Official radiology  report(s): Dg Pelvis 1-2 Views  Result Date: 12/26/2017 CLINICAL DATA:  Pain radiating from right hip into right lower leg. EXAM: PELVIS - 1-2 VIEW COMPARISON:  None. FINDINGS: Severe osteoarthritis noted of both hip joints with almost complete superior joint space loss bilaterally. Associated significant proliferative disease involving the acetabula. No visible fracture, dislocation or bony lesions. IMPRESSION: Severe osteoarthritis of both hip joints. Electronically Signed   By: Aletta Edouard M.D.   On: 12/26/2017 13:35   Dg Tibia/fibula Right  Result Date: 12/26/2017 CLINICAL DATA:  Pain of right lower leg. EXAM: RIGHT TIBIA AND FIBULA - 2 VIEW COMPARISON:  None. FINDINGS: No fracture or  dislocation identified. Lateral joint space narrowing and lateral chondrocalcinosis evident in the right knee. The ankle shows no significant degenerative disease. No bony lesions identified. Soft tissues are unremarkable. 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 Electronically Signed   By: Aletta Edouard M.D.   On: 12/26/2017 13:37   Dg Femur Min 2 Views Right  Result Date: 12/26/2017 CLINICAL DATA:  Right lower extremity pain. EXAM: RIGHT FEMUR 2 VIEWS COMPARISON:  None. FINDINGS: The right hip joint demonstrates severe osteoarthritis. No fracture or dislocation identified. No bony lesions. Atherosclerotic calcification noted in the superficial femoral artery and popliteal artery. No bony lesions. Soft tissues are unremarkable. IMPRESSION: Severe osteoarthritis of the right hip joint. Atherosclerosis of the SFA and popliteal arteries. Electronically Signed   By: Aletta Edouard M.D.   On: 12/26/2017 13:38   ____________________________________________   PROCEDURES  Procedure(s) performed: None  Procedures  Critical Care performed: No  ____________________________________________   INITIAL IMPRESSION / ASSESSMENT AND PLAN / ED COURSE  As part of my medical  decision making, I reviewed the following data within the electronic MEDICAL RECORD NUMBER Notes from prior ED visits and [place] Controlled Substance Database  Patient presents to the ED with complaint of right hip pain radiating down to her foot for the last 2 weeks.  She denies any injury.  She has taken some over-the-counter medication and including some leftover pain medication without any relief.  Patient denies any previous problems with her right hip.  X-rays confirmed severe osteoarthritis which was suspected on her physical exam.  Patient was made aware.  She was given a Norco while in the department because family members were present.  She is aware that this medication could cause drowsiness and increase her risk for injury.  She is also given prednisone 30 mg once a day for the next 5 days to help alleviate some of her severe pain from her osteoarthritis.  She is encouraged to contact her PCP or the orthopedist at [facility] to discuss surgical intervention.  ____________________________________________   FINAL CLINICAL IMPRESSION(S) / ED DIAGNOSES  Final diagnoses:  Acute hip pain, right  Osteoarthritis of multiple joints, unspecified osteoarthritis type     ED Discharge Orders         Ordered    predniSONE (DELTASONE) 10 MG tablet     12/26/17 1434    HYDROcodone-acetaminophen (NORCO/VICODIN) 5-325 MG tablet  Every 8 hours PRN     12/26/17 1434           Note:  This document was prepared using Dragon voice recognition software and may include unintentional dictation errors.    Johnn Hai, PA-C 12/26/17 1456    Merlyn Lot, MD 12/26/17 1535

## 2017-12-26 NOTE — Discharge Instructions (Signed)
Follow-up with your primary care provider or make an appointment with Dr. Posey Pronto at the [facility] orthopedic department.  Your x-rays revealed that you have severe osteoarthritis.  Begin taking prednisone 3 tablets once a day for the next 5 days.  This medication could disturb your sleep pattern but will reduce your inflammation quickly.  The Norco is 1 every 8 hours.  This medication could cause drowsiness and increase your risk for falling.  Do not take this medication if you plan on leaving the house.  Use your cane for added support when you are up walking.

## 2017-12-30 ENCOUNTER — Telehealth: Payer: Self-pay | Admitting: Family Medicine

## 2017-12-30 DIAGNOSIS — E785 Hyperlipidemia, unspecified: Secondary | ICD-10-CM

## 2017-12-30 MED ORDER — ATORVASTATIN CALCIUM 40 MG PO TABS: 40.0000 mg | ORAL_TABLET | Freq: Every day | ORAL | 0 refills | Status: DC

## 2017-12-30 NOTE — Telephone Encounter (Signed)
Copied from [place] [id number]. Topic: Quick Communication - Rx Refill/Question >> Dec 30, 2017 10:14 AM Waylan Rocher, Lumin L wrote: Medication: atorvastatin (LIPITOR) 40 MG tablet   Has the patient contacted their pharmacy? Yes.   (Agent: If no, request that the patient contact the pharmacy for the refill.) (Agent: If yes, when and what did the pharmacy advise?)  Preferred Pharmacy (with phone number or street name): [facility] [address], [place] - [address] [place] [address] Phone: [phone] Fax: [phone]  Agent: Please be advised that RX refills may take up to 3 business days. We ask that you follow-up with your pharmacy.

## 2017-12-30 NOTE — Telephone Encounter (Unsigned)
Copied from [place] [phone]. Topic: General - Other >> Dec 30, 2017 10:17 AM Marin Olp L wrote: Reason for CRM: Please have Sowles or her cma/rn call patient to discuss medication prescribed after recent ED visit, Needs to know if Sowles will refill them?

## 2018-01-08 DIAGNOSIS — M1611 Unilateral primary osteoarthritis, right hip: Secondary | ICD-10-CM | POA: Diagnosis not present

## 2018-01-15 DIAGNOSIS — M1611 Unilateral primary osteoarthritis, right hip: Secondary | ICD-10-CM | POA: Diagnosis not present

## 2018-02-05 ENCOUNTER — Encounter: Payer: Self-pay | Admitting: Family Medicine

## 2018-02-05 ENCOUNTER — Ambulatory Visit (INDEPENDENT_AMBULATORY_CARE_PROVIDER_SITE_OTHER): Payer: Medicare Other | Admitting: Family Medicine

## 2018-02-05 VITALS — BP 108/58 | HR 88 | Temp 98.0°F | Resp 16 | Ht 64.0 in | Wt 224.6 lb

## 2018-02-05 DIAGNOSIS — E1122 Type 2 diabetes mellitus with diabetic chronic kidney disease: Secondary | ICD-10-CM

## 2018-02-05 DIAGNOSIS — N183 Chronic kidney disease, stage 3 unspecified: Secondary | ICD-10-CM

## 2018-02-05 DIAGNOSIS — E785 Hyperlipidemia, unspecified: Secondary | ICD-10-CM

## 2018-02-05 DIAGNOSIS — E1169 Type 2 diabetes mellitus with other specified complication: Secondary | ICD-10-CM | POA: Diagnosis not present

## 2018-02-05 DIAGNOSIS — E669 Obesity, unspecified: Secondary | ICD-10-CM | POA: Diagnosis not present

## 2018-02-05 DIAGNOSIS — E559 Vitamin D deficiency, unspecified: Secondary | ICD-10-CM

## 2018-02-05 DIAGNOSIS — Z23 Encounter for immunization: Secondary | ICD-10-CM | POA: Diagnosis not present

## 2018-02-05 DIAGNOSIS — I10 Essential (primary) hypertension: Secondary | ICD-10-CM

## 2018-02-05 LAB — POCT GLYCOSYLATED HEMOGLOBIN (HGB A1C): HbA1c, POC (controlled diabetic range): 6.4 % (ref 0.0–7.0)

## 2018-02-05 MED ORDER — LOSARTAN POTASSIUM 100 MG PO TABS: 100.0000 mg | ORAL_TABLET | Freq: Every day | ORAL | 0 refills | Status: DC

## 2018-02-05 NOTE — Progress Notes (Signed)
Name: Tammy Lozano   MRN: [id number]    DOB: 14-Jan-1941   Date:02/05/2018       Progress Note  Subjective  Chief Complaint  Chief Complaint  Patient presents with  . Medication Refill  . Diabetes  . Hyperlipidemia  . Hypertension    Denies any symptoms  . Chronic Kidney Disease    HPI  Diabetes type II: diagnosed October 2018 with hgbA1C of 6.6%. She states recently seen by eye doctor and has mild cataracts. She denies polyphagia, polydipsia or polyuria. She is not taking aspirin and is 77 yo , therefore we will not start it now. She is obese, and discussed healthy diet, she also had multiple GFR's below 60, but last one improved. On ARB . Last eye exam done and we will get records.   Hyperlipidemia: she takes Atorvastatin, no myalgias, compliant with medication, she has been drinking grapefruit juice and advised her to stop   HTN: she takes hctz, states ace caused a cough, advised to stop taking potassium otc, no chest pain, dizziness or palpitation.  BP Is low today and because of history of hypokalemia, we will increase dose of ARB and stop hctz. Recheck bp in one week.   Morbid obesity: she states her weight was normal after her she had children , but gained weight in 1982 after she lost her mother in 6. She states not because of depression, just likes to eat. Reminded her of life style modification   CKI: sees Dr. Candiss Norse and is now on  ARB, reviewed labs with patient, check urine micro today , on vitamin D otc. Normal urine output and no pruritis.   Chronic hip pain, but right side had a severe flare, went to [facility] and followed up with ortho, she has OA but responded to prednisone and pain medication, she will return if she needs an injection.   Patient Active Problem List   Diagnosis Date Noted  . Morbid obesity ([place]) 11/04/2017  . Osteopenia of left hip 06/01/2017  . Controlled type 2 diabetes mellitus with chronic kidney disease ([place]) 11/28/2015  . CKD (chronic kidney  disease) stage 3, GFR 30-59 ml/min (HCC) 11/28/2015  . Hypertension 12/04/2014  . Hyperlipidemia 12/04/2014    Past Surgical History:  Procedure Laterality Date  . COLONOSCOPY W/ POLYPECTOMY  05/16/1994  . COLONOSCOPY W/ POLYPECTOMY  12/23/2011   adenomatous polyp  . COLONOSCOPY WITH PROPOFOL N/A 08/12/2017   Procedure: COLONOSCOPY WITH PROPOFOL;  Surgeon: Manya Silvas, MD;  Location: [facility] ENDOSCOPY;  Service: Endoscopy;  Laterality: N/A;  . KNEE SURGERY Left 2010   arthroscopic    Family History  Problem Relation Age of Onset  . Cancer Mother        lung  . Cancer Father        brain  . Aneurysm Sister   . Cancer Brother   . Cancer Brother        esophagus    Social History   Socioeconomic History  . Marital status: Divorced    Spouse name: Not on file  . Number of children: 3  . Years of education: 77  . Highest education level: 12th grade  Occupational History    Employer: RETIRED  Social Needs  . Financial resource strain: Not hard at all  . Food insecurity:    Worry: Never true    Inability: Never true  . Transportation needs:    Medical: No    Non-medical: No  Tobacco Use  .  Smoking status: Former Smoker    Packs/day: 2.50    Years: 40.00    Pack years: 100.00    Types: Cigarettes    Last attempt to quit: 2002    Years since quitting: 17.8  . Smokeless tobacco: Never Used  . Tobacco comment: quit in 2002. Has not resumed smoking since 2002. Pt does not require smoking cessation materials.  Substance and Sexual Activity  . Alcohol use: No    Alcohol/week: 0.0 standard drinks  . Drug use: No  . Sexual activity: Never  Lifestyle  . Physical activity:    Days per week: 0 days    Minutes per session: 0 min  . Stress: Not at all  Relationships  . Social connections:    Talks on phone: More than three times a week    Gets together: More than three times a week    Attends religious service: Never    Active member of club or organization: No     Attends meetings of clubs or organizations: Never    Relationship status: Divorced  . Intimate partner violence:    Fear of current or ex partner: No    Emotionally abused: No    Physically abused: No    Forced sexual activity: No  Other Topics Concern  . Not on file  Social History Narrative   Lives with grandson , his wife and 3 great-grandchildren      Current Outpatient Medications:  .  atorvastatin (LIPITOR) 40 MG tablet, Take 1 tablet (40 mg total) by mouth daily., Disp: 90 tablet, Rfl: 0 .  Cholecalciferol (VITAMIN D-3) 1000 units CAPS, Take 1 capsule (1,000 Units total) by mouth daily., Disp: 30 capsule, Rfl: 0 .  hydrochlorothiazide (HYDRODIURIL) 12.5 MG tablet, Take 1 tablet (12.5 mg total) by mouth daily., Disp: 90 tablet, Rfl: 1 .  HYDROcodone-acetaminophen (NORCO/VICODIN) 5-325 MG tablet, Take 1 tablet by mouth every 8 (eight) hours as needed for moderate pain., Disp: 12 tablet, Rfl: 0 .  losartan (COZAAR) 50 MG tablet, Take 1 tablet (50 mg total) by mouth daily., Disp: 90 tablet, Rfl: 1 .  Omega-3 Fatty Acids (FISH OIL) 1000 MG CAPS, Take 1,000 mg by mouth daily. , Disp: , Rfl:   Allergies  Allergen Reactions  . Ace Inhibitors     cough    I personally reviewed active problem list, medication list, allergies, family history, social history with the patient/caregiver today.   ROS  Constitutional: Negative for fever or weight change.  Respiratory: Negative for cough and shortness of breath.   Cardiovascular: Negative for chest pain or palpitations.  Gastrointestinal: Negative for abdominal pain, no bowel changes.  Musculoskeletal: Positive  for gait problem and  joint swelling - fingers - chronic .  Skin: Negative for rash.  Neurological: Negative for dizziness or headache.  No other specific complaints in a complete review of systems (except as listed in HPI above).  Objective  Vitals:   02/05/18 0903  BP: (!) 108/58  Pulse: 99  Resp: 16  Temp: 98 F  (36.7 C)  TempSrc: Oral  SpO2: 99%  Weight: 224 lb 9.6 oz (101.9 kg)  Height: 5\' 4"  (1.626 m)    Body mass index is 38.55 kg/m.  Physical Exam  Constitutional: Patient appears well-developed and well-nourished. Obese No distress.  HEENT: head atraumatic, normocephalic, pupils equal and reactive to light, neck supple, throat within normal limits Cardiovascular: Normal rate, regular rhythm and normal heart sounds.  No murmur heard. No BLE edema.  Pulmonary/Chest: Effort normal and breath sounds normal. No respiratory distress. Abdominal: Soft.  There is no tenderness. Psychiatric: Patient has a normal mood and affect. behavior is normal. Judgment and thought content normal. Muscular Skeletal: uses a walker, enlarged joints on both hands.  Recent Results (from the past 2160 hour(s))  POCT HgB A1C     Status: Normal   Collection Time: 02/05/18  9:16 AM  Result Value Ref Range   Hemoglobin A1C     HbA1c POC (<> result, manual entry)     HbA1c, POC (prediabetic range)     HbA1c, POC (controlled diabetic range) 6.4 0.0 - 7.0 %     PHQ2/9: Depression screen [facility] 2/9 02/05/2018 11/04/2017 04/20/2017 03/20/2017 12/15/2016  Decreased Interest 0 0 0 0 0  Down, Depressed, Hopeless 0 0 0 0 0  PHQ - 2 Score 0 0 0 0 0  Altered sleeping 0 - - - -  Tired, decreased energy 0 - - - -  Change in appetite 0 - - - -  Feeling bad or failure about yourself  0 - - - -  Trouble concentrating 0 - - - -  Moving slowly or fidgety/restless 0 - - - -  Suicidal thoughts 0 - - - -  PHQ-9 Score 0 - - - -  Difficult doing work/chores Not difficult at all - - - -    Fall Risk: Fall Risk  02/05/2018 11/04/2017 04/20/2017 03/20/2017 12/15/2016  Falls in the past year? 0 No No No No  Number falls in past yr: 0 - - - -  Injury with Fall? 0 - - - -  Risk for fall due to : - - - Other (Comment);Medication side effect -  Risk for fall due to: Comment - - - use of walker, takes diuretic -     Functional Status  Survey: Is the patient deaf or have difficulty hearing?: No Does the patient have difficulty seeing, even when wearing glasses/contacts?: Yes Does the patient have difficulty concentrating, remembering, or making decisions?: No Does the patient have difficulty walking or climbing stairs?: Yes Does the patient have difficulty dressing or bathing?: No Does the patient have difficulty doing errands alone such as visiting a doctor's office or shopping?: No    Assessment & Plan  1. Diabetes mellitus type 2 in obese (HCC)  - POCT HgB A1C - Urine Microalbumin w/creat. ratio  2. Need for immunization against influenza  Refused  3. Hyperlipidemia, unspecified hyperlipidemia type  Continue statin therapy  4. Morbid obesity ([place])  Discussed with the patient the risk posed by an increased BMI. Discussed importance of portion control, calorie counting and at least 150 minutes of physical activity weekly. Avoid sweet beverages and drink more water. Eat at least 6 servings of fruit and vegetables daily   5. Essential hypertension  - losartan (COZAAR) 100 MG tablet; Take 1 tablet (100 mg total) by mouth daily.  Dispense: 90 tablet; Refill: 0 Stop hctz because bp has been low and also low potassium   6. CKD (chronic kidney disease) stage 3, GFR 30-59 ml/min (HCC)  GFR was below 60 times 2, last level improved, we will recheck next visit, on ARB and doing well   7. Vitamin D deficiency  Continue otc supplementation

## 2018-02-06 LAB — MICROALBUMIN / CREATININE URINE RATIO
Creatinine, Urine: 201 mg/dL (ref 20–275)
Microalb Creat Ratio: 7 mcg/mg creat (ref <30)
Microalb, Ur: 1.4 mg/dL

## 2018-02-12 ENCOUNTER — Ambulatory Visit (INDEPENDENT_AMBULATORY_CARE_PROVIDER_SITE_OTHER): Payer: Medicare Other

## 2018-02-12 VITALS — BP 122/66 | HR 78 | Temp 98.6°F | Resp 16 | Ht 64.0 in | Wt 224.0 lb

## 2018-02-12 DIAGNOSIS — I10 Essential (primary) hypertension: Secondary | ICD-10-CM

## 2018-02-12 NOTE — Patient Instructions (Signed)
Patient is here for a blood pressure check. Patient denies chest pain, palpitations, shortness of breath or visual disturbances. At previous visit blood pressure was 108/58 with a heart rate of 88. Today during nurse visit first check blood pressure was 122/66 with a heart rate of 78. She has been taking all medications as directed.  Patient was told to f/u as needed.  Dr. Ancil Boozer was informed.

## 2018-02-22 ENCOUNTER — Encounter: Payer: Self-pay | Admitting: Family Medicine

## 2018-02-23 ENCOUNTER — Encounter: Payer: Self-pay | Admitting: Family Medicine

## 2018-03-01 ENCOUNTER — Ambulatory Visit: Payer: Medicare Other | Admitting: Podiatry

## 2018-03-23 ENCOUNTER — Ambulatory Visit (INDEPENDENT_AMBULATORY_CARE_PROVIDER_SITE_OTHER): Payer: Medicare Other

## 2018-03-23 VITALS — BP 112/70 | HR 104 | Temp 97.9°F | Resp 15 | Ht 64.0 in | Wt 221.8 lb

## 2018-03-23 DIAGNOSIS — Z0000 Encounter for general adult medical examination without abnormal findings: Secondary | ICD-10-CM

## 2018-03-23 NOTE — Patient Instructions (Signed)
Tammy Lozano , Thank you for taking time to come for your Medicare Wellness Visit. I appreciate your ongoing commitment to your health goals. Please review the following plan we discussed and let me know if I can assist you in the future.   Screening recommendations/referrals: Colonoscopy: done 08/12/17 repeat in 5 years Mammogram: postponed Bone Density: postponed Recommended yearly ophthalmology/optometry visit for glaucoma screening and checkup Recommended yearly dental visit for hygiene and checkup  Vaccinations: Influenza vaccine: declined Pneumococcal vaccine: done 11/04/17 Tdap vaccine: due - please contact us if you get a cut or scrape Shingles vaccine: Shingrix discussed. Please contact your pharmacy for coverage information.     Advanced directives: Advance directive discussed with you today. I have provided a copy for you to complete at home and have notarized. Once this is complete please bring a copy in to our office so we can scan it into your chart.  Conditions/risks identified: Recommend continue healthy habits with water intake and fresh fruits and vegetables.   Next appointment: 06/07/2018 9:00 Dr. Ancil Boozer   Preventive Care 45 Years and Older, Female Preventive care refers to lifestyle choices and visits with your health care provider that can promote health and wellness. What does preventive care include?  A yearly physical exam. This is also called an annual well check.  Dental exams once or twice a year.  Routine eye exams. Ask your health care provider how often you should have your eyes checked.  Personal lifestyle choices, including:  Daily care of your teeth and gums.  Regular physical activity.  Eating a healthy diet.  Avoiding tobacco and drug use.  Limiting alcohol use.  Practicing safe sex.  Taking low-dose aspirin every day.  Taking vitamin and mineral supplements as recommended by your health care provider. What happens during an annual well  check? The services and screenings done by your health care provider during your annual well check will depend on your age, overall health, lifestyle risk factors, and family history of disease. Counseling  Your health care provider may ask you questions about your:  Alcohol use.  Tobacco use.  Drug use.  Emotional well-being.  Home and relationship well-being.  Sexual activity.  Eating habits.  History of falls.  Memory and ability to understand (cognition).  Work and work Statistician.  Reproductive health. Screening  You may have the following tests or measurements:  Height, weight, and BMI.  Blood pressure.  Lipid and cholesterol levels. These may be checked every 5 years, or more frequently if you are over 14 years old.  Skin check.  Lung cancer screening. You may have this screening every year starting at age 53 if you have a 30-pack-year history of smoking and currently smoke or have quit within the past 15 years.  Fecal occult blood test (FOBT) of the stool. You may have this test every year starting at age 51.  Flexible sigmoidoscopy or colonoscopy. You may have a sigmoidoscopy every 5 years or a colonoscopy every 10 years starting at age 22.  Hepatitis C blood test.  Hepatitis B blood test.  Sexually transmitted disease (STD) testing.  Diabetes screening. This is done by checking your blood sugar (glucose) after you have not eaten for a while (fasting). You may have this done every 1-3 years.  Bone density scan. This is done to screen for osteoporosis. You may have this done starting at age 64.  Mammogram. This may be done every 1-2 years. Talk to your health care provider about how often  you should have regular mammograms. Talk with your health care provider about your test results, treatment options, and if necessary, the need for more tests. Vaccines  Your health care provider may recommend certain vaccines, such as:  Influenza vaccine. This is  recommended every year.  Tetanus, diphtheria, and acellular pertussis (Tdap, Td) vaccine. You may need a Td booster every 10 years.  Zoster vaccine. You may need this after age 61.  Pneumococcal 13-valent conjugate (PCV13) vaccine. One dose is recommended after age 62.  Pneumococcal polysaccharide (PPSV23) vaccine. One dose is recommended after age 23. Talk to your health care provider about which screenings and vaccines you need and how often you need them. This information is not intended to replace advice given to you by your health care provider. Make sure you discuss any questions you have with your health care provider. Document Released: 04/20/2015 Document Revised: 12/12/2015 Document Reviewed: 01/23/2015 Elsevier Interactive Patient Education  2017 [place] Prevention in the Home Falls can cause injuries. They can happen to people of all ages. There are many things you can do to make your home safe and to help prevent falls. What can I do on the outside of my home?  Regularly fix the edges of walkways and driveways and fix any cracks.  Remove anything that might make you trip as you walk through a door, such as a raised step or threshold.  Trim any bushes or trees on the path to your home.  Use bright outdoor lighting.  Clear any walking paths of anything that might make someone trip, such as rocks or tools.  Regularly check to see if handrails are loose or broken. Make sure that both sides of any steps have handrails.  Any raised decks and porches should have guardrails on the edges.  Have any leaves, snow, or ice cleared regularly.  Use sand or salt on walking paths during winter.  Clean up any spills in your garage right away. This includes oil or grease spills. What can I do in the bathroom?  Use night lights.  Install grab bars by the toilet and in the tub and shower. Do not use towel bars as grab bars.  Use non-skid mats or decals in the tub or  shower.  If you need to sit down in the shower, use a plastic, non-slip stool.  Keep the floor dry. Clean up any water that spills on the floor as soon as it happens.  Remove soap buildup in the tub or shower regularly.  Attach bath mats securely with double-sided non-slip rug tape.  Do not have throw rugs and other things on the floor that can make you trip. What can I do in the bedroom?  Use night lights.  Make sure that you have a light by your bed that is easy to reach.  Do not use any sheets or blankets that are too big for your bed. They should not hang down onto the floor.  Have a firm chair that has side arms. You can use this for support while you get dressed.  Do not have throw rugs and other things on the floor that can make you trip. What can I do in the kitchen?  Clean up any spills right away.  Avoid walking on wet floors.  Keep items that you use a lot in easy-to-reach places.  If you need to reach something above you, use a strong step stool that has a grab bar.  Keep electrical cords  out of the way.  Do not use floor polish or wax that makes floors slippery. If you must use wax, use non-skid floor wax.  Do not have throw rugs and other things on the floor that can make you trip. What can I do with my stairs?  Do not leave any items on the stairs.  Make sure that there are handrails on both sides of the stairs and use them. Fix handrails that are broken or loose. Make sure that handrails are as long as the stairways.  Check any carpeting to make sure that it is firmly attached to the stairs. Fix any carpet that is loose or worn.  Avoid having throw rugs at the top or bottom of the stairs. If you do have throw rugs, attach them to the floor with carpet tape.  Make sure that you have a light switch at the top of the stairs and the bottom of the stairs. If you do not have them, ask someone to add them for you. What else can I do to help prevent  falls?  Wear shoes that:  Do not have high heels.  Have rubber bottoms.  Are comfortable and fit you well.  Are closed at the toe. Do not wear sandals.  If you use a stepladder:  Make sure that it is fully opened. Do not climb a closed stepladder.  Make sure that both sides of the stepladder are locked into place.  Ask someone to hold it for you, if possible.  Clearly mark and make sure that you can see:  Any grab bars or handrails.  First and last steps.  Where the edge of each step is.  Use tools that help you move around (mobility aids) if they are needed. These include:  Canes.  Walkers.  Scooters.  Crutches.  Turn on the lights when you go into a dark area. Replace any light bulbs as soon as they burn out.  Set up your furniture so you have a clear path. Avoid moving your furniture around.  If any of your floors are uneven, fix them.  If there are any pets around you, be aware of where they are.  Review your medicines with your doctor. Some medicines can make you feel dizzy. This can increase your chance of falling. Ask your doctor what other things that you can do to help prevent falls. This information is not intended to replace advice given to you by your health care provider. Make sure you discuss any questions you have with your health care provider. Document Released: 01/18/2009 Document Revised: 08/30/2015 Document Reviewed: 04/28/2014 Elsevier Interactive Patient Education  2017 [employer].

## 2018-03-23 NOTE — Progress Notes (Signed)
Subjective:   Tammy Lozano is a 77 y.o. female who presents for Medicare Annual (Subsequent) preventive examination.  Review of Systems:   Cardiac Risk Factors include: advanced age (>59men, >6 women);dyslipidemia;hypertension;obesity (BMI >30kg/m2)     Objective:     Vitals: BP 112/70 (BP Location: Right Arm, Patient Position: Sitting, Cuff Size: Large)   Pulse (!) 104   Temp 97.9 F (36.6 C) (Oral)   Resp 15   Ht 5\' 4"  (1.626 m)   Wt 221 lb 12.8 oz (100.6 kg)   SpO2 93%   BMI 38.07 kg/m   Body mass index is 38.07 kg/m.  Advanced Directives 03/23/2018 12/26/2017 08/12/2017 03/20/2017 12/15/2016 06/13/2016 03/17/2016  Does Patient Have a Medical Advance Directive? No No No Yes No No No  Type of Advance Directive - - Public librarian;Living will - - -  Copy of [place] in Chart? - - - No - copy requested - - -  Would patient like information on creating a medical advance directive? Yes (MAU/Ambulatory/Procedural Areas - Information given) - Yes (MAU/Ambulatory/Procedural Areas - Information given) - - - No - Patient declined    Tobacco Social History   Tobacco Use  Smoking Status Former Smoker  . Packs/day: 2.50  . Years: 40.00  . Pack years: 100.00  . Types: Cigarettes  . Last attempt to quit: 2002  . Years since quitting: 17.9  Smokeless Tobacco Never Used  Tobacco Comment   quit in 2002. Has not resumed smoking since 2002. Pt does not require smoking cessation materials.     Counseling given: Not Answered Comment: quit in 2002. Has not resumed smoking since 2002. Pt does not require smoking cessation materials.   Clinical Intake:  Pre-visit preparation completed: Yes  Pain : 0-10 Pain Score: 4  Pain Type: Chronic pain Pain Location: Hip(left hip and knee) Pain Orientation: Left Pain Descriptors / Indicators: Aching(arthritis) Pain Onset: More than a month ago Pain Frequency: Constant     Nutritional Status: BMI > 30   Obese Nutritional Risks: None Diabetes: Yes CBG done?: No Did pt. bring in CBG monitor from home?: No   Nutrition Risk Assessment:  Has the patient had any N/V/D within the last 2 months?  No  Does the patient have any non-healing wounds?  No  Has the patient had any unintentional weight loss or weight gain?  No   Diabetes:  Is the patient diabetic?  Yes  If diabetic, was a CBG obtained today?  No  Did the patient bring in their glucometer from home?  No  How often do you monitor your CBG's? Pt does not check blood sugar regularly.   Financial Strains and Diabetes Management:  Are you having any financial strains with the device, your supplies or your medication? No .  Does the patient want to be seen by Chronic Care Management for management of their diabetes?  No  Would the patient like to be referred to a Nutritionist or for Diabetic Management?  No   Diabetic Exams:  Diabetic Eye Exam: Completed 05/01/17 negative retinopathy.   Diabetic Foot Exam: Completed 11/04/17.   How often do you need to have someone help you when you read instructions, pamphlets, or other written materials from your doctor or pharmacy?: 1 - Never What is the last grade level you completed in school?: 12th grade  Interpreter Needed?: No  Information entered by :: Clemetine Marker LPN  Past Medical History:  Diagnosis Date  .  Chronic kidney disease    stage 3  . Hyperlipidemia   . Hypertension   . Osteoarthritis    Past Surgical History:  Procedure Laterality Date  . COLONOSCOPY W/ POLYPECTOMY  05/16/1994  . COLONOSCOPY W/ POLYPECTOMY  12/23/2011   adenomatous polyp  . COLONOSCOPY WITH PROPOFOL N/A 08/12/2017   Procedure: COLONOSCOPY WITH PROPOFOL;  Surgeon: Manya Silvas, MD;  [facility] END[facility];  Service: Endoscopy;  Laterality: N/A;  . KNEE SURGERY Left 2010   arthroscopic   Family History  Problem Relation Age of Onset  . Cancer Mother        lung  . Cancer Father         brain  . Aneurysm Sister   . Cancer Brother        pancreatic  . Cancer Brother        esophagus  . Colon cancer Paternal Grandfather    Social History   Socioeconomic History  . Marital status: Divorced    Spouse name: Not on file  . Number of children: 3  . Years of education: 74  . Highest education level: 12th grade  Occupational History    Employer: RETIRED  Social Needs  . Financial resource strain: Not hard at all  . Food insecurity:    Worry: Never true    Inability: Never true  . Transportation needs:    Medical: No    Non-medical: No  Tobacco Use  . Smoking status: Former Smoker    Packs/day: 2.50    Years: 40.00    Pack years: 100.00    Types: Cigarettes    Last attempt to quit: 2002    Years since quitting: 17.9  . Smokeless tobacco: Never Used  . Tobacco comment: quit in 2002. Has not resumed smoking since 2002. Pt does not require smoking cessation materials.  Substance and Sexual Activity  . Alcohol use: No    Alcohol/week: 0.0 standard drinks  . Drug use: No  . Sexual activity: Not Currently  Lifestyle  . Physical activity:    Days per week: 0 days    Minutes per session: 0 min  . Stress: Not at all  Relationships  . Social connections:    Talks on phone: More than three times a week    Gets together: More than three times a week    Attends religious service: Never    Active member of club or organization: No    Attends meetings of clubs or organizations: Never    Relationship status: Divorced  Other Topics Concern  . Not on file  Social History Narrative   Lives with grandson , his wife and 3 great-grandchildren     Outpatient Encounter Medications as of 03/23/2018  Medication Sig  . atorvastatin (LIPITOR) 40 MG tablet Take 1 tablet (40 mg total) by mouth daily.  . Cholecalciferol (VITAMIN D-3) 1000 units CAPS Take 1 capsule (1,000 Units total) by mouth daily.  [name] HYDROcodone-acetaminophen (NORCO/VICODIN) 5-325 MG tablet Take 1 tablet by  mouth every 8 (eight) hours as needed for moderate pain.  [name] losartan (COZAAR) 100 MG tablet Take 1 tablet (100 mg total) by mouth daily.  . Omega-3 Fatty Acids (FISH OIL) 1000 MG CAPS Take 1,000 mg by mouth daily.    No facility-administered encounter medications on file as of 03/23/2018.     Activities of Daily Living In your present state of health, do you have any difficulty performing the following activities: 03/23/2018 02/05/2018  Hearing? N N  Comment declines hearing aids -  Vision? N Y  Comment wears glasses -  Difficulty concentrating or making decisions? N N  Walking or climbing stairs? N Y  Comment - -  Dressing or bathing? N N  Doing errands, shopping? N N  Preparing Food and eating ? N -  Using the Toilet? N -  In the past six months, have you accidently leaked urine? N -  Do you have problems with loss of bowel control? N -  Managing your Medications? N -  Managing your Finances? N -  Housekeeping or managing your Housekeeping? N -  Some recent data might be hidden    Patient Care Team: Steele Sizer, MD as PCP - General (Family Medicine) Murlean Iba, MD as Consulting Physician (Nephrology)    Assessment:   This is a routine wellness examination for Shary.  Exercise Activities and Dietary recommendations Current Exercise Habits: Home exercise routine(pt active at home), Exercise limited by: orthopedic condition(s)  Goals    . Cut out extra servings     Continue to eliminate large meals and snacks prior to bedtime    . diet     Continue to eliminate large meals and snacks prior to bedtime     . Increase water intake     Starting 03/17/16, I will increase my water intake to 4 glasses a day.       Fall Risk Fall Risk  03/23/2018 02/05/2018 11/04/2017 04/20/2017 03/20/2017  Falls in the past year? 0 0 No No No  Number falls in past yr: 0 0 - - -  Injury with Fall? - 0 - - -  Risk for fall due to : - - - - Other (Comment);Medication side effect    Risk for fall due to: Comment - - - - use of walker, takes diuretic   FALL RISK PREVENTION PERTAINING TO THE HOME:  Any stairs in or around the home WITH handrails? Yes  Home free of loose throw rugs in walkways, pet beds, electrical cords, etc? Yes  Adequate lighting in your home to reduce risk of falls? Yes   ASSISTIVE DEVICES UTILIZED TO PREVENT FALLS:  Life alert? No  Use of a cane, walker or w/c? Yes  Grab bars in the bathroom? No  Shower chair or bench in shower? Yes  Elevated toilet seat or a handicapped toilet? No   DME ORDERS:  DME order needed?  No   TIMED UP AND GO:  Was the test performed? Yes .  Length of time to ambulate 10 feet: 7 sec.   GAIT:  Appearance of gait: Gait stead-fast and without the use of an assistive device.   Education: Fall risk prevention has been discussed.  Intervention(s) required? No   Depression Screen PHQ 2/9 Scores 03/23/2018 02/05/2018 11/04/2017 04/20/2017  PHQ - 2 Score 0 0 0 0  PHQ- 9 Score 0 0 - -     Cognitive Function     6CIT Screen 03/23/2018 03/20/2017 03/17/2016  What Year? 0 points 0 points 0 points  What month? 0 points 0 points 0 points  What time? 0 points 0 points 0 points  Count back from 20 0 points 0 points 0 points  Months in reverse 0 points 0 points 0 points  Repeat phrase 0 points 2 points 0 points  Total Score 0 2 0    Immunization History  Administered Date(s) Administered  . Pneumococcal Polysaccharide-23 11/04/2017    Qualifies for Shingles Vaccine? Yes . Due  for Shingrix. Education has been provided regarding the importance of this vaccine. Pt has been advised to call insurance company to determine out of pocket expense. Advised may also receive vaccine at local pharmacy or Health Dept. Verbalized acceptance and understanding.  Tdap: Although this vaccine is not a covered service during a Wellness Exam, does the patient still wish to receive this vaccine today?  No .  Education has been  provided regarding the importance of this vaccine. Advised may receive this vaccine at local pharmacy or Health Dept. Aware to provide a copy of the vaccination record if obtained from local pharmacy or Health Dept. Verbalized acceptance and understanding. [facility] provides documentation of tdap 01/24/14 but pt does not recall.   Flu Vaccine: Due for Flu vaccine. Does the patient want to receive this vaccine today?  No . Education has been provided regarding the importance of this vaccine but still declined. Advised may receive this vaccine at local pharmacy or Health Dept. Aware to provide a copy of the vaccination record if obtained from local pharmacy or Health Dept. Verbalized acceptance and understanding.  Pneumococcal Vaccine: PPSV23 done 11/04/17 due for PCV13 next year.   Screening Tests Health Maintenance  Topic Date Due  . MAMMOGRAM  03/08/2019 (Originally 05/09/1958)  . DEXA SCAN  03/24/2019 (Originally 05/09/2004)  . INFLUENZA VACCINE  03/29/2019 (Originally 11/05/2017)  . OPHTHALMOLOGY EXAM  05/01/2018  . HEMOGLOBIN A1C  08/06/2018  . FOOT EXAM  11/05/2018  . PNA vac Low Risk Adult (2 of 2 - PCV13) 11/05/2018  . COLONOSCOPY  08/13/2022  . TETANUS/TDAP  01/25/2024    Cancer Screenings:  Colorectal Screening: Completed 08/12/17. Repeat every 5 years;  Mammogram: Completed 11/17/12. Pt declines further mammograms.  Bone Density: Completed 2009. Pt declines further dexa scans.   Lung Cancer Screening: (Low Dose CT Chest recommended if Age 79-80 years, 30 pack-year currently smoking OR have quit w/in 15years.) does not qualify.    Additional Screening:  Hepatitis C Screening:no longer required  Vision Screening: Recommended annual ophthalmology exams for early detection of glaucoma and other disorders of the eye. Is the patient up to date with their annual eye exam?  Yes  Who is the provider or what is the name of the office in which the pt attends annual eye exams? [place] Screening: Recommended annual dental exams for proper oral hygiene  Community Resource Referral:  CRR required this visit?  No '     Plan:    I have personally reviewed and addressed the Medicare Annual Wellness questionnaire and have noted the following in the patient's chart:  A. Medical and social history B. Use of alcohol, tobacco or illicit drugs  C. Current medications and supplements D. Functional ability and status E.  Nutritional status F.  Physical activity G. Advance directives H. List of other physicians I.  Hospitalizations, surgeries, and ER visits in previous 12 months J.  [place] such as hearing and vision if needed, cognitive and depression L. Referrals and appointments   In addition, I have reviewed and discussed with patient certain preventive protocols, quality metrics, and best practice recommendations. A written personalized care plan for preventive services as well as general preventive health recommendations were provided to patient.   Signed,  Clemetine Marker, LPN Nurse Health Advisor   Nurse Notes:pt doing well and appreciative of visit today

## 2018-03-29 ENCOUNTER — Encounter: Payer: Self-pay | Admitting: Emergency Medicine

## 2018-03-29 ENCOUNTER — Other Ambulatory Visit: Payer: Self-pay

## 2018-03-29 ENCOUNTER — Other Ambulatory Visit: Payer: Self-pay | Admitting: Family Medicine

## 2018-03-29 ENCOUNTER — Emergency Department: Payer: Medicare Other

## 2018-03-29 ENCOUNTER — Emergency Department
Admission: EM | Admit: 2018-03-29 | Discharge: 2018-03-29 | Disposition: A | Discharge status: Home / Self Care | Payer: Medicare Other | Attending: Emergency Medicine | Admitting: Emergency Medicine

## 2018-03-29 DIAGNOSIS — M19031 Primary osteoarthritis, right wrist: Secondary | ICD-10-CM | POA: Diagnosis not present

## 2018-03-29 DIAGNOSIS — E1122 Type 2 diabetes mellitus with diabetic chronic kidney disease: Secondary | ICD-10-CM | POA: Insufficient documentation

## 2018-03-29 DIAGNOSIS — Z87891 Personal history of nicotine dependence: Secondary | ICD-10-CM | POA: Diagnosis not present

## 2018-03-29 DIAGNOSIS — M25532 Pain in left wrist: Secondary | ICD-10-CM | POA: Diagnosis not present

## 2018-03-29 DIAGNOSIS — I129 Hypertensive chronic kidney disease with stage 1 through stage 4 chronic kidney disease, or unspecified chronic kidney disease: Secondary | ICD-10-CM | POA: Diagnosis not present

## 2018-03-29 DIAGNOSIS — M25531 Pain in right wrist: Secondary | ICD-10-CM | POA: Insufficient documentation

## 2018-03-29 DIAGNOSIS — Z79899 Other long term (current) drug therapy: Secondary | ICD-10-CM | POA: Insufficient documentation

## 2018-03-29 DIAGNOSIS — N183 Chronic kidney disease, stage 3 (moderate): Secondary | ICD-10-CM | POA: Insufficient documentation

## 2018-03-29 DIAGNOSIS — E785 Hyperlipidemia, unspecified: Secondary | ICD-10-CM

## 2018-03-29 LAB — SEDIMENTATION RATE: Sed Rate: 58 mm/hr — ABNORMAL HIGH (ref 0–30)

## 2018-03-29 LAB — URIC ACID: Uric Acid, Serum: 4.8 mg/dL (ref 2.5–7.1)

## 2018-03-29 MED ORDER — OXYCODONE-ACETAMINOPHEN 5-325 MG PO TABS
1.0000 | ORAL_TABLET | Freq: Once | ORAL | Status: AC
  Administered 2018-03-29: 1 via ORAL
  Filled 2018-03-29: qty 1

## 2018-03-29 MED ORDER — LIDOCAINE 5 % EX PTCH: 1.0000 | MEDICATED_PATCH | Freq: Two times a day (BID) | CUTANEOUS | 0 refills | Status: DC

## 2018-03-29 MED ORDER — LIDOCAINE 5 % EX PTCH
1.0000 | MEDICATED_PATCH | CUTANEOUS | Status: DC
  Administered 2018-03-29: 1 via TRANSDERMAL
  Filled 2018-03-29: qty 1

## 2018-03-29 MED ORDER — ONDANSETRON 8 MG PO TBDP
8.0000 mg | ORAL_TABLET | Freq: Once | ORAL | Status: AC
  Administered 2018-03-29: 8 mg via ORAL
  Filled 2018-03-29: qty 1

## 2018-03-29 MED ORDER — ATORVASTATIN CALCIUM 40 MG PO TABS: 40.0000 mg | ORAL_TABLET | Freq: Every day | ORAL | 1 refills | Status: DC

## 2018-03-29 MED ORDER — METHYLPREDNISOLONE 4 MG PO TBPK: ORAL_TABLET | ORAL | 0 refills | Status: DC

## 2018-03-29 NOTE — ED Notes (Signed)
See triage note  Presents with right hand pain  States pain started yesterday morning   Denies any injury  Pain radiates from wrist into right hand  Good pulses

## 2018-03-29 NOTE — Telephone Encounter (Signed)
Refill Request for Cholesterol medication. Liptor 40 mg  Last physical: 02/05/2018  Lab Results  Component Value Date   CHOL 165 11/04/2017   HDL 47 (L) 11/04/2017   LDLCALC 97 11/04/2017   TRIG 110 11/04/2017   CHOLHDL 3.5 11/04/2017    Follow up: 06/07/2018

## 2018-03-29 NOTE — Telephone Encounter (Signed)
Copied from [place] [id number]. Topic: Quick Communication - Rx Refill/Question >> Mar 29, 2018  9:29 AM Blase Mess A wrote: Medication: atorvastatin (LIPITOR) 40 MG tablet [[id number]]   Has the patient contacted their pharmacy? Yes  (Agent: If no, request that the patient contact the pharmacy for the refill.) (Agent: If yes, when and what did the pharmacy advise?)  Preferred Pharmacy (with phone number or street name): [facility] [address], [place] - [place] [phone] (Phone) [phone] (Fax)    Agent: Please be advised that RX refills may take up to 3 business days. We ask that you follow-up with your pharmacy.

## 2018-03-29 NOTE — ED Provider Notes (Signed)
[facility] Emergency Department Provider Note   ____________________________________________   First MD Initiated Contact with Patient 03/29/18 1218     (approximate)  I have reviewed the triage vital signs and the nursing notes.   HISTORY  Chief Complaint Hand Pain    HPI REGINIA BATTIE is a 77 y.o. female patient presents with right wrist pain and swelling that began yesterday with a.m. awakening.  Patient denies provoking incident for complaint.  Patient has a history of arthritis arthritis.  Patient denies loss of sensation but state pain increased with flexion extension of the wrist.  Patient rates pain as a 10/10.  Patient described pain is "achy".  No palliative measures prior to arrival.  Past Medical History:  Diagnosis Date  . Chronic kidney disease    stage 3  . Hyperlipidemia   . Hypertension   . Osteoarthritis     Patient Active Problem List   Diagnosis Date Noted  . Morbid obesity ([place]) 11/04/2017  . Osteopenia of left hip 06/01/2017  . Controlled type 2 diabetes mellitus with chronic kidney disease ([place]) 11/28/2015  . CKD (chronic kidney disease) stage 3, GFR 30-59 ml/min (HCC) 11/28/2015  . Hypertension 12/04/2014  . Hyperlipidemia 12/04/2014    Past Surgical History:  Procedure Laterality Date  . COLONOSCOPY W/ POLYPECTOMY  05/16/1994  . COLONOSCOPY W/ POLYPECTOMY  12/23/2011   adenomatous polyp  . COLONOSCOPY WITH PROPOFOL N/A 08/12/2017   Procedure: COLONOSCOPY WITH PROPOFOL;  Surgeon: Manya Silvas, MD;  Location: [facility] ENDOSCOPY;  Service: Endoscopy;  Laterality: N/A;  . KNEE SURGERY Left 2010   arthroscopic    Prior to Admission medications   Medication Sig Start Date End Date Taking? Authorizing Provider  atorvastatin (LIPITOR) 40 MG tablet Take 1 tablet (40 mg total) by mouth daily. 03/29/18   Steele Sizer, MD  Cholecalciferol (VITAMIN D-3) 1000 units CAPS Take 1 capsule (1,000 Units total) by mouth daily.  11/04/17   Steele Sizer, MD  HYDROcodone-acetaminophen (NORCO/VICODIN) 5-325 MG tablet Take 1 tablet by mouth every 8 (eight) hours as needed for moderate pain. 12/26/17   Johnn Hai, PA-C  lidocaine (LIDODERM) 5 % Place 1 patch onto the skin every 12 (twelve) hours. Remove & Discard patch within 12 hours or as directed by MD 03/29/18 03/29/19  Sable Feil, PA-C  losartan (COZAAR) 100 MG tablet Take 1 tablet (100 mg total) by mouth daily. 02/05/18   Steele Sizer, MD  methylPREDNISolone (MEDROL DOSEPAK) 4 MG TBPK tablet Take Tapered dose as directed 03/29/18   Sable Feil, PA-C  Omega-3 Fatty Acids (FISH OIL) 1000 MG CAPS Take 1,000 mg by mouth daily.     [provider]    Allergies Ace inhibitors  Family History  Problem Relation Age of Onset  . Cancer Mother        lung  . Cancer Father        brain  . Aneurysm Sister   . Cancer Brother        pancreatic  . Cancer Brother        esophagus  . Colon cancer Paternal Grandfather     Social History Social History   Tobacco Use  . Smoking status: Former Smoker    Packs/day: 2.50    Years: 40.00    Pack years: 100.00    Types: Cigarettes    Last attempt to quit: 2002    Years since quitting: 17.9  . Smokeless tobacco: Never Used  .  Tobacco comment: quit in 2002. Has not resumed smoking since 2002. Pt does not require smoking cessation materials.  Substance Use Topics  . Alcohol use: No    Alcohol/week: 0.0 standard drinks  . Drug use: No    Review of Systems  Constitutional: No fever/chills Eyes: No visual changes. ENT: No sore throat. Cardiovascular: Denies chest pain. Respiratory: Denies shortness of breath. Gastrointestinal: No abdominal pain.  No nausea, no vomiting.  No diarrhea.  No constipation. Genitourinary: Negative for dysuria. Musculoskeletal: Negative for back pain. Skin: Negative for rash. Neurological: Negative for headaches, focal weakness or  numbness. Endocrine:Hyperlipidemia, hypertension, and arthritis. Allergic/Immunilogical: ACE inhibitors.  ____________________________________________   PHYSICAL EXAM:  VITAL SIGNS: ED Triage Vitals  Enc Vitals Group     BP 03/29/18 1122 (!) 142/61     Pulse Rate 03/29/18 1122 78     Resp --      Temp 03/29/18 1122 98.9 F (37.2 C)     Temp Source 03/29/18 1122 Oral     SpO2 03/29/18 1122 94 %     Weight 03/29/18 1124 221 lb 12.5 oz (100.6 kg)     Height 03/29/18 1124 5\' 4"  (1.626 m)     Head Circumference --      Peak Flow --      Pain Score 03/29/18 1123 10     Pain Loc --      Pain Edu? --      Excl. in [place]? --     Constitutional: Alert and oriented. Well appearing and in no acute distress. Cardiovascular: Normal rate, regular rhythm. Grossly normal heart sounds.  Good peripheral circulation. Respiratory: Normal respiratory effort.  No retractions. Lungs CTAB. Musculoskeletal: No obvious deformity to the right wrist.  Arthritic changes noticed in the fingers of the right hand hand.  Patient pain increases with compression of the distal ulna radial joints. Neurologic:  Normal speech and language. No gross focal neurologic deficits are appreciated. No gait instability. Skin:  Skin is warm, dry and intact. No rash noted. Psychiatric: Mood and affect are normal. Speech and behavior are normal.  ____________________________________________   LABS (all labs ordered are listed, but only abnormal results are displayed)  Labs Reviewed  SEDIMENTATION RATE - Abnormal; Notable for the following components:      Result Value   Sed Rate 58 (*)    All other components within normal limits  URIC ACID   ____________________________________________  EKG   ____________________________________________  RADIOLOGY  ED MD interpretation:    Official radiology report(s): Dg Wrist Complete Right  Result Date: 03/29/2018 CLINICAL DATA:  Initial evaluation for acute right  wrist pain and swelling. No known injury. EXAM: RIGHT WRIST - COMPLETE 3+ VIEW COMPARISON:  None. FINDINGS: No acute fracture dislocation. Normal radiocarpal and distal radioulnar articulations maintained. Scattered osteoarthritic changes noted about the wrist with chondrocalcinosis in the region of the try fibrocartilage complex. Prominent osteoarthritic changes noted at the first [facility] joint and thumb. Osseous mineralization normal. No appreciable soft tissue abnormality. IMPRESSION: 1. No acute osseous abnormality about the right wrist. 2. 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 Electronically Signed   By: Jeannine Boga M.D.   On: 03/29/2018 13:42    ____________________________________________   PROCEDURES  Procedure(s) performed:   Procedures  Critical Care performed:   ____________________________________________   INITIAL IMPRESSION / ASSESSMENT AND PLAN / ED COURSE  As part of my medical decision making, I reviewed the following data within the [place]  Right wrist pain secondary arthritis.  Discussed x-ray findings and lab results with patient.  Patient given discharge care instruction and prescription for the Medrol Dosepak with Lidoderm patches.  Patient advised follow-up PCP for continued care.      ____________________________________________   FINAL CLINICAL IMPRESSION(S) / ED DIAGNOSES  Final diagnoses:  Left wrist pain  Primary osteoarthritis of right wrist     ED Discharge Orders         Ordered    lidocaine (LIDODERM) 5 %  Every 12 hours     03/29/18 1358    methylPREDNISolone (MEDROL DOSEPAK) 4 MG TBPK tablet     03/29/18 1358           Note:  This document was prepared using Dragon voice recognition software and may include unintentional dictation errors.    Sable Feil, PA-C 03/29/18 Kincaid, Randall An, MD 03/29/18 [phone]

## 2018-03-29 NOTE — ED Triage Notes (Signed)
Pt with right wrist pain and swelling that started on awaking yesterday. Pt denies injury.

## 2018-05-06 ENCOUNTER — Other Ambulatory Visit: Payer: Self-pay | Admitting: Family Medicine

## 2018-05-06 DIAGNOSIS — I10 Essential (primary) hypertension: Secondary | ICD-10-CM

## 2018-05-06 NOTE — Telephone Encounter (Signed)
Refill request for Hypertension medication:  Losartan 100 mg  Last office visit pertaining to hypertension: 02/05/2018  BP Readings from Last 3 Encounters:  03/29/18 133/72  03/23/18 112/70  02/12/18 122/66    Lab Results  Component Value Date   CREATININE 1.03 (H) 11/04/2017   BUN 15 11/04/2017   NA 141 11/04/2017   K 3.4 (L) 11/04/2017   CL 100 11/04/2017   CO2 31 11/04/2017   Follow-ups on file. 06/07/2018

## 2018-06-07 ENCOUNTER — Ambulatory Visit: Payer: Medicare Other | Admitting: Family Medicine

## 2018-07-26 ENCOUNTER — Other Ambulatory Visit: Payer: Self-pay

## 2018-07-26 ENCOUNTER — Ambulatory Visit (INDEPENDENT_AMBULATORY_CARE_PROVIDER_SITE_OTHER): Payer: Medicare Other | Admitting: Family Medicine

## 2018-07-26 ENCOUNTER — Encounter: Payer: Self-pay | Admitting: Family Medicine

## 2018-07-26 VITALS — BP 120/74 | Ht 64.0 in

## 2018-07-26 DIAGNOSIS — I10 Essential (primary) hypertension: Secondary | ICD-10-CM | POA: Diagnosis not present

## 2018-07-26 DIAGNOSIS — N183 Chronic kidney disease, stage 3 unspecified: Secondary | ICD-10-CM

## 2018-07-26 DIAGNOSIS — E669 Obesity, unspecified: Secondary | ICD-10-CM

## 2018-07-26 DIAGNOSIS — E785 Hyperlipidemia, unspecified: Secondary | ICD-10-CM

## 2018-07-26 DIAGNOSIS — E1169 Type 2 diabetes mellitus with other specified complication: Secondary | ICD-10-CM

## 2018-07-26 MED ORDER — LOSARTAN POTASSIUM 100 MG PO TABS: 100.0000 mg | ORAL_TABLET | Freq: Every day | ORAL | 0 refills | Status: DC

## 2018-07-26 MED ORDER — ATORVASTATIN CALCIUM 40 MG PO TABS: 40.0000 mg | ORAL_TABLET | Freq: Every day | ORAL | 0 refills | Status: DC

## 2018-07-26 NOTE — Progress Notes (Signed)
Name: Tammy Lozano   MRN: [id number]    DOB: 1940/11/30   Date:07/26/2018       Progress Note  Subjective  Chief Complaint  Chief Complaint  Patient presents with  . Medication Refill  . Hypertension    Denies any symptoms  . Hyperlipidemia  . CKI  . Diabetes    Last eye exam was a year ago.    I connected with  [name] on 07/26/18 at  2:00 PM EDT by telephone and verified that I am speaking with the correct person using two identifiers.  I discussed the limitations, risks, security and privacy concerns of performing an evaluation and management service by telephone and the availability of in person appointments. Staff also discussed with the patient that there may be a patient responsible charge related to this service. Patient Location: home  Provider Location: [facility]   HPI  Diabetes type II: diagnosed October 2018 with hgbA1C of 6.6%. She states recently seen by eye doctor and has mild cataracts. She denies polyphagia, polydipsia or polyuria. She is not taking aspirin and does not want to start She is obese, and discussed healthy diet, she also had multiple GFR's below 60, but last one improved. On ARB She is due for labs however has not been in quarantine for the past month and we will wait until July to get labs done   Hyperlipidemia: she takes Atorvastatin, no myalgias, compliant with medication. No chest pain   HTN: she takes hctz and Losartan. She denies chest pain or palpitation She states her bp machine broke and she is trying to get it replaced it now.   Morbid obesity: she states her weight was normal after her she had children , but gained weight when she lost her sister in 49.  She states not because of depression, just likes to eat. She has not been as active because of COVID-19. Eating mostly baked food that her grandson cooks.   CKI: sees Dr. Candiss Norse and is now on  ARB, [place] went by her house March 2020 , she will  see nephrologist in May    Patient Active Problem List   Diagnosis Date Noted  . Morbid obesity ([place]) 11/04/2017  . Osteopenia of left hip 06/01/2017  . Controlled type 2 diabetes mellitus with chronic kidney disease ([place]) 11/28/2015  . CKD (chronic kidney disease) stage 3, GFR 30-59 ml/min (HCC) 11/28/2015  . Hypertension 12/04/2014  . Hyperlipidemia 12/04/2014    Past Surgical History:  Procedure Laterality Date  . COLONOSCOPY W/ POLYPECTOMY  05/16/1994  . COLONOSCOPY W/ POLYPECTOMY  12/23/2011   adenomatous polyp  . COLONOSCOPY WITH PROPOFOL N/A 08/12/2017   Procedure: COLONOSCOPY WITH PROPOFOL;  Surgeon: Manya Silvas, MD;  Location: [facility] ENDOSCOPY;  Service: Endoscopy;  Laterality: N/A;  . KNEE SURGERY Left 2010   arthroscopic    Family History  Problem Relation Age of Onset  . Cancer Mother        lung  . Cancer Father        brain  . Aneurysm Sister   . Cancer Brother        pancreatic  . Cancer Brother        esophagus  . Colon cancer Paternal Grandfather     Social History   Socioeconomic History  . Marital status: Divorced    Spouse name: Not on file  . Number of children: 3  . Years of education: 21  .  Highest education level: 12th grade  Occupational History    Employer: RETIRED  Social Needs  . Financial resource strain: Not hard at all  . Food insecurity:    Worry: Never true    Inability: Never true  . Transportation needs:    Medical: No    Non-medical: No  Tobacco Use  . Smoking status: Former Smoker    Packs/day: 2.00    Years: 40.00    Pack years: 80.00    Types: Cigarettes    Start date: 1962    Last attempt to quit: 2002    Years since quitting: 18.3  . Smokeless tobacco: Never Used  . Tobacco comment: quit in 2002. Has not resumed smoking since 2002. Pt does not require smoking cessation materials.  Substance and Sexual Activity  . Alcohol use: No    Alcohol/week: 0.0 standard drinks  . Drug use: No  . Sexual activity:  Not Currently  Lifestyle  . Physical activity:    Days per week: 0 days    Minutes per session: 0 min  . Stress: Not at all  Relationships  . Social connections:    Talks on phone: More than three times a week    Gets together: More than three times a week    Attends religious service: Never    Active member of club or organization: No    Attends meetings of clubs or organizations: Never    Relationship status: Divorced  . Intimate partner violence:    Fear of current or ex partner: No    Emotionally abused: No    Physically abused: No    Forced sexual activity: No  Other Topics Concern  . Not on file  Social History Narrative   Lives with grandson , his wife and 3 great-grandchildren      Current Outpatient Medications:  .  atorvastatin (LIPITOR) 40 MG tablet, Take 1 tablet (40 mg total) by mouth daily., Disp: 90 tablet, Rfl: 1 .  Cholecalciferol (VITAMIN D-3) 1000 units CAPS, Take 1 capsule (1,000 Units total) by mouth daily., Disp: 30 capsule, Rfl: 0 .  losartan (COZAAR) 100 MG tablet, TAKE 1 TABLET BY MOUTH ONCE DAILY, Disp: 90 tablet, Rfl: 1 .  Omega-3 Fatty Acids (FISH OIL) 1000 MG CAPS, Take 1,000 mg by mouth daily. , Disp: , Rfl:  .  HYDROcodone-acetaminophen (NORCO/VICODIN) 5-325 MG tablet, Take 1 tablet by mouth every 8 (eight) hours as needed for moderate pain. (Patient not taking: Reported on 07/26/2018), Disp: 12 tablet, Rfl: 0 .  lidocaine (LIDODERM) 5 %, Place 1 patch onto the skin every 12 (twelve) hours. Remove & Discard patch within 12 hours or as directed by MD (Patient not taking: Reported on 07/26/2018), Disp: 10 patch, Rfl: 0 .  methylPREDNISolone (MEDROL DOSEPAK) 4 MG TBPK tablet, Take Tapered dose as directed (Patient not taking: Reported on 07/26/2018), Disp: 21 tablet, Rfl: 0  Allergies  Allergen Reactions  . Ace Inhibitors     cough    I personally reviewed active problem list, medication list, allergies, family history with the patient/caregiver  today.   ROS  Ten systems reviewed and is negative except as mentioned in HPI   Objective  Virtual encounter, vitals not obtained.  Body mass index is 38.07 kg/m.  Physical Exam  Alert and oriented, strong voice on the phone   PHQ2/9: Depression screen [facility] 2/9 07/26/2018 03/23/2018 02/05/2018 11/04/2017 04/20/2017  Decreased Interest 0 0 0 0 0  Down, Depressed, Hopeless 0 0 0  0 0  PHQ - 2 Score 0 0 0 0 0  Altered sleeping 0 0 0 - -  Tired, decreased energy 0 0 0 - -  Change in appetite 0 0 0 - -  Feeling bad or failure about yourself  0 0 0 - -  Trouble concentrating 0 0 0 - -  Moving slowly or fidgety/restless 0 0 0 - -  Suicidal thoughts 0 0 0 - -  PHQ-9 Score 0 0 0 - -  Difficult doing work/chores Not difficult at all - Not difficult at all - -   PHQ-2/9 Result is negative.    Fall Risk: Fall Risk  07/26/2018 03/23/2018 02/05/2018 11/04/2017 04/20/2017  Falls in the past year? 0 0 0 No No  Number falls in past yr: 0 0 0 - -  Injury with Fall? 0 - 0 - -  Risk for fall due to : - - - - -  Risk for fall due to: Comment - - - - -     Assessment & Plan  1. Hyperlipidemia, unspecified hyperlipidemia type  - atorvastatin (LIPITOR) 40 MG tablet; Take 1 tablet (40 mg total) by mouth daily.  Dispense: 90 tablet; Refill: 0  2. Essential hypertension  - losartan (COZAAR) 100 MG tablet; Take 1 tablet (100 mg total) by mouth daily.  Dispense: 90 tablet; Refill: 0  3. Diabetes mellitus type 2 in obese (HCC)  On life style modification only   4. Morbid obesity ([place])  Seems to be eating healthier   5. CKD (chronic kidney disease) stage 3, GFR 30-59 ml/min (HCC)  Keep follow up with nephrologist    I discussed the assessment and treatment plan with the patient. The patient was provided an opportunity to ask questions and all were answered. The patient agreed with the plan and demonstrated an understanding of the instructions.   The patient was advised to call back or  seek an in-person evaluation if the symptoms worsen or if the condition fails to improve as anticipated.  I provided 21  minutes of non-face-to-face time during this encounter.  Loistine Chance, MD

## 2018-09-15 ENCOUNTER — Telehealth: Payer: Self-pay | Admitting: Family Medicine

## 2018-09-15 NOTE — Chronic Care Management (AMB) (Signed)
°  Chronic Care Management   Outreach Note  09/15/2018 Name: Tammy Lozano MRN: [id number] DOB: 01/26/1941  Referred by: Steele Sizer, MD Reason for referral : No chief complaint on file.   An unsuccessful telephone outreach was attempted today. The patient was referred to the case management team by for assistance with chronic care management and care coordination.   Follow Up Plan: The care management team will reach out to the patient again over the next 7 days.   [place]  [email or web address]   ??[phone]

## 2018-09-21 NOTE — Chronic Care Management (AMB) (Signed)
°  Chronic Care Management   Outreach Note  09/21/2018 Name: Tammy Lozano MRN: [id number] DOB: [birth year]  Referred by: Steele Sizer, MD Reason for referral : Chronic Care Management (Initial CCM outreach was unsuccssful.) and Chronic Care Management (Second CCM outreach was unsuccessful. )   A second unsuccessful telephone outreach was attempted today. The patient was referred to the case management team for assistance with chronic care management and care coordination.   Follow Up Plan: The care management team will reach out to the patient again over the next 7 days.   [place]  [email or web address]   ??[phone]

## 2018-09-30 NOTE — Chronic Care Management (AMB) (Signed)
°  Chronic Care Management   Outreach Note  09/30/2018 Name: Tammy Lozano MRN: [id number] DOB: 03-04-1941  Referred by: Steele Sizer, MD Reason for referral : Chronic Care Management (Initial CCM outreach was unsuccssful.), Chronic Care Management (Second CCM outreach was unsuccessful. ), and Chronic Care Management (Third CCM outreach was unsuccessful )   Third unsuccessful telephone outreach was attempted today. The patient was referred to the case management team for assistance with chronic care management and care coordination. The patient's primary care provider has been notified of our unsuccessful attempts to make or maintain contact with the patient. The care management team is pleased to engage with this patient at any time in the future should he/she be interested in assistance from the care management team.   Follow Up Plan: The care management team is available to follow up with the patient after provider conversation with the patient regarding recommendation for care management engagement and subsequent re-referral to the care management team.   Lena  [email or web address]   ??[phone]

## 2018-10-28 ENCOUNTER — Ambulatory Visit (INDEPENDENT_AMBULATORY_CARE_PROVIDER_SITE_OTHER): Payer: Medicare Other | Admitting: Family Medicine

## 2018-10-28 ENCOUNTER — Encounter: Payer: Self-pay | Admitting: Family Medicine

## 2018-10-28 ENCOUNTER — Ambulatory Visit: Payer: Medicare Other | Admitting: Family Medicine

## 2018-10-28 ENCOUNTER — Other Ambulatory Visit: Payer: Self-pay

## 2018-10-28 VITALS — BP 143/88 | HR 73 | Temp 96.6°F | Resp 16 | Ht 64.0 in | Wt 226.1 lb

## 2018-10-28 DIAGNOSIS — E785 Hyperlipidemia, unspecified: Secondary | ICD-10-CM

## 2018-10-28 DIAGNOSIS — E669 Obesity, unspecified: Secondary | ICD-10-CM | POA: Diagnosis not present

## 2018-10-28 DIAGNOSIS — E559 Vitamin D deficiency, unspecified: Secondary | ICD-10-CM

## 2018-10-28 DIAGNOSIS — E1122 Type 2 diabetes mellitus with diabetic chronic kidney disease: Secondary | ICD-10-CM

## 2018-10-28 DIAGNOSIS — N183 Chronic kidney disease, stage 3 unspecified: Secondary | ICD-10-CM

## 2018-10-28 DIAGNOSIS — I10 Essential (primary) hypertension: Secondary | ICD-10-CM | POA: Diagnosis not present

## 2018-10-28 DIAGNOSIS — E7800 Pure hypercholesterolemia, unspecified: Secondary | ICD-10-CM

## 2018-10-28 DIAGNOSIS — E1169 Type 2 diabetes mellitus with other specified complication: Secondary | ICD-10-CM

## 2018-10-28 DIAGNOSIS — I129 Hypertensive chronic kidney disease with stage 1 through stage 4 chronic kidney disease, or unspecified chronic kidney disease: Secondary | ICD-10-CM

## 2018-10-28 LAB — POCT GLYCOSYLATED HEMOGLOBIN (HGB A1C): HbA1c, POC (controlled diabetic range): 6.3 % (ref 0.0–7.0)

## 2018-10-28 MED ORDER — LOSARTAN POTASSIUM 100 MG PO TABS: 100.0000 mg | ORAL_TABLET | Freq: Every day | ORAL | 1 refills | Status: DC

## 2018-10-28 MED ORDER — ATORVASTATIN CALCIUM 40 MG PO TABS: 40.0000 mg | ORAL_TABLET | Freq: Every day | ORAL | 1 refills | Status: DC

## 2018-10-28 NOTE — Progress Notes (Signed)
po

## 2018-10-28 NOTE — Progress Notes (Signed)
Name: Tammy Lozano   MRN: [id number]    DOB: 08-09-1940   Date:10/29/2018       Progress Note  Subjective  Chief Complaint  Chief Complaint  Patient presents with  . Medication Refill    3 month F/U  . Diabetes    HPI  Diabetes type II: diagnosed October 2018 with hgbA1C of 6.6%.She is due for another eye exam. She denies polyphagia, polydipsia or polyuria. She is not taking aspirin and is 78 yo , therefore we will not start it now. She is obese, she has gained weight 7 lbs since last year, she likes to eat sweets, but meals are healthy with fish and greens.   Hyperlipidemia: she takes Atorvastatin, no myalgias, compliant with medication. We will recheck labs   HTN: she takes losartan, states ace caused a cough, no chest pain or palpitation, she has a new bp machine at home and readings have been high sometimes up to 190's, this am at home it was 180, at our office her machine read 160 twice and with our manual cuff initially it was 140/100 but with rest it went down to 143/88. We will monitor bp , she will return next week for a nurse visit, for now we will continue current dose    Morbid obesity: she states her weight was normal after her children , but gained weight in 1982 after she lost her mother in 23. She gained weight since last visit, explained importance to avoid sweets to go down about 5 lbs and maintain weight after that   CKI: sees Dr. Candiss Norse and I s now on ARB, bp seems to high at home, if remains elevated when she returns next week we will resume hctz at lower dose of 12.5 mg and monitor  Patient Active Problem List   Diagnosis Date Noted  . Morbid obesity ([place]) 11/04/2017  . Osteopenia of left hip 06/01/2017  . Controlled type 2 diabetes mellitus with chronic kidney disease ([place]) 11/28/2015  . CKD (chronic kidney disease) stage 3, GFR 30-59 ml/min (HCC) 11/28/2015  . Hypertension 12/04/2014  . Hyperlipidemia 12/04/2014    Past Surgical History:  Procedure  Laterality Date  . COLONOSCOPY W/ POLYPECTOMY  05/16/1994  . COLONOSCOPY W/ POLYPECTOMY  12/23/2011   adenomatous polyp  . COLONOSCOPY WITH PROPOFOL N/A 08/12/2017   Procedure: COLONOSCOPY WITH PROPOFOL;  Surgeon: Manya Silvas, MD;  Location: [facility] ENDOSCOPY;  Service: Endoscopy;  Laterality: N/A;  . KNEE SURGERY Left 2010   arthroscopic    Family History  Problem Relation Age of Onset  . Cancer Mother        lung  . Cancer Father        brain  . Aneurysm Sister   . Cancer Brother        pancreatic  . Cancer Brother        esophagus  . Colon cancer Paternal Grandfather     Social History   Socioeconomic History  . Marital status: Divorced    Spouse name: Not on file  . Number of children: 3  . Years of education: 46  . Highest education level: 12th grade  Occupational History    Employer: RETIRED  Social Needs  . Financial resource strain: Not hard at all  . Food insecurity    Worry: Never true    Inability: Never true  . Transportation needs    Medical: No    Non-medical: No  Tobacco Use  . Smoking status: Former  Smoker    Packs/day: 2.00    Years: 40.00    Pack years: 80.00    Types: Cigarettes    Start date: 67    Quit date: 2002    Years since quitting: 18.5  . Smokeless tobacco: Never Used  . Tobacco comment: quit in 2002. Has not resumed smoking since 2002. Pt does not require smoking cessation materials.  Substance and Sexual Activity  . Alcohol use: No    Alcohol/week: 0.0 standard drinks  . Drug use: No  . Sexual activity: Not Currently  Lifestyle  . Physical activity    Days per week: 0 days    Minutes per session: 0 min  . Stress: Not at all  Relationships  . Social connections    Talks on phone: More than three times a week    Gets together: More than three times a week    Attends religious service: Never    Active member of club or organization: No    Attends meetings of clubs or organizations: Never    Relationship status:  Divorced  . Intimate partner violence    Fear of current or ex partner: No    Emotionally abused: No    Physically abused: No    Forced sexual activity: No  Other Topics Concern  . Not on file  Social History Narrative   Lives with grandson , his wife and 3 great-grandchildren      Current Outpatient Medications:  .  atorvastatin (LIPITOR) 40 MG tablet, Take 1 tablet (40 mg total) by mouth daily., Disp: 90 tablet, Rfl: 1 .  Cholecalciferol (VITAMIN D-3) 1000 units CAPS, Take 1 capsule (1,000 Units total) by mouth daily., Disp: 30 capsule, Rfl: 0 .  losartan (COZAAR) 100 MG tablet, Take 1 tablet (100 mg total) by mouth daily., Disp: 90 tablet, Rfl: 1 .  Omega-3 Fatty Acids (FISH OIL) 1000 MG CAPS, Take 1,000 mg by mouth daily. , Disp: , Rfl:   Allergies  Allergen Reactions  . Ace Inhibitors     cough    I personally reviewed active problem list, medication list, allergies, family history, social history with the patient/caregiver today.   ROS  Constitutional: Negative for fever , positive for  weight change.  Respiratory: Negative for cough and shortness of breath.   Cardiovascular: Negative for chest pain or palpitations.  Gastrointestinal: Negative for abdominal pain, no bowel changes.  Musculoskeletal: Positive  for gait problem but no  joint swelling.  Skin: Negative for rash.  Neurological: Negative for dizziness or headache.  No other specific complaints in a complete review of systems (except as listed in HPI above).  Objective  Vitals:   10/28/18 1208 10/28/18 1235  BP: (!) 140/100 (!) 143/88  Pulse: 73   Resp: 16   Temp: (!) 96.6 F (35.9 C)   TempSrc: Temporal   SpO2: 95%   Weight: 226 lb 1.6 oz (102.6 kg)   Height: 5\' 4"  (1.626 m)     Body mass index is 38.81 kg/m.  Physical Exam  Constitutional: Patient appears well-developed and well-nourished. Obese  No distress.  HEENT: head atraumatic, normocephalic, pupils equal and reactive to light,neck  supple Cardiovascular: Normal rate, regular rhythm and normal heart sounds.  No murmur heard. No BLE edema. Pulmonary/Chest: Effort normal and breath sounds normal. No respiratory distress. Abdominal: Soft.  There is no tenderness. Psychiatric: Patient has a normal mood and affect. behavior is normal. Judgment and thought content normal.  PHQ2/9: Depression screen PHQ  2/9 10/28/2018 07/26/2018 03/23/2018 02/05/2018 11/04/2017  Decreased Interest 0 0 0 0 0  Down, Depressed, Hopeless 0 0 0 0 0  PHQ - 2 Score 0 0 0 0 0  Altered sleeping 0 0 0 0 -  Tired, decreased energy 0 0 0 0 -  Change in appetite 0 0 0 0 -  Feeling bad or failure about yourself  0 0 0 0 -  Trouble concentrating 0 0 0 0 -  Moving slowly or fidgety/restless 0 0 0 0 -  Suicidal thoughts 0 0 0 0 -  PHQ-9 Score 0 0 0 0 -  Difficult doing work/chores - Not difficult at all - Not difficult at all -    phq 9 is negative  Fall Risk: Fall Risk  10/28/2018 07/26/2018 03/23/2018 02/05/2018 11/04/2017  Falls in the past year? 0 0 0 0 No  Number falls in past yr: 0 0 0 0 -  Injury with Fall? 0 0 - 0 -  Risk for fall due to : - - - - -  Risk for fall due to: Comment - - - - -  Follow up Falls evaluation completed - - - -    Functional Status Survey: Is the patient deaf or have difficulty hearing?: No Does the patient have difficulty seeing, even when wearing glasses/contacts?: No Does the patient have difficulty concentrating, remembering, or making decisions?: No Does the patient have difficulty walking or climbing stairs?: Yes Does the patient have difficulty dressing or bathing?: No Does the patient have difficulty doing errands alone such as visiting a doctor's office or shopping?: No    Assessment & Plan  1. Diabetes mellitus type 2 in obese (HCC)  - POCT HgB A1C - Lipid panel  2. Morbid obesity ([place])  Discussed with the patient the risk posed by an increased BMI. Discussed importance of portion control, calorie  counting and at least 150 minutes of physical activity weekly. Avoid sweet beverages and drink more water. Eat at least 6 servings of fruit and vegetables daily   3. CKD (chronic kidney disease) stage 3, GFR 30-59 ml/min (HCC)  - PTH, intact and calcium  4. Essential hypertension  - COMPLETE METABOLIC PANEL WITH GFR - CBC with Differential/Platelet BP high at home, it was high when she came in initially, we will refill losartan but may need to add hctz if remains elevated  5. Vitamin D deficiency  - VITAMIN D 25 Hydroxy (Vit-D Deficiency, Fractures)  6. Pure hypercholesterolemia  - Lipid panel  7. Hypertension in stage 3 chronic kidney disease due to type 2 diabetes mellitus [facility])  Sees nephrologist but last visit was cancelled because of COVID -19 we will check labs and send them a copy

## 2018-10-29 ENCOUNTER — Ambulatory Visit: Payer: Medicare Other | Admitting: Family Medicine

## 2018-10-29 LAB — CBC WITH DIFFERENTIAL/PLATELET
Absolute Monocytes: 313 cells/uL (ref 200–950)
Basophils Absolute: 41 cells/uL (ref 0–200)
Basophils Relative: 0.9 %
Eosinophils Absolute: 262 cells/uL (ref 15–500)
Eosinophils Relative: 5.7 %
HCT: 37.6 % (ref 35.0–45.0)
Hemoglobin: 12.3 g/dL (ref 11.7–15.5)
Lymphs Abs: 1233 cells/uL (ref 850–3900)
MCH: 29 pg (ref 27.0–33.0)
MCHC: 32.7 g/dL (ref 32.0–36.0)
MCV: 88.7 fL (ref 80.0–100.0)
MPV: 10.2 fL (ref 7.5–12.5)
Monocytes Relative: 6.8 %
Neutro Abs: 2751 cells/uL (ref 1500–7800)
Neutrophils Relative %: 59.8 %
Platelets: 239 10*3/uL (ref 140–400)
RBC: 4.24 10*6/uL (ref 3.80–5.10)
RDW: 14.4 % (ref 11.0–15.0)
Total Lymphocyte: 26.8 %
WBC: 4.6 10*3/uL (ref 3.8–10.8)

## 2018-10-29 LAB — COMPLETE METABOLIC PANEL WITH GFR
AG Ratio: 1.5 (calc) (ref 1.0–2.5)
ALT: 7 U/L (ref 6–29)
AST: 10 U/L (ref 10–35)
Albumin: 3.7 g/dL (ref 3.6–5.1)
Alkaline phosphatase (APISO): 72 U/L (ref 37–153)
BUN: 10 mg/dL (ref 7–25)
CO2: 29 mmol/L (ref 20–32)
Calcium: 9.1 mg/dL (ref 8.6–10.4)
Chloride: 105 mmol/L (ref 98–110)
Creat: 0.9 mg/dL (ref 0.60–0.93)
GFR, Est African American: 70 mL/min/{1.73_m2} (ref >=60)
GFR, Est Non African American: 61 mL/min/{1.73_m2} (ref >=60)
Globulin: 2.5 g/dL (calc) (ref 1.9–3.7)
Glucose, Bld: 105 mg/dL — ABNORMAL HIGH (ref 65–99)
Potassium: 3.4 mmol/L — ABNORMAL LOW (ref 3.5–5.3)
Sodium: 143 mmol/L (ref 135–146)
Total Bilirubin: 1.3 mg/dL — ABNORMAL HIGH (ref 0.2–1.2)
Total Protein: 6.2 g/dL (ref 6.1–8.1)

## 2018-10-29 LAB — LIPID PANEL
Cholesterol: 137 mg/dL (ref <200)
HDL: 41 mg/dL — ABNORMAL LOW (ref >=50)
LDL Cholesterol (Calc): 80 mg/dL (calc)
Non-HDL Cholesterol (Calc): 96 mg/dL (calc) (ref <130)
Total CHOL/HDL Ratio: 3.3 (calc) (ref <5.0)
Triglycerides: 78 mg/dL (ref <150)

## 2018-10-29 LAB — VITAMIN D 25 HYDROXY (VIT D DEFICIENCY, FRACTURES): Vit D, 25-Hydroxy: 46 ng/mL (ref 30–100)

## 2018-10-29 LAB — PTH, INTACT AND CALCIUM
Calcium: 9.1 mg/dL (ref 8.6–10.4)
PTH: 29 pg/mL (ref 14–64)

## 2018-11-05 ENCOUNTER — Other Ambulatory Visit: Payer: Self-pay

## 2018-11-05 ENCOUNTER — Ambulatory Visit: Payer: Medicare Other

## 2018-11-05 VITALS — BP 142/86 | HR 97

## 2018-11-05 DIAGNOSIS — I10 Essential (primary) hypertension: Secondary | ICD-10-CM

## 2019-01-07 DIAGNOSIS — H2513 Age-related nuclear cataract, bilateral: Secondary | ICD-10-CM | POA: Diagnosis not present

## 2019-01-26 ENCOUNTER — Telehealth: Payer: Self-pay | Admitting: Family Medicine

## 2019-01-26 DIAGNOSIS — I10 Essential (primary) hypertension: Secondary | ICD-10-CM

## 2019-01-26 MED ORDER — LOSARTAN POTASSIUM 100 MG PO TABS: 100.0000 mg | ORAL_TABLET | Freq: Every day | ORAL | 0 refills | Status: DC

## 2019-01-26 NOTE — Telephone Encounter (Signed)
Medication Refill - Medication: losartan (COZAAR) 100 MG tablet   Has the patient contacted their pharmacy? Yes.   (Agent: If no, request that the patient contact the pharmacy for the refill.) (Agent: If yes, when and what did the pharmacy advise?)  Preferred Pharmacy (with phone number or street name): [facility], [place]: Please be advised that RX refills may take up to 3 business days. We ask that you follow-up with your pharmacy.

## 2019-01-27 NOTE — Telephone Encounter (Signed)
Pt has an appt scheduled already for 02/28/2019

## 2019-01-31 ENCOUNTER — Telehealth: Payer: Self-pay | Admitting: Family Medicine

## 2019-01-31 NOTE — Chronic Care Management (AMB) (Signed)
Chronic Care Management   Note  01/31/2019 Name: SHERRITA RIEDERER MRN: [id number] DOB: 1940-05-28  SIEDAH SEDOR is a 78 y.o. year old female who is a primary care patient of Steele Sizer, MD. I reached out to [name] by phone today in response to a referral sent by Ms. Sharlett Iles Goodin's health plan.     Ms. Ferg was given information about Chronic Care Management services today including:  1. CCM service includes personalized support from designated clinical staff supervised by her physician, including individualized plan of care and coordination with other care providers 2. 24/7 contact phone numbers for assistance for urgent and routine care needs. 3. Service will only be billed when office clinical staff spend 20 minutes or more in a month to coordinate care. 4. Only one practitioner may furnish and bill the service in a calendar month. 5. The patient may stop CCM services at any time (effective at the end of the month) by phone call to the office staff. 6. The patient will be responsible for cost sharing (co-pay) of up to 20% of the service fee (after annual deductible is met).  Patient agreed to services and verbal consent obtained.   Follow up plan: Telephone appointment with CCM team member scheduled for: 02/23/2019  [place]  [email or web address]   ??[phone]

## 2019-02-23 ENCOUNTER — Other Ambulatory Visit: Payer: Self-pay

## 2019-02-23 ENCOUNTER — Ambulatory Visit: Payer: Medicare Other

## 2019-02-23 DIAGNOSIS — I10 Essential (primary) hypertension: Secondary | ICD-10-CM

## 2019-02-23 NOTE — Chronic Care Management (AMB) (Signed)
Chronic Care Management   Initial Visit Note  02/23/2019 Name: Tammy Lozano MRN: [id number] DOB: 08-25-1940    Referred by: Health Plan Reason for referral : Chronic Care Management PCP: Steele Sizer, MD   Tammy Lozano is a 78 y.o. year old female who is a primary care patient of Steele Sizer, MD. The CCM team was consulted for assistance with chronic disease management and care coordination needs. The primary focus of our conversation today was hypertension management.   Review of patient status, including review of consultants reports, relevant laboratory and other test results, and collaboration with appropriate team members was performed as part of comprehensive patient evaluation and provision of chronic care management services.    SDOH (Social Determinants of Health) screening was performed today. Tammy Lozano does not have Advanced Directive documents on file. States she is currently working on the documents and discussing preferences with her family members.     Medications: Outpatient Encounter Medications as of 02/23/2019  Medication Sig  . atorvastatin (LIPITOR) 40 MG tablet Take 1 tablet (40 mg total) by mouth daily.  . Cholecalciferol (VITAMIN D-3) 1000 units CAPS Take 1 capsule (1,000 Units total) by mouth daily.  [name] losartan (COZAAR) 100 MG tablet Take 1 tablet (100 mg total) by mouth daily.  . Omega-3 Fatty Acids (FISH OIL) 1000 MG CAPS Take 1,000 mg by mouth daily.    No facility-administered encounter medications on file as of 02/23/2019.      Objective:  BP Readings from Last 3 Encounters:  11/05/18 (!) 142/86  10/28/18 (!) 143/88  07/26/18 120/74    Lab Results  Component Value Date   HGBA1C 6.3 10/28/2018    Lab Results  Component Value Date   CHOL 137 10/28/2018   HDL 41 (L) 10/28/2018   LDLCALC 80 10/28/2018   TRIG 78 10/28/2018   CHOLHDL 3.3 10/28/2018        Goals Addressed            This Visit's Progress   . Cut out extra  servings   On track    Continue to eliminate large meals and snacks prior to bedtime    . diet   Improving    Continue to eliminate large meals and snacks prior to bedtime     . Hypertension Self-Management       Current Barriers:  . Chronic Disease Management support and education needs related to Hypertension.  Case Manager Clinical Goal(s):  [name] Over the next 30 days, patient will attend all scheduled medical appointments. Pending outreach with Dr. Ancil Boozer on 02/28/19. [name] Over the next 90 days, patient will demonstrate adherence to treatment plan for hypertension as evidenced by taking all medications as prescribed and adhering to a cardiac prudent diet/DASH diet. . Over the next 90 days, patient will demonstrate improved health management independence as evidenced by checking blood pressure as directed and recording readings.  Interventions:  . Evaluated current treatment plan related to hypertension self management and patient's adherence to plan as established by provider. . Reviewed medications. Confirmed patient was able to self-manage medications and afford prescriptions. Discussed importance of taking medications as prescribed and notifying provider with concerning side effects. . Advised patient to monitor her blood pressure at least three times a week if unable to monitor daily. She reports having a blood pressure monitor but does not check her blood pressure at home. Discussed parameters and importance of home monitoring/recording to identify trends.  . Reviewed scheduled/upcoming provider appointments. She  will be evaluated by Dr. Ancil Boozer on 02/28/19. Encouraged to attend appointment as scheduled to prevent delays in care. . Discussed plan regarding ongoing care management follow-up and provided direct contact information for nurse case manager.   Patient Self Care Activities:  . Self administers medications as prescribed . Attends scheduled provider appointments . Calls pharmacy  for medication refills . Attends church or other social activities . Performs ADL's independently  Initial goal documentation    .    [name] COMPLETED: Increase water intake   On track    Starting 03/17/16, I will increase my water intake to 4 glasses a day.          PLAN -Telephonic outreach with CCM team member scheduled for 03/16/19. -CCM Nurse Case Manager contact information was provided. Tammy Lozano was encouraged to call with questions or concerns if needed prior to the scheduled outreach.     [facility] Care Management [phone]

## 2019-02-23 NOTE — Patient Instructions (Addendum)
Goals Addressed            This Visit's Progress   . Cut out extra servings   On track    Continue to eliminate large meals and snacks prior to bedtime    . diet   Improving    Continue to eliminate large meals and snacks prior to bedtime     . Hypertension Self-Management       Current Barriers:  . Chronic Disease Management support and education needs related to Hypertension.  Case Manager Clinical Goal(s):  [name] Over the next 30 days, patient will attend all scheduled medical appointments. Pending outreach with Dr. Ancil Boozer on 02/28/19. [name] Over the next 90 days, patient will demonstrate adherence to treatment plan for hypertension as evidenced by taking all medications as prescribed and adhering to a cardiac prudent diet/DASH diet. . Over the next 90 days, patient will demonstrate improved health management independence as evidenced by checking blood pressure as directed and recording readings.  Interventions:  . Evaluated current treatment plan related to hypertension self management and patient's adherence to plan as established by provider. . Reviewed medications. Confirmed patient was able to self-manage medications and afford prescriptions. Discussed importance of taking medications as prescribed and notifying provider with concerning side effects. . Advised patient to monitor her blood pressure at least three times a week if unable to monitor daily. She reports having a blood pressure monitor but does not check her blood pressure at home. Discussed parameters and importance of home monitoring/recording to identify trends.  . Reviewed scheduled/upcoming provider appointments. She will be evaluated by Dr. Ancil Boozer on 02/28/19. Encouraged to attend appointment as scheduled to prevent delays in care. . Discussed plan regarding ongoing care management follow-up and provided direct contact information for nurse case manager.   Patient Self Care Activities:  . Self administers medications as  prescribed . Attends scheduled provider appointments . Calls pharmacy for medication refills . Attends church or other social activities . Performs ADL's independently  Initial goal documentation    .    [name] Increase water intake   On track    Starting 03/17/16, I will increase my water intake to 4 glasses a day.        Ms. Wanninger was given information about Chronic Care Management services today including:  1. CCM service includes personalized support from designated clinical staff supervised by her physician, including individualized plan of care and coordination with other care providers 2. 24/7 contact phone numbers for assistance for urgent and routine care needs. 3. Service will only be billed when office clinical staff spend 20 minutes or more in a month to coordinate care. 4. Only one practitioner may furnish and bill the service in a calendar month. 5. The patient may stop CCM services at any time (effective at the end of the month) by phone call to the office staff. 6. The patient will be responsible for cost sharing (co-pay) of up to 20% of the service fee (after annual deductible is met).  Patient agreed to services and verbal consent obtained on 01/31/19.    Ms. Sek verbalized understanding of the instructions provided today. She declined need for a printed copy of the instructions.  Telephonic outreach with the CCM team member scheduled for 03/16/19.  [facility] Care Management [phone]

## 2019-02-28 ENCOUNTER — Encounter: Payer: Self-pay | Admitting: Family Medicine

## 2019-02-28 ENCOUNTER — Ambulatory Visit (INDEPENDENT_AMBULATORY_CARE_PROVIDER_SITE_OTHER): Payer: Medicare Other | Admitting: Family Medicine

## 2019-02-28 ENCOUNTER — Other Ambulatory Visit: Payer: Self-pay

## 2019-02-28 VITALS — BP 130/76 | HR 94 | Temp 96.6°F | Resp 16 | Ht 64.0 in | Wt 228.2 lb

## 2019-02-28 DIAGNOSIS — E669 Obesity, unspecified: Secondary | ICD-10-CM

## 2019-02-28 DIAGNOSIS — E11628 Type 2 diabetes mellitus with other skin complications: Secondary | ICD-10-CM

## 2019-02-28 DIAGNOSIS — I10 Essential (primary) hypertension: Secondary | ICD-10-CM | POA: Diagnosis not present

## 2019-02-28 DIAGNOSIS — Z23 Encounter for immunization: Secondary | ICD-10-CM

## 2019-02-28 DIAGNOSIS — L84 Corns and callosities: Secondary | ICD-10-CM

## 2019-02-28 DIAGNOSIS — E1169 Type 2 diabetes mellitus with other specified complication: Secondary | ICD-10-CM

## 2019-02-28 DIAGNOSIS — E785 Hyperlipidemia, unspecified: Secondary | ICD-10-CM | POA: Diagnosis not present

## 2019-02-28 MED ORDER — ATORVASTATIN CALCIUM 40 MG PO TABS: 40.0000 mg | ORAL_TABLET | Freq: Every day | ORAL | 0 refills | Status: DC

## 2019-02-28 MED ORDER — LOSARTAN POTASSIUM 100 MG PO TABS: 100.0000 mg | ORAL_TABLET | Freq: Every day | ORAL | 0 refills | Status: DC

## 2019-02-28 NOTE — Patient Instructions (Signed)
Food Basics for Chronic Kidney Disease °When your kidneys are not working well, they cannot remove waste and excess substances from your blood as effectively as they did before. This can lead to a buildup and imbalance of these substances, which can worsen kidney damage and affect how your body functions. Certain foods lead to a buildup of these substances in the body. By changing your diet as recommended by your diet and nutrition specialist (dietitian) or health care provider, you could help prevent further kidney damage and delay or prevent the need for dialysis. °What are tips for following this plan? °General instructions ° °· Work with your health care provider and dietitian to develop a meal plan that is right for you. Foods you can eat, limit, or avoid will be different for each person depending on the stage of kidney disease and any other existing health conditions. °· Talk with your health care provider about whether you should take a vitamin and mineral supplement. °· Use standard measuring cups and spoons to measure servings of foods. Use a kitchen scale to measure portions of protein foods. °· If directed by your health care provider, avoid drinking too much fluid. Measure and count all liquids, including water, ice, soups, flavored gelatin, and frozen desserts such as popsicles or ice cream. °Reading food labels °· Check the amount of sodium in foods. Choose foods that have less than 300 milligrams (mg) per serving. °· Check the ingredient list for phosphorus or potassium-based additives or preservatives. °· Check the amount of saturated and trans fat. Limit or avoid these fats as told by your dietitian. °Shopping °· Avoid buying foods that are: °? Processed, frozen, or prepackaged. °? Calcium-enriched or fortified. °· Do not buy foods that have salt or sodium listed among the first five ingredients. °· Do not buy canned vegetables. °Cooking °· Replace animal proteins, such as meat, fish, eggs, or  dairy, with plant proteins from beans, nuts, and soy. °? Use soy milk instead of cow's milk. °? Add beans or tofu to soups, casseroles, or pasta dishes instead of meat. °· Soak vegetables, such as potatoes, before cooking to reduce potassium. To do this: °? Peel and cut into small pieces. °? Soak in warm water for at least 2 hours. For every 1 cup of vegetables, use 10 cups of water. °? Drain and rinse with warm water. °? Boil for at least 5 minutes. °Meal planning °· Limit the amount of protein from plant and animal sources you eat each day. °· Do not add salt to food when cooking or before eating. °· Eat meals and snacks at around the same time each day. °If you have diabetes: °· If you have diabetes (diabetes mellitus) and chronic kidney disease, it is important to keep your blood glucose in the target range recommended by your health care provider. Follow your diabetes management plan. This may include: °? Checking your blood glucose regularly. °? Taking oral medicines, insulin, or both. °? Exercising for at least 30 minutes on 5 or more days each week, or as told by your health care provider. °? Tracking how many servings of carbohydrates you eat at each meal. °· You may be given specific guidelines on how much of certain foods and nutrients you may eat, depending on your stage of kidney disease and whether you have high blood pressure (hypertension). Follow your meal plan as told by your dietitian. °What nutrients should be limited? °The items listed are not a complete list. Talk with your dietitian   about what dietary choices are best for you. °Potassium °Potassium affects how steadily your heart beats. If too much potassium builds up in your blood, it can cause an irregular heartbeat or even a heart attack. °You may need to eat less potassium, depending on your blood potassium levels and the stage of kidney disease. Talk to your dietitian about how much potassium you may have each day. °You may need to limit  or avoid foods that are high in potassium, such as: °· Milk and soy milk. °· Fruits, such as bananas, papaya, apricots, nectarines, melon, prunes, raisins, kiwi, and oranges. °· Vegetables, such as potatoes, sweet potatoes, yams, tomatoes, leafy greens, beets, okra, avocado, pumpkin, and winter squash. °· White and lima beans. °Phosphorus °Phosphorus is a mineral found in your bones. A balance between calcium and phosphorous is needed to build and maintain healthy bones. Too much phosphorus pulls calcium from your bones. This can make your bones weak and more likely to break. Too much phosphorus can also make your skin itch. °You may need to eat less phosphorus depending on your blood phosphorus levels and the stage of kidney disease. Talk to your dietitian about how much potassium you may have each day. You may need to take medicine to lower your blood phosphorus levels if diet changes do not help. °You may need to limit or avoid foods that are high in phosphorus, such as: °· Milk and dairy products. °· Dried beans and peas. °· Tofu, soy milk, and other soy-based meat replacements. °· Colas. °· Nuts and peanut butter. °· Meat, poultry, and fish. °· Bran cereals and oatmeals. °Protein °Protein helps you to make and keep muscle. It also helps in the repair of your body’s cells and tissues. One of the natural breakdown products of protein is a waste product called urea. When your kidneys are not working properly, they cannot remove wastes, such as urea, like they did before you developed chronic kidney disease. Reducing how much protein you eat can help prevent a buildup of urea in your blood. °Depending on your stage of kidney disease, you may need to limit foods that are high in protein. Sources of animal protein include: °· Meat (all types). °· Fish and seafood. °· Poultry. °· Eggs. °· Dairy. °Other protein foods include: °· Beans and legumes. °· Nuts and nut butter. °· Soy and tofu. °Sodium °Sodium, which is found  in salt, helps maintain a healthy balance of fluids in your body. Too much sodium can increase your blood pressure and have a negative effect on the function of your heart and lungs. Too much sodium can also cause your body to retain too much fluid, making your kidneys work harder. °Most people should have less than 2,300 milligrams (mg) of sodium each day. If you have hypertension, you may need to limit your sodium to 1,500 mg each day. Talk to your dietitian about how much sodium you may have each day. °You may need to limit or avoid foods that are high in sodium, such as: °· Salt seasonings. °· Soy sauce. °· Cured and processed meats. °· Salted crackers and snack foods. °· Fast food. °· Canned soups and most canned foods. °· Pickled foods. °· Vegetable juice. °· Boxed mixes or ready-to-eat boxed meals and side dishes. °· Bottled dressings, sauces, and marinades. °Summary °· Chronic kidney disease can lead to a buildup and imbalance of waste and excess substances in the body. Certain foods lead to a buildup of these substances. By adjusting   your intake of these foods, you could help prevent more kidney damage and delay or prevent the need for dialysis. °· Food adjustments are different for each person with chronic kidney disease. Work with a dietitian to set up nutrient goals and a meal plan that is right for you. °· If you have diabetes and chronic kidney disease, it is important to keep your blood glucose in the target range recommended by your health care provider. °This information is not intended to replace advice given to you by your health care provider. Make sure you discuss any questions you have with your health care provider. °Document Released: 06/14/2002 Document Revised: 07/15/2018 Document Reviewed: 03/19/2016 °Elsevier Patient Education © 2020 [employer] ° °

## 2019-02-28 NOTE — Progress Notes (Signed)
Name: Tammy Lozano   MRN: [id number]    DOB: [birth year]   Date:02/28/2019       Progress Note  Subjective  Chief Complaint  Chief Complaint  Patient presents with  . Hypertension  . Diabetes  . Hyperlipidemia    HPI  Diabetes type II: diagnosed October 2018 with hgbA1C of 6.6%, A1C has been at goal since. .She recently had an eye exam and we will obtain records. She denies polyphagia, polydipsia or polyuria. She is not taking aspirin and is 78 yo , therefore we will not start it now. She is obese, she has gained another 2 lbs since last visit. She states she likes sweets.. She will try to cut down, she will try to switch to dark chocolate if possible She has obesity and also CKI and is on ARB  Hyperlipidemia: she takes Atorvastatin, no myalgias, compliant with medication. Last HDL was lower at 41, discussed importance of eating of fish and tree nuts, try to move more    HTN: she takes losartan, states ace caused a cough, no chest pain or palpitation, she states bp at home has been in the 130's-140's/80's.   Morbid obesity: she states her weight as an adult was 158 lbs, after third child weight was 132 lbs and remained that way until 1982 after she lost her mother, and worse since 1991 when her sister diet, she went above 200 lbs and is gradually gaining more  CKI: sees Dr. Candiss Norse and I s now on ARB,bp at home has been better controlled, reviewed labs done this Summer, potassium slightly low and we will give her a list of high potassium diet today   Patient Active Problem List   Diagnosis Date Noted  . Morbid obesity ([place]) 11/04/2017  . Osteopenia of left hip 06/01/2017  . Controlled type 2 diabetes mellitus with chronic kidney disease ([place]) 11/28/2015  . CKD (chronic kidney disease) stage 3, GFR 30-59 ml/min 11/28/2015  . Hypertension 12/04/2014  . Hyperlipidemia 12/04/2014    Past Surgical History:  Procedure Laterality Date  . COLONOSCOPY W/ POLYPECTOMY  05/16/1994  .  COLONOSCOPY W/ POLYPECTOMY  12/23/2011   adenomatous polyp  . COLONOSCOPY WITH PROPOFOL N/A 08/12/2017   Procedure: COLONOSCOPY WITH PROPOFOL;  Surgeon: Manya Silvas, MD;  Location: [facility] ENDOSCOPY;  Service: Endoscopy;  Laterality: N/A;  . KNEE SURGERY Left 2010   arthroscopic    Family History  Problem Relation Age of Onset  . Cancer Mother        lung  . Cancer Father        brain  . Aneurysm Sister   . Cancer Brother        pancreatic  . Cancer Brother        esophagus  . Colon cancer Paternal Grandfather     Social History   Socioeconomic History  . Marital status: Divorced    Spouse name: Not on file  . Number of children: 3  . Years of education: 1  . Highest education level: 12th grade  Occupational History    Employer: RETIRED  Social Needs  . Financial resource strain: Not hard at all  . Food insecurity    Worry: Never true    Inability: Never true  . Transportation needs    Medical: No    Non-medical: No  Tobacco Use  . Smoking status: Former Smoker    Packs/day: 2.00    Years: 40.00    Pack years: 80.00  Types: Cigarettes    Start date: 40    Quit date: 2002    Years since quitting: 18.9  . Smokeless tobacco: Never Used  . Tobacco comment: quit in 2002. Has not resumed smoking since 2002. Pt does not require smoking cessation materials.  Substance and Sexual Activity  . Alcohol use: No    Alcohol/week: 0.0 standard drinks  . Drug use: No  . Sexual activity: Not Currently  Lifestyle  . Physical activity    Days per week: 0 days    Minutes per session: 0 min  . Stress: Not at all  Relationships  . Social connections    Talks on phone: More than three times a week    Gets together: More than three times a week    Attends religious service: Never    Active member of club or organization: No    Attends meetings of clubs or organizations: Never    Relationship status: Divorced  . Intimate partner violence    Fear of current or ex  partner: No    Emotionally abused: No    Physically abused: No    Forced sexual activity: No  Other Topics Concern  . Not on file  Social History Narrative   Lives with grandson , his wife and 3 great-grandchildren      Current Outpatient Medications:  .  atorvastatin (LIPITOR) 40 MG tablet, Take 1 tablet (40 mg total) by mouth daily., Disp: 90 tablet, Rfl: 0 .  Cholecalciferol (VITAMIN D-3) 1000 units CAPS, Take 1 capsule (1,000 Units total) by mouth daily., Disp: 30 capsule, Rfl: 0 .  losartan (COZAAR) 100 MG tablet, Take 1 tablet (100 mg total) by mouth daily., Disp: 90 tablet, Rfl: 0 .  Omega-3 Fatty Acids (FISH OIL) 1000 MG CAPS, Take 1,000 mg by mouth daily. , Disp: , Rfl:   Allergies  Allergen Reactions  . Ace Inhibitors     cough    I personally reviewed active problem list, medication list, allergies, family history, social history, health maintenance with the patient/caregiver today.   ROS  Constitutional: Negative for fever or weight change.  Respiratory: Negative for cough and shortness of breath.   Cardiovascular: Negative for chest pain or palpitations.  Gastrointestinal: Negative for abdominal pain, no bowel changes.  Musculoskeletal: Positive for gait problem but no joint swelling.  Skin: Negative for rash.  Neurological: Negative for dizziness or headache.  No other specific complaints in a complete review of systems (except as listed in HPI above).  Objective  Vitals:   02/28/19 1036 02/28/19 1041  BP: (!) 150/76 130/76  Pulse: 94   Resp: 16   Temp: (!) 96.6 F (35.9 C)   TempSrc: Temporal   SpO2: 97%   Weight: 228 lb 3.2 oz (103.5 kg)   Height: 5\' 4"  (1.626 m)     Body mass index is 39.17 kg/m.  Physical Exam  Constitutional: Patient appears well-developed and well-nourished. Obese No distress.  HEENT: head atraumatic, normocephalic, pupils equal and reactive to light Cardiovascular: Normal rate, regular rhythm and normal heart sounds.  No  murmur heard. No BLE edema. Pulmonary/Chest: Effort normal and breath sounds normal. No respiratory distress. Abdominal: Soft.  There is no tenderness. Psychiatric: Patient has a normal mood and affect. behavior is normal. Judgment and thought content normal.   Diabetic Foot Exam: Diabetic Foot Exam - Simple   Simple Foot Form Diabetic Foot exam was performed with the following findings: Yes 02/28/2019 11:14 AM  Visual Inspection  See comments: Yes Sensation Testing Intact to touch and monofilament testing bilaterally: Yes Pulse Check Posterior Tibialis and Dorsalis pulse intact bilaterally: Yes Comments Callus, hammer toe and thick toenails, does not want to see podiatrist       PHQ2/9: Depression screen [facility] 2/9 02/28/2019 02/23/2019 10/28/2018 07/26/2018 03/23/2018  Decreased Interest 0 0 0 0 0  Down, Depressed, Hopeless 0 0 0 0 0  PHQ - 2 Score 0 0 0 0 0  Altered sleeping 0 - 0 0 0  Tired, decreased energy 0 - 0 0 0  Change in appetite 0 - 0 0 0  Feeling bad or failure about yourself  0 - 0 0 0  Trouble concentrating 0 - 0 0 0  Moving slowly or fidgety/restless 0 - 0 0 0  Suicidal thoughts 0 - 0 0 0  PHQ-9 Score 0 - 0 0 0  Difficult doing work/chores - - - Not difficult at all -    phq 9 is negative   Fall Risk: Fall Risk  02/28/2019 02/23/2019 10/28/2018 07/26/2018 03/23/2018  Falls in the past year? 0 0 0 0 0  Number falls in past yr: 0 - 0 0 0  Injury with Fall? 0 - 0 0 -  Risk for fall due to : - Other (Comment) - - -  Risk for fall due to: Comment - Reports hx of arthritis. Using cane and walker as needed. - - -  Follow up - Falls prevention discussed Falls evaluation completed - -     Functional Status Survey: Is the patient deaf or have difficulty hearing?: No Does the patient have difficulty seeing, even when wearing glasses/contacts?: No Does the patient have difficulty concentrating, remembering, or making decisions?: No Does the patient have difficulty  walking or climbing stairs?: Yes Does the patient have difficulty dressing or bathing?: No Does the patient have difficulty doing errands alone such as visiting a doctor's office or shopping?: No    Assessment & Plan   1. Diabetes mellitus type 2 in obese [facility])  Reviewed diabetes diet  2. Morbid obesity ([place])  BMI above 35 with co-morbidities   3. Essential hypertension  - losartan (COZAAR) 100 MG tablet; Take 1 tablet (100 mg total) by mouth daily.  Dispense: 90 tablet; Refill: 0  4. Dyslipidemia (high LDL; low HDL)  On stating therapy  - atorvastatin (LIPITOR) 40 MG tablet; Take 1 tablet (40 mg total) by mouth daily.  Dispense: 90 tablet; Refill: 0  5. Dyslipidemia associated with type 2 diabetes mellitus ([facility])  - atorvastatin (LIPITOR) 40 MG tablet; Take 1 tablet (40 mg total) by mouth daily.  Dispense: 90 tablet; Refill: 0  6. Need for pneumococcal vaccine  - Pneumococcal conjugate vaccine 13-valent IM   8. Type 2 diabetes mellitus with pressure callus ([place])  She does not want to see podiatrist at this time

## 2019-03-16 ENCOUNTER — Ambulatory Visit (INDEPENDENT_AMBULATORY_CARE_PROVIDER_SITE_OTHER): Payer: Medicare Other

## 2019-03-16 DIAGNOSIS — E785 Hyperlipidemia, unspecified: Secondary | ICD-10-CM | POA: Diagnosis not present

## 2019-03-16 DIAGNOSIS — I10 Essential (primary) hypertension: Secondary | ICD-10-CM

## 2019-03-16 NOTE — Chronic Care Management (AMB) (Signed)
  Chronic Care Management   Follow Up Note   03/16/2019 Name: Tammy Lozano MRN: [id number] DOB: 22-Sep-1940  Primary Care Provider: Steele Sizer, MD Reason for referral : Chronic Care Management   Tammy Lozano is a 78 y.o. year old female who is a primary care patient of Steele Sizer, MD. She is currently engaged with the chronic care management team. A routine telephonic outreach was conducted today.  Review of Ms. Rockholt' status, including review of consultants reports, relevant labs and test results was conducted today. Collaboration with appropriate care team members was performed as part of the comprehensive evaluation and provision of chronic care management services.    SDOH (Social Determinants of Health) screening performed today.  Outpatient Encounter Medications as of 03/16/2019  Medication Sig  . atorvastatin (LIPITOR) 40 MG tablet Take 1 tablet (40 mg total) by mouth daily.  . Cholecalciferol (VITAMIN D-3) 1000 units CAPS Take 1 capsule (1,000 Units total) by mouth daily.  [name] losartan (COZAAR) 100 MG tablet Take 1 tablet (100 mg total) by mouth daily.  . Omega-3 Fatty Acids (FISH OIL) 1000 MG CAPS Take 1,000 mg by mouth daily.    No facility-administered encounter medications on file as of 03/16/2019.      Goals Addressed            This Visit's Progress   . Hypertension Self-Management   On track    Current Barriers:  . Chronic Disease Management support and education needs related to Hypertension.  Case Manager Clinical Goal(s):  [name] Over the next 90 days, patient will attend all scheduled medical appointments.  . Over the next 90 days, patient will demonstrate adherence to treatment plan for hypertension as evidenced by taking all medications as prescribed and adhering to a cardiac prudent diet/DASH diet. . Over the next 90 days, patient will demonstrate improved health management independence as evidenced by checking blood pressure as directed and recording  readings.  Interventions:  . Evaluated current treatment plan related to hypertension self management and patient's adherence to plan as established by provider. . Reviewed medications. Discussed importance of taking medications as prescribed and notifying provider with concerning side effects. Reports taking medications as prescribed. Denies concerns regarding medication management or prescription costs. . Advised patient to monitor her blood pressure at least three times a week if unable to monitor daily. Reports readings have been within range. . Reviewed scheduled/upcoming provider appointments.  Encouraged to attend appointment as scheduled to prevent delays in care. Reports attending appointments as scheduled. Denies concerns regarding transportation. . Discussed plan regarding ongoing care management follow-up and provided direct contact information for nurse case manager.   Patient Self Care Activities:  . Self administers medications as prescribed . Attends scheduled provider appointments . Calls pharmacy for medication refills . Attends church or other social activities . Performs ADL's independently  Please see past updates related to this goal by clicking on the "Past Updates" button in the selected goal     .       PLAN The care management team will follow up with Tammy Lozano in three months.     [facility] [phone]

## 2019-03-25 ENCOUNTER — Ambulatory Visit: Payer: Medicare Other

## 2019-04-14 ENCOUNTER — Ambulatory Visit: Payer: Medicare Other

## 2019-04-22 ENCOUNTER — Ambulatory Visit (INDEPENDENT_AMBULATORY_CARE_PROVIDER_SITE_OTHER): Payer: Medicare Other

## 2019-04-22 DIAGNOSIS — Z0000 Encounter for general adult medical examination without abnormal findings: Secondary | ICD-10-CM | POA: Diagnosis not present

## 2019-04-22 NOTE — Patient Instructions (Signed)
Tammy Lozano , Thank you for taking time to come for your Medicare Wellness Visit. I appreciate your ongoing commitment to your health goals. Please review the following plan we discussed and let me know if I can assist you in the future.   Screening recommendations/referrals: Colonoscopy: done 08/12/17. Repeat in 2022 Mammogram: postponed Bone Density: postponed Recommended yearly ophthalmology/optometry visit for glaucoma screening and checkup Recommended yearly dental visit for hygiene and checkup  Vaccinations: Influenza vaccine: postponed Pneumococcal vaccine: done 02/28/19 Tdap vaccine: due Shingles vaccine: Shingrix discussed. Please contact your pharmacy for coverage information.   Advanced directives: Please bring a copy of your health care power of attorney and living will to the office at your convenience once you have completed that paperwork  Conditions/risks identified: Recommend drinking 6-8 glasses of water per day  Next appointment: Please follow up in one year for your Medicare Annual Wellness visit.     Preventive Care 9 Years and Older, Female Preventive care refers to lifestyle choices and visits with your health care provider that can promote health and wellness. What does preventive care include?  A yearly physical exam. This is also called an annual well check.  Dental exams once or twice a year.  Routine eye exams. Ask your health care provider how often you should have your eyes checked.  Personal lifestyle choices, including:  Daily care of your teeth and gums.  Regular physical activity.  Eating a healthy diet.  Avoiding tobacco and drug use.  Limiting alcohol use.  Practicing safe sex.  Taking low-dose aspirin every day.  Taking vitamin and mineral supplements as recommended by your health care provider. What happens during an annual well check? The services and screenings done by your health care provider during your annual well check  will depend on your age, overall health, lifestyle risk factors, and family history of disease. Counseling  Your health care provider may ask you questions about your:  Alcohol use.  Tobacco use.  Drug use.  Emotional well-being.  Home and relationship well-being.  Sexual activity.  Eating habits.  History of falls.  Memory and ability to understand (cognition).  Work and work Statistician.  Reproductive health. Screening  You may have the following tests or measurements:  Height, weight, and BMI.  Blood pressure.  Lipid and cholesterol levels. These may be checked every 5 years, or more frequently if you are over 58 years old.  Skin check.  Lung cancer screening. You may have this screening every year starting at age 64 if you have a 30-pack-year history of smoking and currently smoke or have quit within the past 15 years.  Fecal occult blood test (FOBT) of the stool. You may have this test every year starting at age 51.  Flexible sigmoidoscopy or colonoscopy. You may have a sigmoidoscopy every 5 years or a colonoscopy every 10 years starting at age 13.  Hepatitis C blood test.  Hepatitis B blood test.  Sexually transmitted disease (STD) testing.  Diabetes screening. This is done by checking your blood sugar (glucose) after you have not eaten for a while (fasting). You may have this done every 1-3 years.  Bone density scan. This is done to screen for osteoporosis. You may have this done starting at age 76.  Mammogram. This may be done every 1-2 years. Talk to your health care provider about how often you should have regular mammograms. Talk with your health care provider about your test results, treatment options, and if necessary, the need for  more tests. Vaccines  Your health care provider may recommend certain vaccines, such as:  Influenza vaccine. This is recommended every year.  Tetanus, diphtheria, and acellular pertussis (Tdap, Td) vaccine. You may  need a Td booster every 10 years.  Zoster vaccine. You may need this after age 58.  Pneumococcal 13-valent conjugate (PCV13) vaccine. One dose is recommended after age 43.  Pneumococcal polysaccharide (PPSV23) vaccine. One dose is recommended after age 45. Talk to your health care provider about which screenings and vaccines you need and how often you need them. This information is not intended to replace advice given to you by your health care provider. Make sure you discuss any questions you have with your health care provider. Document Released: 04/20/2015 Document Revised: 12/12/2015 Document Reviewed: 01/23/2015 Elsevier Interactive Patient Education  2017 [place] Prevention in the Home Falls can cause injuries. They can happen to people of all ages. There are many things you can do to make your home safe and to help prevent falls. What can I do on the outside of my home?  Regularly fix the edges of walkways and driveways and fix any cracks.  Remove anything that might make you trip as you walk through a door, such as a raised step or threshold.  Trim any bushes or trees on the path to your home.  Use bright outdoor lighting.  Clear any walking paths of anything that might make someone trip, such as rocks or tools.  Regularly check to see if handrails are loose or broken. Make sure that both sides of any steps have handrails.  Any raised decks and porches should have guardrails on the edges.  Have any leaves, snow, or ice cleared regularly.  Use sand or salt on walking paths during winter.  Clean up any spills in your garage right away. This includes oil or grease spills. What can I do in the bathroom?  Use night lights.  Install grab bars by the toilet and in the tub and shower. Do not use towel bars as grab bars.  Use non-skid mats or decals in the tub or shower.  If you need to sit down in the shower, use a plastic, non-slip stool.  Keep the floor  dry. Clean up any water that spills on the floor as soon as it happens.  Remove soap buildup in the tub or shower regularly.  Attach bath mats securely with double-sided non-slip rug tape.  Do not have throw rugs and other things on the floor that can make you trip. What can I do in the bedroom?  Use night lights.  Make sure that you have a light by your bed that is easy to reach.  Do not use any sheets or blankets that are too big for your bed. They should not hang down onto the floor.  Have a firm chair that has side arms. You can use this for support while you get dressed.  Do not have throw rugs and other things on the floor that can make you trip. What can I do in the kitchen?  Clean up any spills right away.  Avoid walking on wet floors.  Keep items that you use a lot in easy-to-reach places.  If you need to reach something above you, use a strong step stool that has a grab bar.  Keep electrical cords out of the way.  Do not use floor polish or wax that makes floors slippery. If you must use wax, use non-skid  floor wax.  Do not have throw rugs and other things on the floor that can make you trip. What can I do with my stairs?  Do not leave any items on the stairs.  Make sure that there are handrails on both sides of the stairs and use them. Fix handrails that are broken or loose. Make sure that handrails are as long as the stairways.  Check any carpeting to make sure that it is firmly attached to the stairs. Fix any carpet that is loose or worn.  Avoid having throw rugs at the top or bottom of the stairs. If you do have throw rugs, attach them to the floor with carpet tape.  Make sure that you have a light switch at the top of the stairs and the bottom of the stairs. If you do not have them, ask someone to add them for you. What else can I do to help prevent falls?  Wear shoes that:  Do not have high heels.  Have rubber bottoms.  Are comfortable and fit you  well.  Are closed at the toe. Do not wear sandals.  If you use a stepladder:  Make sure that it is fully opened. Do not climb a closed stepladder.  Make sure that both sides of the stepladder are locked into place.  Ask someone to hold it for you, if possible.  Clearly mark and make sure that you can see:  Any grab bars or handrails.  First and last steps.  Where the edge of each step is.  Use tools that help you move around (mobility aids) if they are needed. These include:  Canes.  Walkers.  Scooters.  Crutches.  Turn on the lights when you go into a dark area. Replace any light bulbs as soon as they burn out.  Set up your furniture so you have a clear path. Avoid moving your furniture around.  If any of your floors are uneven, fix them.  If there are any pets around you, be aware of where they are.  Review your medicines with your doctor. Some medicines can make you feel dizzy. This can increase your chance of falling. Ask your doctor what other things that you can do to help prevent falls. This information is not intended to replace advice given to you by your health care provider. Make sure you discuss any questions you have with your health care provider. Document Released: 01/18/2009 Document Revised: 08/30/2015 Document Reviewed: 04/28/2014 Elsevier Interactive Patient Education  2017 [employer].

## 2019-04-22 NOTE — Progress Notes (Signed)
Subjective:   Tammy Lozano is a 79 y.o. female who presents for Medicare Annual (Subsequent) preventive examination.  Virtual Visit via Telephone Note  I connected with Tammy Lozano on 04/22/19 at  8:40 AM EST by telephone and verified that I am speaking with the correct person using two identifiers.  Medicare Annual Wellness visit completed telephonically due to Covid-19 pandemic.   Location: Patient: home Provider: office   I discussed the limitations, risks, security and privacy concerns of performing an evaluation and management service by telephone and the availability of in person appointments. The patient expressed understanding and agreed to proceed.  Some vital signs may be absent or patient reported.   Clemetine Marker, LPN    Review of Systems:   Cardiac Risk Factors include: advanced age (>49men, >66 women);dyslipidemia;hypertension;obesity (BMI >30kg/m2)     Objective:     Vitals: There were no vitals taken for this visit.  There is no height or weight on file to calculate BMI.  Advanced Directives 04/22/2019 03/29/2018 03/23/2018 12/26/2017 08/12/2017 03/20/2017 12/15/2016  Does Patient Have a Medical Advance Directive? No No No No No Yes No  Type of Advance Directive - - - - - Press photographer;Living will -  Copy of [place] in Chart? - - - - - No - copy requested -  Would patient like information on creating a medical advance directive? No - Patient declined No - Patient declined Yes (MAU/Ambulatory/Procedural Areas - Information given) - Yes (MAU/Ambulatory/Procedural Areas - Information given) - -    Tobacco Social History   Tobacco Use  Smoking Status Former Smoker  . Packs/day: 2.00  . Years: 40.00  . Pack years: 80.00  . Types: Cigarettes  . Start date: 59  . Quit date: 2002  . Years since quitting: 19.0  Smokeless Tobacco Never Used  Tobacco Comment   quit in 2002. Has not resumed smoking since 2002. Pt does not  require smoking cessation materials.     Counseling given: Not Answered Comment: quit in 2002. Has not resumed smoking since 2002. Pt does not require smoking cessation materials.   Clinical Intake:  Pre-visit preparation completed: Yes  Pain : No/denies pain Pain Score: 0-No pain     Diabetes: Yes CBG done?: No Did pt. bring in CBG monitor from home?: No   Nutrition Risk Assessment:  Has the patient had any N/V/D within the last 2 months?  No  Does the patient have any non-healing wounds?  No  Has the patient had any unintentional weight loss or weight gain?  No   Diabetes:  Is the patient diabetic?  Yes  If diabetic, was a CBG obtained today?  No  Did the patient bring in their glucometer from home?  No  How often do you monitor your CBG's? Pt does not actively check blood sugar.   Financial Strains and Diabetes Management:  Are you having any financial strains with the device, your supplies or your medication? No .  Does the patient want to be seen by Chronic Care Management for management of their diabetes?  No  Would the patient like to be referred to a Nutritionist or for Diabetic Management?  No   Diabetic Exams:  Diabetic Eye Exam: Completed 01/2019 per patient, left msg to request records from [facility] today.   Diabetic Foot Exam: Completed 02/28/19.   How often do you need to have someone help you when you read instructions, pamphlets, or other  written materials from your doctor or pharmacy?: 1 - Never  Interpreter Needed?: No  Information entered by :: Clemetine Marker LPN  Past Medical History:  Diagnosis Date  . Chronic kidney disease    stage 3  . Hyperlipidemia   . Hypertension   . Osteoarthritis    Past Surgical History:  Procedure Laterality Date  . COLONOSCOPY W/ POLYPECTOMY  05/16/1994  . COLONOSCOPY W/ POLYPECTOMY  12/23/2011   adenomatous polyp  . COLONOSCOPY WITH PROPOFOL N/A 08/12/2017   Procedure: COLONOSCOPY WITH PROPOFOL;   Surgeon: Manya Silvas, MD;  Location: [facility] ENDOSCOPY;  Service: Endoscopy;  Laterality: N/A;  . KNEE SURGERY Left 2010   arthroscopic   Family History  Problem Relation Age of Onset  . Cancer Mother        lung  . Cancer Father        brain  . Aneurysm Sister   . Cancer Brother        pancreatic  . Cancer Brother        esophagus  . Colon cancer Paternal Grandfather    Social History   Socioeconomic History  . Marital status: Divorced    Spouse name: Not on file  . Number of children: 3  . Years of education: 65  . Highest education level: 12th grade  Occupational History    Employer: RETIRED  Tobacco Use  . Smoking status: Former Smoker    Packs/day: 2.00    Years: 40.00    Pack years: 80.00    Types: Cigarettes    Start date: 1962    Quit date: 2002    Years since quitting: 19.0  . Smokeless tobacco: Never Used  . Tobacco comment: quit in 2002. Has not resumed smoking since 2002. Pt does not require smoking cessation materials.  Substance and Sexual Activity  . Alcohol use: No    Alcohol/week: 0.0 standard drinks  . Drug use: No  . Sexual activity: Not Currently  Other Topics Concern  . Not on file  Social History Narrative   Lives with grandson , his wife and 3 great-grandchildren    Social Determinants of Health   Financial Resource Strain: Low Risk   . Difficulty of Paying Living Expenses: Not hard at all  Food Insecurity: No Food Insecurity  . Worried About Charity fundraiser in the Last Year: Never true  . Ran Out of Food in the Last Year: Never true  Transportation Needs: No Transportation Needs  . Lack of Transportation (Medical): No  . Lack of Transportation (Non-Medical): No  Physical Activity: Inactive  . Days of Exercise per Week: 0 days  . Minutes of Exercise per Session: 0 min  Stress: No Stress Concern Present  . Feeling of Stress : Not at all  Social Connections: Moderately Isolated  . Frequency of Communication with Friends and  Family: More than three times a week  . Frequency of Social Gatherings with Friends and Family: More than three times a week  . Attends Religious Services: Never  . Active Member of Clubs or Organizations: No  . Attends Archivist Meetings: Never  . Marital Status: Divorced    Outpatient Encounter Medications as of 04/22/2019  Medication Sig  . atorvastatin (LIPITOR) 40 MG tablet Take 1 tablet (40 mg total) by mouth daily.  . Cholecalciferol (VITAMIN D-3) 1000 units CAPS Take 1 capsule (1,000 Units total) by mouth daily.  [name] losartan (COZAAR) 100 MG tablet Take 1 tablet (100 mg total)  by mouth daily.  . Omega-3 Fatty Acids (FISH OIL) 1000 MG CAPS Take 1,000 mg by mouth daily.    No facility-administered encounter medications on file as of 04/22/2019.    Activities of Daily Living In your present state of health, do you have any difficulty performing the following activities: 04/22/2019 02/28/2019  Hearing? N N  Comment declines hearing aids -  Vision? N N  Difficulty concentrating or making decisions? N N  Walking or climbing stairs? Y Y  Comment - -  Dressing or bathing? N N  Doing errands, shopping? N [place] and eating ? N -  Using the Toilet? N -  In the past six months, have you accidently leaked urine? N -  Do you have problems with loss of bowel control? N -  Managing your Medications? N -  Managing your Finances? N -  Housekeeping or managing your Housekeeping? N -  Some recent data might be hidden    Patient Care Team: Steele Sizer, MD as PCP - General (Family Medicine) Murlean Iba, MD as Consulting Physician (Nephrology) Neldon Labella, RN as Case Manager    Assessment:   This is a routine wellness examination for Daysi.  Exercise Activities and Dietary recommendations Current Exercise Habits: The patient does not participate in regular exercise at present, Exercise limited by: orthopedic condition(s)  Goals    . Cut out  extra servings     Continue to eliminate large meals and snacks prior to bedtime    . diet     Continue to eliminate large meals and snacks prior to bedtime     . Hypertension Self-Management     Current Barriers:  . Chronic Disease Management support and education needs related to Hypertension.  Case Manager Clinical Goal(s):  [name] Over the next 30 days, patient will attend all scheduled medical appointments. Pending outreach with Dr. Ancil Boozer on 02/28/19. [name] Over the next 90 days, patient will demonstrate adherence to treatment plan for hypertension as evidenced by taking all medications as prescribed and adhering to a cardiac prudent diet/DASH diet. . Over the next 90 days, patient will demonstrate improved health management independence as evidenced by checking blood pressure as directed and recording readings.  Interventions:  . Evaluated current treatment plan related to hypertension self management and patient's adherence to plan as established by provider. . Reviewed medications. Confirmed patient was able to self-manage medications and afford prescriptions. Discussed importance of taking medications as prescribed and notifying provider with concerning side effects. . Advised patient to monitor her blood pressure at least three times a week if unable to monitor daily. She reports having a blood pressure monitor but does not check her blood pressure at home. Discussed parameters and importance of home monitoring/recording to identify trends.  . Reviewed scheduled/upcoming provider appointments. She will be evaluated by Dr. Ancil Boozer on 02/28/19. Encouraged to attend appointment as scheduled to prevent delays in care. . Discussed plan regarding ongoing care management follow-up and provided direct contact information for nurse case manager.   Patient Self Care Activities:  . Self administers medications as prescribed . Attends scheduled provider appointments . Calls pharmacy for medication  refills . Attends church or other social activities . Performs ADL's independently  Initial goal documentation    .       Fall Risk Fall Risk  04/22/2019 02/28/2019 02/23/2019 10/28/2018 07/26/2018  Falls in the past year? 0 0 0 0 0  Number falls in past yr:  0 0 - 0 0  Injury with Fall? 0 0 - 0 0  Risk for fall due to : Impaired balance/gait - Other (Comment) - -  Risk for fall due to: Comment - - Reports hx of arthritis. Using cane and walker as needed. - -  Follow up Falls prevention discussed - Falls prevention discussed Falls evaluation completed -   FALL RISK PREVENTION PERTAINING TO THE HOME:  Any stairs in or around the home? Yes  If so, do they handrails? Yes   Home free of loose throw rugs in walkways, pet beds, electrical cords, etc? Yes  Adequate lighting in your home to reduce risk of falls? Yes   ASSISTIVE DEVICES UTILIZED TO PREVENT FALLS:  Life alert? No  Use of a cane, walker or w/c? Yes  Grab bars in the bathroom? No  Shower chair or bench in shower? Yes  Elevated toilet seat or a handicapped toilet? No   DME ORDERS:  DME order needed?  No   TIMED UP AND GO:  Was the test performed? No . Telephonic visit.   Education: Fall risk prevention has been discussed.  Intervention(s) required? No   Depression Screen PHQ 2/9 Scores 04/22/2019 02/28/2019 02/23/2019 10/28/2018  PHQ - 2 Score 0 0 0 0  PHQ- 9 Score - 0 - 0     Cognitive Function     6CIT Screen 04/22/2019 03/23/2018 03/20/2017 03/17/2016  What Year? 0 points 0 points 0 points 0 points  What month? 0 points 0 points 0 points 0 points  What time? 0 points 0 points 0 points 0 points  Count back from 20 0 points 0 points 0 points 0 points  Months in reverse 0 points 0 points 0 points 0 points  Repeat phrase 0 points 0 points 2 points 0 points  Total Score 0 0 2 0    Immunization History  Administered Date(s) Administered  . Pneumococcal Conjugate-13 02/28/2019  . Pneumococcal  Polysaccharide-23 11/04/2017    Qualifies for Shingles Vaccine? Yes . Due for Shingrix. Education has been provided regarding the importance of this vaccine. Pt has been advised to call insurance company to determine out of pocket expense. Advised may also receive vaccine at local pharmacy or Health Dept. Verbalized acceptance and understanding.  Tdap: Although this vaccine is not a covered service during a Wellness Exam, does the patient still wish to receive this vaccine today?  No .  Education has been provided regarding the importance of this vaccine. Advised may receive this vaccine at local pharmacy or Health Dept. Aware to provide a copy of the vaccination record if obtained from local pharmacy or Health Dept. Verbalized acceptance and understanding.  Flu Vaccine: Due for Flu vaccine. Does the patient want to receive this vaccine today?  No . Education has been provided regarding the importance of this vaccine but still declined. Advised may receive this vaccine at local pharmacy or Health Dept. Aware to provide a copy of the vaccination record if obtained from local pharmacy or Health Dept. Verbalized acceptance and understanding.  Pneumococcal Vaccine: Up to date   Screening Tests Health Maintenance  Topic Date Due  . OPHTHALMOLOGY EXAM  05/01/2018  . INFLUENZA VACCINE  07/06/2019 (Originally 11/06/2018)  . MAMMOGRAM  04/07/2020 (Originally 05/09/1958)  . DEXA SCAN  04/07/2020 (Originally 05/09/2004)  . HEMOGLOBIN A1C  04/30/2019  . FOOT EXAM  02/28/2020  . COLONOSCOPY  08/13/2022  . TETANUS/TDAP  01/25/2024  . PNA vac Low Risk Adult  Completed  Cancer Screenings:  Colorectal Screening: Completed 08/12/17. Repeat every 3 years;   Mammogram:  No longer required.   Bone Density: no longer required  Lung Cancer Screening: (Low Dose CT Chest recommended if Age 57-80 years, 30 pack-year currently smoking OR have quit w/in 15years.) does not qualify.    Additional  Screening:  Hepatitis C Screening: no longer required  Vision Screening: Recommended annual ophthalmology exams for early detection of glaucoma and other disorders of the eye. Is the patient up to date with their annual eye exam?  Yes  Who is the provider or what is the name of the office in which the pt attends annual eye exams? [facility] Screening: Recommended annual dental exams for proper oral hygiene  Community Resource Referral:  CRR required this visit?  No      Plan:     I have personally reviewed and addressed the Medicare Annual Wellness questionnaire and have noted the following in the patient's chart:  A. Medical and social history B. Use of alcohol, tobacco or illicit drugs  C. Current medications and supplements D. Functional ability and status E.  Nutritional status F.  Physical activity G. Advance directives H. List of other physicians I.  Hospitalizations, surgeries, and ER visits in previous 12 months [place] such as hearing and vision if needed, cognitive and depression L. Referrals and appointments   In addition, I have reviewed and discussed with patient certain preventive protocols, quality metrics, and best practice recommendations. A written personalized care plan for preventive services as well as general preventive health recommendations were provided to patient.   Signed,  Clemetine Marker, LPN Nurse Health Advisor   Nurse Notes: none

## 2019-04-27 ENCOUNTER — Encounter: Payer: Self-pay | Admitting: Family Medicine

## 2019-05-31 ENCOUNTER — Encounter: Payer: Self-pay | Admitting: Family Medicine

## 2019-05-31 ENCOUNTER — Ambulatory Visit (INDEPENDENT_AMBULATORY_CARE_PROVIDER_SITE_OTHER): Payer: Medicare Other | Admitting: Family Medicine

## 2019-05-31 ENCOUNTER — Other Ambulatory Visit: Payer: Self-pay

## 2019-05-31 VITALS — BP 136/74 | HR 89 | Temp 97.9°F | Resp 16 | Ht 64.0 in | Wt 228.5 lb

## 2019-05-31 DIAGNOSIS — I10 Essential (primary) hypertension: Secondary | ICD-10-CM | POA: Diagnosis not present

## 2019-05-31 DIAGNOSIS — E669 Obesity, unspecified: Secondary | ICD-10-CM

## 2019-05-31 DIAGNOSIS — E1169 Type 2 diabetes mellitus with other specified complication: Secondary | ICD-10-CM

## 2019-05-31 DIAGNOSIS — R809 Proteinuria, unspecified: Secondary | ICD-10-CM

## 2019-05-31 DIAGNOSIS — E785 Hyperlipidemia, unspecified: Secondary | ICD-10-CM

## 2019-05-31 DIAGNOSIS — N183 Chronic kidney disease, stage 3 unspecified: Secondary | ICD-10-CM

## 2019-05-31 DIAGNOSIS — E559 Vitamin D deficiency, unspecified: Secondary | ICD-10-CM

## 2019-05-31 DIAGNOSIS — E1129 Type 2 diabetes mellitus with other diabetic kidney complication: Secondary | ICD-10-CM

## 2019-05-31 DIAGNOSIS — E1122 Type 2 diabetes mellitus with diabetic chronic kidney disease: Secondary | ICD-10-CM

## 2019-05-31 DIAGNOSIS — M1712 Unilateral primary osteoarthritis, left knee: Secondary | ICD-10-CM

## 2019-05-31 LAB — POCT GLYCOSYLATED HEMOGLOBIN (HGB A1C): HbA1c, POC (controlled diabetic range): 6.1 % (ref 0.0–7.0)

## 2019-05-31 LAB — POCT UA - MICROALBUMIN: Microalbumin Ur, POC: 50 mg/L

## 2019-05-31 MED ORDER — LOSARTAN POTASSIUM 100 MG PO TABS: 100.0000 mg | ORAL_TABLET | Freq: Every day | ORAL | 1 refills | Status: DC

## 2019-05-31 MED ORDER — ATORVASTATIN CALCIUM 40 MG PO TABS: 40.0000 mg | ORAL_TABLET | Freq: Every day | ORAL | 1 refills | Status: DC

## 2019-05-31 NOTE — Progress Notes (Signed)
Name: Tammy Lozano   MRN: [id number]    DOB: 05/07/1940   Date:05/31/2019       Progress Note  Subjective  Chief Complaint  Chief Complaint  Patient presents with  . Diabetes    Needs a meter  . Hypertension    Denies any symptoms  . Hyperlipidemia  . Obesity  . Medication Refill    3 month F/U    HPI  Diabetes type II: diagnosed October 2018 with hgbA1C of 6.6%, A1C has been at goal since, today A1C was 6.1% . Marland KitchenShe recently had an eye exam and we will obtain records. She denies polyphagia, polydipsia or polyuria. She is obese, her weight is stable since last visit.  She states she likes sweets and buys whatever she wants. She has obesity and also CKI and is on ARB, urine micro today was 50   Hyperlipidemia: she takes Atorvastatin, no myalgias, compliant with medication. Last HDL was lower at 41, she is trying to eat more walnuts and pecans.  She is not very physically active because of OA   HTN: she takeslosartan,states ace caused a cough, no chest pain or palpitation, she states bp at home has been in the 130's-140's/80's.   OA: left side, also has left lower back pain   Morbid obesity: she states her weight as an adult was 158 lbs, after third child weight was 132 lbs and remained that way until 1982 after she lost her mother, and worse since 1991 when her sister diet, she went above 200 lbs , and was gradually gaining weight but has been coming down again and stable since last visit   CKI: she used to see Dr. 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 at 50 , now on ARB, she does not have a bp monitor at this time but will get a new one, also discussed coming by for CMA visit only and bp check if remains elevated before she leaves today   Patient Active Problem List   Diagnosis Date Noted  . Morbid obesity ([place]) 11/04/2017  . Osteopenia of left hip 06/01/2017  . Controlled type 2 diabetes  mellitus with chronic kidney disease ([place]) 11/28/2015  . CKD (chronic kidney disease) stage 3, GFR 30-59 ml/min 11/28/2015  . Hypertension 12/04/2014  . Hyperlipidemia 12/04/2014    Past Surgical History:  Procedure Laterality Date  . COLONOSCOPY W/ POLYPECTOMY  05/16/1994  . COLONOSCOPY W/ POLYPECTOMY  12/23/2011   adenomatous polyp  . COLONOSCOPY WITH PROPOFOL N/A 08/12/2017   Procedure: COLONOSCOPY WITH PROPOFOL;  Surgeon: Manya Silvas, MD;  Location: [facility]COPY;  Service: Endoscopy;  Laterality: N/A;  . KNEE SURGERY Left 2010   arthroscopic    Family History  Problem Relation Age of Onset  . Cancer Mother        lung  . Cancer Father        brain  . Aneurysm Sister   . Cancer Brother        pancreatic  . Cancer Brother        esophagus  . Colon cancer Paternal Grandfather     Social History   Tobacco Use  . Smoking status: Former Smoker    Packs/day: 2.00    Years: 40.00    Pack years: 80.00    Types: Cigarettes    Start date: 1962    Quit date: 2002    Years since quitting: 19.1  .  Smokeless tobacco: Never Used  . Tobacco comment: quit in 2002. Has not resumed smoking since 2002. Pt does not require smoking cessation materials.  Substance Use Topics  . Alcohol use: No    Alcohol/week: 0.0 standard drinks  . Drug use: No     Current Outpatient Medications:  .  atorvastatin (LIPITOR) 40 MG tablet, Take 1 tablet (40 mg total) by mouth daily., Disp: 90 tablet, Rfl: 0 .  Cholecalciferol (VITAMIN D-3) 1000 units CAPS, Take 1 capsule (1,000 Units total) by mouth daily., Disp: 30 capsule, Rfl: 0 .  losartan (COZAAR) 100 MG tablet, Take 1 tablet (100 mg total) by mouth daily., Disp: 90 tablet, Rfl: 0 .  Omega-3 Fatty Acids (FISH OIL) 1000 MG CAPS, Take 1,000 mg by mouth daily. , Disp: , Rfl:   Allergies  Allergen Reactions  . Ace Inhibitors     cough    I personally reviewed active problem list, medication list, allergies, family history, social  history with the patient/caregiver today.   ROS  Constitutional: Negative for fever or weight change.  Respiratory: Negative for cough and shortness of breath.   Cardiovascular: Negative for chest pain or palpitations.  Gastrointestinal: Negative for abdominal pain, no bowel changes.  Musculoskeletal: Positive  for gait problem but  joint swelling.  Skin: Negative for rash.  Neurological: Negative for dizziness or headache.  No other specific complaints in a complete review of systems (except as listed in HPI above).  Objective  Vitals:   05/31/19 1020  BP: (!) 160/84  Pulse: 89  Resp: 16  Temp: 97.9 F (36.6 C)  TempSrc: Temporal  SpO2: 92%  Weight: 228 lb 8 oz (103.6 kg)  Height: 5\' 4"  (1.626 m)    Body mass index is 39.22 kg/m.  Physical Exam  Constitutional: Patient appears well-developed and well-nourished. Obese  No distress.  HEENT: head atraumatic, normocephalic, pupils equal and reactive to light, Cardiovascular: Normal rate, regular rhythm and normal heart sounds.  No murmur heard. No BLE edema. Pulmonary/Chest: Effort normal and breath sounds normal. No respiratory distress. Abdominal: Soft.  There is no tenderness. Psychiatric: Patient has a normal mood and affect. behavior is normal. Judgment and thought content normal.  Recent Results (from the past 2160 hour(s))  POCT HgB A1C     Status: Normal   Collection Time: 05/31/19 10:37 AM  Result Value Ref Range   Hemoglobin A1C     HbA1c POC (<> result, manual entry)     HbA1c, POC (prediabetic range)     HbA1c, POC (controlled diabetic range) 6.1 0.0 - 7.0 %  POCT UA - Microalbumin     Status: Abnormal   Collection Time: 05/31/19 10:56 AM  Result Value Ref Range   Microalbumin Ur, POC 50 mg/L   Creatinine, POC     Albumin/Creatinine Ratio, Urine, POC      PHQ2/9: Depression screen [facility] 2/9 05/31/2019 04/22/2019 02/28/2019 02/23/2019 10/28/2018  Decreased Interest 0 0 0 0 0  Down, Depressed, Hopeless 0  0 0 0 0  PHQ - 2 Score 0 0 0 0 0  Altered sleeping 0 - 0 - 0  Tired, decreased energy 0 - 0 - 0  Change in appetite 0 - 0 - 0  Feeling bad or failure about yourself  0 - 0 - 0  Trouble concentrating 0 - 0 - 0  Moving slowly or fidgety/restless 0 - 0 - 0  Suicidal thoughts 0 - 0 - 0  PHQ-9 Score 0 - 0 -  0  Difficult doing work/chores - - - - -  Some recent data might be hidden    phq 9 is negative   Fall Risk: Fall Risk  05/31/2019 04/22/2019 02/28/2019 02/23/2019 10/28/2018  Falls in the past year? 0 0 0 0 0  Number falls in past yr: 0 0 0 - 0  Injury with Fall? 0 0 0 - 0  Risk for fall due to : - Impaired balance/gait - Other (Comment) -  Risk for fall due to: Comment - - - Reports hx of arthritis. Using cane and walker as needed. -  Follow up - Falls prevention discussed - Falls prevention discussed Falls evaluation completed    Functional Status Survey: Is the patient deaf or have difficulty hearing?: No Does the patient have difficulty seeing, even when wearing glasses/contacts?: Yes Does the patient have difficulty concentrating, remembering, or making decisions?: No Does the patient have difficulty walking or climbing stairs?: Yes(walks with a walker) Does the patient have difficulty dressing or bathing?: No Does the patient have difficulty doing errands alone such as visiting a doctor's office or shopping?: No    Assessment & Plan   1. Diabetes mellitus type 2 in obese (HCC)  - POCT HgB A1C - POCT UA - Microalbumin  2. Essential hypertension  - losartan (COZAAR) 100 MG tablet; Take 1 tablet (100 mg total) by mouth daily.  Dispense: 90 tablet; Refill: 1  3. Morbid obesity ([place])  Discussed with the patient the risk posed by an increased BMI. Discussed importance of portion control, calorie counting and at least 150 minutes of physical activity weekly. Avoid sweet beverages and drink more water. Eat at least 6 servings of fruit and vegetables daily   4.  Dyslipidemia associated with type 2 diabetes mellitus (HCC)  - atorvastatin (LIPITOR) 40 MG tablet; Take 1 tablet (40 mg total) by mouth daily.  Dispense: 90 tablet; Refill: 1  5. Vitamin D deficiency  She needs to take vitamin D otc supplements  6. Controlled type 2 diabetes mellitus with stage 3 chronic kidney disease, without long-term current use of insulin (HCC)  - losartan (COZAAR) 100 MG tablet; Take 1 tablet (100 mg total) by mouth daily.  Dispense: 90 tablet; Refill: 1  7. Diabetes mellitus with microalbuminuria (HCC)  - losartan (COZAAR) 100 MG tablet; Take 1 tablet (100 mg total) by mouth daily.  Dispense: 90 tablet; Refill: 1

## 2019-06-08 ENCOUNTER — Ambulatory Visit (INDEPENDENT_AMBULATORY_CARE_PROVIDER_SITE_OTHER): Payer: Medicare Other

## 2019-06-08 DIAGNOSIS — E785 Hyperlipidemia, unspecified: Secondary | ICD-10-CM | POA: Diagnosis not present

## 2019-06-08 DIAGNOSIS — E1169 Type 2 diabetes mellitus with other specified complication: Secondary | ICD-10-CM | POA: Diagnosis not present

## 2019-06-08 DIAGNOSIS — I10 Essential (primary) hypertension: Secondary | ICD-10-CM

## 2019-06-08 DIAGNOSIS — E669 Obesity, unspecified: Secondary | ICD-10-CM | POA: Diagnosis not present

## 2019-06-08 NOTE — Chronic Care Management (AMB) (Signed)
Chronic Care Management   Follow Up Note   06/08/2019 Name: Tammy Lozano MRN: [id number] DOB: 1940/09/19  Primary Care Provider: Steele Sizer, MD Reason for referral : Chronic Care Management  Tammy Lozano is a 79 y.o. year old female who is a primary care patient of Steele Sizer, MD. She is currently engaged with the chronic care management team. A routine telephonic outreach was conducted today.   She reports not receiving the Covid-19 vaccination. Prefers to receive the [place] when it becomes available.  Review of Tammy Lozano' status, including review of consultants reports, relevant labs and test results was conducted today. Collaboration with appropriate care team members was performed as part of the comprehensive evaluation and provision of chronic care management services.      Outpatient Encounter Medications as of 06/08/2019  Medication Sig  . atorvastatin (LIPITOR) 40 MG tablet Take 1 tablet (40 mg total) by mouth daily.  . Cholecalciferol (VITAMIN D-3) 1000 units CAPS Take 1 capsule (1,000 Units total) by mouth daily.  [name] losartan (COZAAR) 100 MG tablet Take 1 tablet (100 mg total) by mouth daily.  . Omega-3 Fatty Acids (FISH OIL) 1000 MG CAPS Take 1,000 mg by mouth daily.    No facility-administered encounter medications on file as of 06/08/2019.     Objective:  BP Readings from Last 3 Encounters:  05/31/19 136/74  02/28/19 130/76  11/05/18 (!) 142/86   Lab Results  Component Value Date   HGBA1C 6.1 05/31/2019    Lab Results  Component Value Date   CHOL 137 10/28/2018   HDL 41 (L) 10/28/2018   LDLCALC 80 10/28/2018   TRIG 78 10/28/2018   CHOLHDL 3.3 10/28/2018    Goals Addressed            This Visit's Progress   . Chronic Disease Management       Current Barriers:  . Chronic Disease Management support and education needs related to Hypertension, Diabetes and Hyperlipidemia.  Case Manager Clinical Goal(s):  [name] Over the next 90  days, patient will attend all scheduled medical appointments.  . Over the next 90 days, patient will continue to take medications as prescribed. . Over the next 90 days, patient will continue to adhere to recommended cardiac prudent/diabetic diet. . Over the next 90 days, patient will monitor blood pressure and maintain a log. . Over the next 90 days, patient will continue to adhere to recommended safety and fall prevention measures.   Interventions:  . Reviewed medications. Encouraged to continue taking medications as prescribed and notify team with concerns regarding prescription costs. . Discussed nutritional intake. Encouraged to continue adherence with recommended cardiac prudent/diabetic diet. Reports good appetite and continues attempting to adhere to diet. Diabetes is well controlled per last MD visit. [name] Discussed blood pressure parameters and indications for notifying MD. Encouraged to routinely monitor at home and maintain a log.  . Reviewed scheduled/upcoming provider appointments.  Encouraged to attend appointment as scheduled to prevent delays in care. Attended clinic visit with Dr. Ancil Boozer as scheduled on 05/31/19. Denies concerns regarding transportation/family continues to assist as needed. . Reviewed measures regarding safety/fall prevention and discussed activity tolerance. Reports doing very well and using her walker as needed. Denies changes in activity tolerance or need for additional assistance in the home. . Discussed plan regarding ongoing care management follow-up and provided direct contact information for nurse case manager.   Patient Self Care Activities:  . Self administers medications as prescribed . Attends  scheduled provider appointments . Calls pharmacy for medication refills . Attends church or other social activities . Performs ADL's independently  Please see past updates related to this goal by clicking on the "Past Updates" button in the selected goal      .         PLAN The care management team will follow up within the next two to three months. Tammy Lozano was encouraged to call with any urgent or health related concerns if needed prior to the next scheduled outreach.   [facility] Care Management [phone]

## 2019-06-23 ENCOUNTER — Ambulatory Visit: Payer: Medicare Other | Attending: Internal Medicine

## 2019-06-23 DIAGNOSIS — Z23 Encounter for immunization: Secondary | ICD-10-CM

## 2019-06-23 NOTE — Progress Notes (Signed)
   Covid-19 Vaccination Clinic  Name:  Tammy Lozano    MRN: [id number] DOB: 08-08-1940  06/23/2019  [name] was observed post Covid-19 immunization for 15 minutes without incident. She was provided with Vaccine Information Sheet and instruction to access the V-Safe system.   [name] was instructed to call 911 with any severe reactions post vaccine: [name] Difficulty breathing  . Swelling of face and throat  . A fast heartbeat  . A bad rash all over body  . Dizziness and weakness   Immunizations Administered    Name Date Dose VIS Date Route   Pfizer COVID-19 Vaccine 06/23/2019 10:06 AM 0.3 mL 03/18/2019 Intramuscular   Manufacturer: [place]   Lot: [id number]   [place]: [id number]

## 2019-07-19 ENCOUNTER — Ambulatory Visit: Payer: Medicare Other | Attending: Internal Medicine

## 2019-07-19 DIAGNOSIS — Z23 Encounter for immunization: Secondary | ICD-10-CM

## 2019-07-19 NOTE — Progress Notes (Signed)
   Covid-19 Vaccination Clinic  Name:  Tammy Lozano    MRN: [id number] DOB: July 25, 1940  07/19/2019  [name] was observed post Covid-19 immunization for 15 minutes without incident. She was provided with Vaccine Information Sheet and instruction to access the V-Safe system.   [name] was instructed to call 911 with any severe reactions post vaccine: [name] Difficulty breathing  . Swelling of face and throat  . A fast heartbeat  . A bad rash all over body  . Dizziness and weakness   Immunizations Administered    Name Date Dose VIS Date Route   Pfizer COVID-19 Vaccine 07/19/2019  9:54 AM 0.3 mL 03/18/2019 Intramuscular   Manufacturer: [place]   Lot: [id number]   [place]: [id number]

## 2019-07-26 NOTE — Patient Instructions (Signed)
Thank you for allowing the Chronic Care Management team to participate in your care.  Goals Addressed            This Visit's Progress   . Chronic Disease Management       Current Barriers:  . Chronic Disease Management support and education needs related to Hypertension, Diabetes and Hyperlipidemia.  Case Manager Clinical Goal(s):  [name] Over the next 90 days, patient will attend all scheduled medical appointments.  . Over the next 90 days, patient will continue to take medications as prescribed. . Over the next 90 days, patient will continue to adhere to recommended cardiac prudent/diabetic diet. . Over the next 90 days, patient will monitor blood pressure and maintain a log. . Over the next 90 days, patient will continue to adhere to recommended safety and fall prevention measures.   Interventions:  . Reviewed medications. Encouraged to continue taking medications as prescribed and notify team with concerns regarding prescription costs. . Discussed nutritional intake. Encouraged to continue adherence with recommended cardiac prudent/diabetic diet. Reports good appetite and continues attempting to adhere to diet. Diabetes is well controlled per last MD visit. [name] Discussed blood pressure parameters and indications for notifying MD. Encouraged to routinely monitor at home and maintain a log.  . Reviewed scheduled/upcoming provider appointments.  Encouraged to attend appointment as scheduled to prevent delays in care. Attended clinic visit with Dr. Ancil Boozer as scheduled on 05/31/19. Denies concerns regarding transportation/family continues to assist as needed. . Reviewed measures regarding safety/fall prevention and discussed activity tolerance. Reports doing very well and using her walker as needed. Denies changes in activity tolerance or need for additional assistance in the home. . Discussed plan regarding ongoing care management follow-up and provided direct contact information for nurse case  manager.   Patient Self Care Activities:  . Self administers medications as prescribed . Attends scheduled provider appointments . Calls pharmacy for medication refills . Attends church or other social activities . Performs ADL's independently  Please see past updates related to this goal by clicking on the "Past Updates" button in the selected goal     .       Ms. Carrazana verbalized understanding of the instructions provided during the telephonic outreach today. Declined need for a mailed/printed copy of the instructions.   The care management team will follow up within the next two to three months. Ms. Casperson was encouraged to call with any urgent or health related concerns if needed prior to the next scheduled outreach.   [facility] Care Management [phone]

## 2019-07-26 NOTE — Patient Instructions (Signed)
Thank you for allowing the Chronic Care Management team to participate in your care.  Goals Addressed            This Visit's Progress   . Hypertension Self-Management   On track    Current Barriers:  . Chronic Disease Management support and education needs related to Hypertension.  Case Manager Clinical Goal(s):  [name] Over the next 90 days, patient will attend all scheduled medical appointments.  . Over the next 90 days, patient will demonstrate adherence to treatment plan for hypertension as evidenced by taking all medications as prescribed and adhering to a cardiac prudent diet/DASH diet. . Over the next 90 days, patient will demonstrate improved health management independence as evidenced by checking blood pressure as directed and recording readings.  Interventions:  . Evaluated current treatment plan related to hypertension self management and patient's adherence to plan as established by provider. . Reviewed medications. Discussed importance of taking medications as prescribed and notifying provider with concerning side effects. Reports taking medications as prescribed. Denies concerns regarding medication management or prescription costs. . Advised patient to monitor her blood pressure at least three times a week if unable to monitor daily. Reports readings have been within range. . Reviewed scheduled/upcoming provider appointments.  Encouraged to attend appointment as scheduled to prevent delays in care. Reports attending appointments as scheduled. Denies concerns regarding transportation. . Discussed plan regarding ongoing care management follow-up and provided direct contact information for nurse case manager.   Patient Self Care Activities:  . Self administers medications as prescribed . Attends scheduled provider appointments . Calls pharmacy for medication refills . Attends church or other social activities . Performs ADL's independently  Please see past updates related to  this goal by clicking on the "Past Updates" button in the selected goal     .       Ms. Donchez verbalized understanding of the instructions provided during the telephonic outreach today. Declined need for a mailed/printed copy of the instructions.   Ms. Joshua continues to do very well. The care management team will follow-up in three months.    [facility] [phone]

## 2019-07-27 ENCOUNTER — Telehealth: Payer: Self-pay

## 2019-08-16 IMAGING — DX DG WRIST COMPLETE 3+V*R*
4 series · 4 of 4 positions shown · non-contrast
Comparison: None.

CLINICAL DATA: Initial evaluation for acute right wrist pain and
swelling. No known injury.

EXAM:
RIGHT WRIST - COMPLETE 3+ VIEW

[wrist ap (1 of 2)]
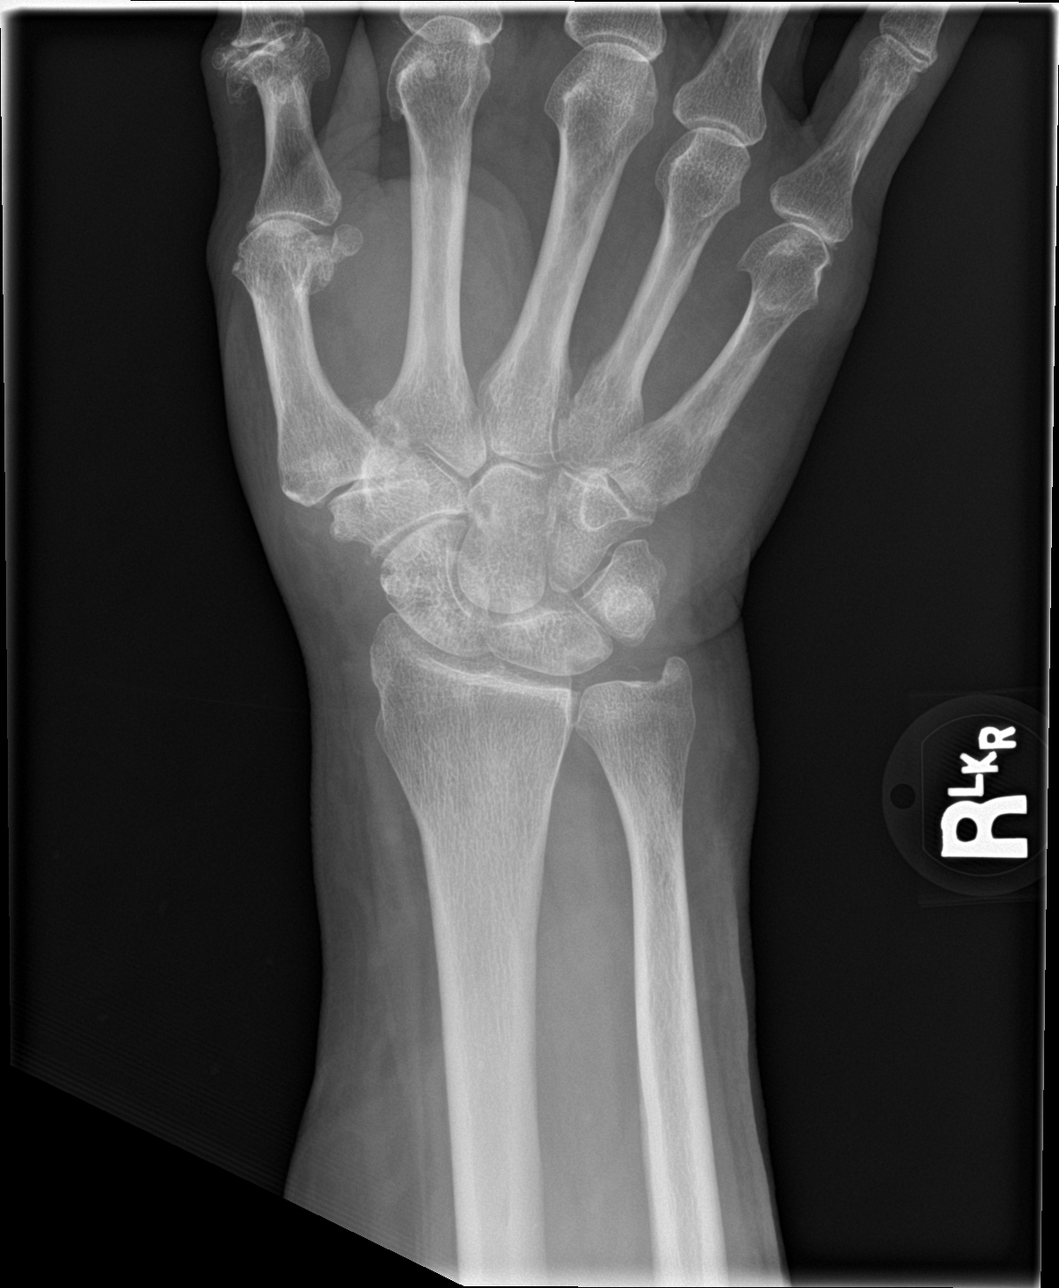

[wrist obl]
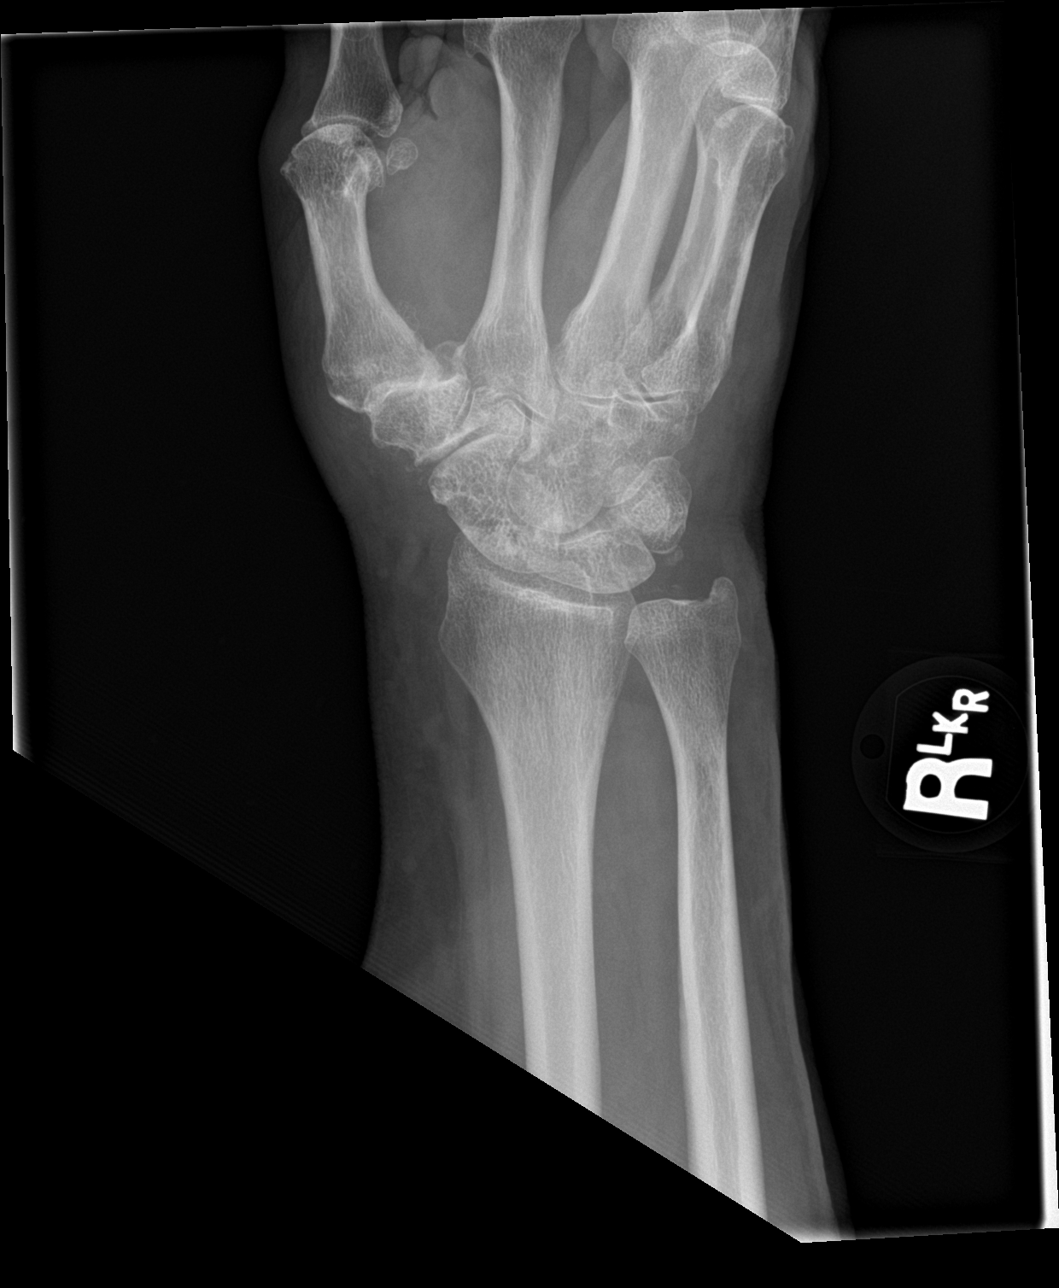

[wrist lat]
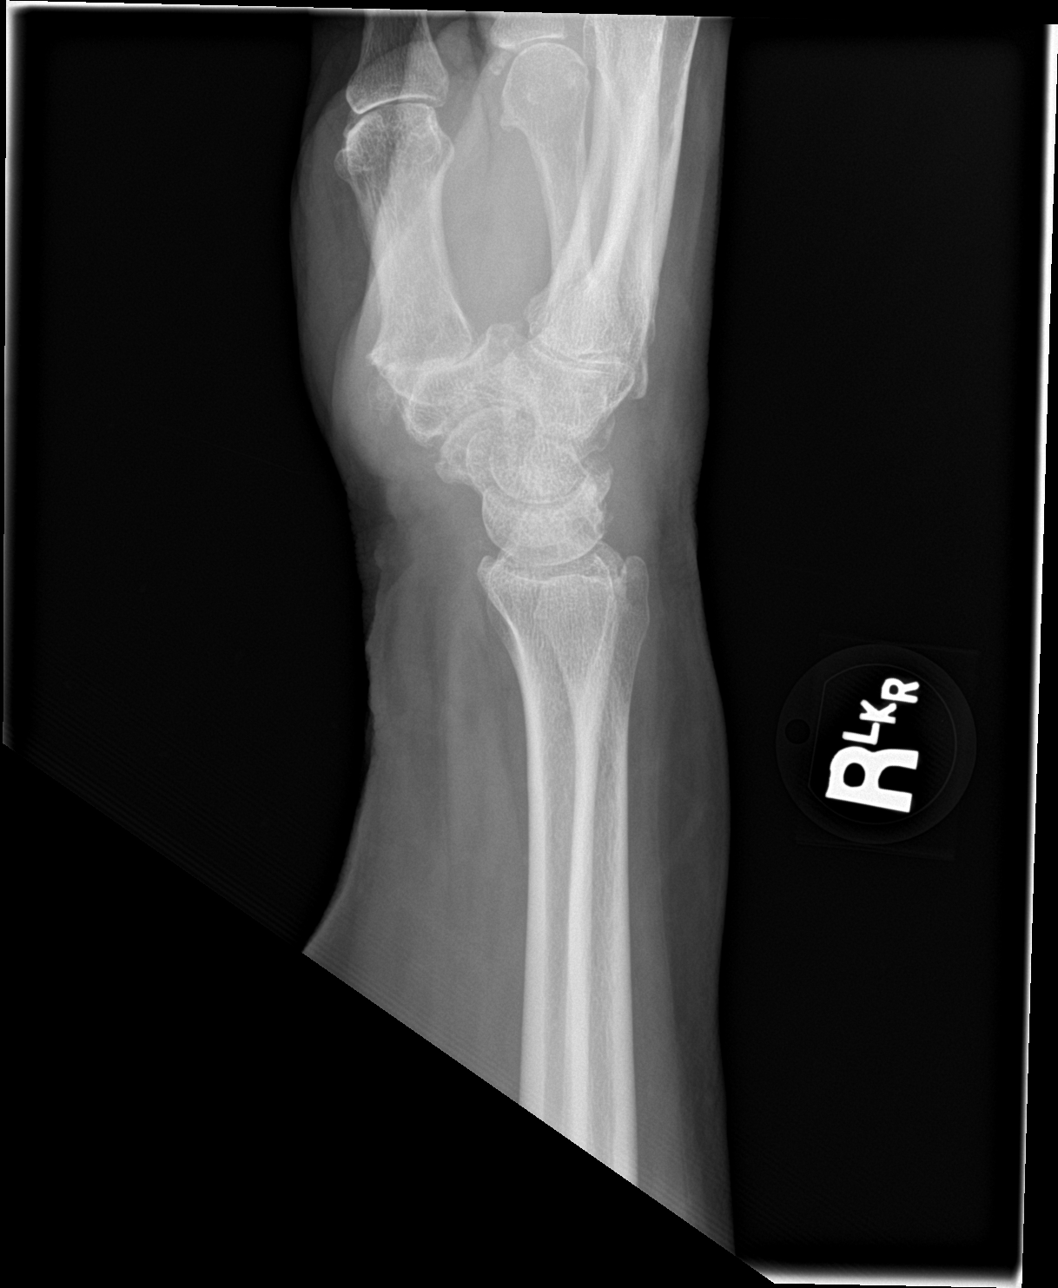

[wrist ap (2 of 2)]
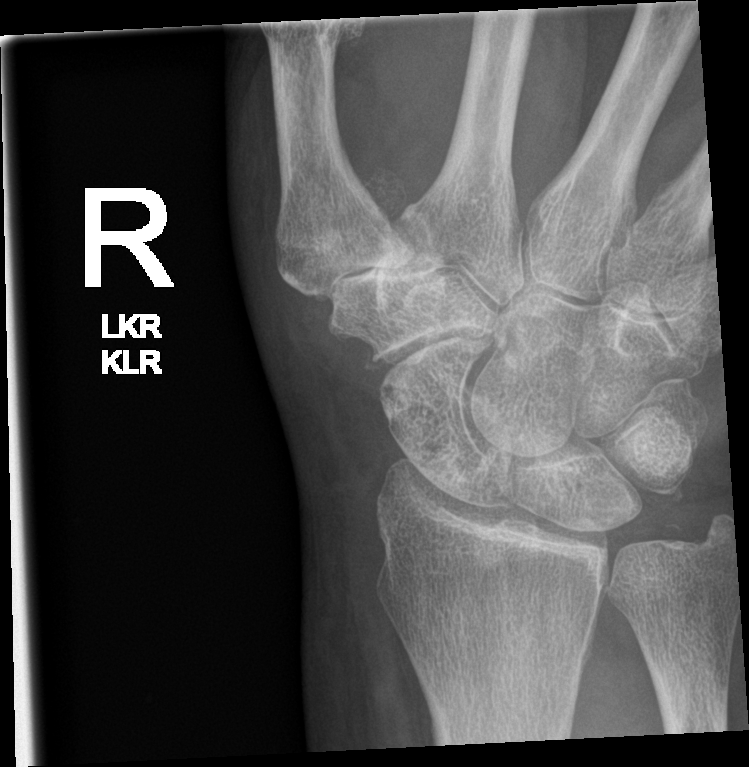

[4 of 4 positions shown; findings below may reference images not displayed]

FINDINGS: No acute fracture dislocation. Normal radiocarpal and distal
radioulnar articulations maintained. Scattered osteoarthritic
changes noted about the wrist with chondrocalcinosis in the region
of the try fibrocartilage complex. Prominent osteoarthritic changes
noted at the first CMC joint and thumb. Osseous mineralization
normal. No appreciable soft tissue abnormality.
IMPRESSION: 1. No acute osseous abnormality about the right wrist.
2. Moderate degenerative osteoarthritic changes about the wrist.

## 2019-08-26 ENCOUNTER — Ambulatory Visit: Payer: Self-pay

## 2019-08-26 DIAGNOSIS — I10 Essential (primary) hypertension: Secondary | ICD-10-CM

## 2019-08-29 NOTE — Chronic Care Management (AMB) (Signed)
  Chronic Care Management   Follow Up Note    Name: Tammy Lozano MRN: [id number] DOB: November 04, 1940  Primary Care Provider: Steele Sizer, MD Reason for referral : Chronic Care Management   Tammy Lozano is a 79 y.o. year old female who is a primary care patient of Steele Sizer, MD. She is currently engaged with the chronic care management team. A routine outreach was conducted today.  Review of [name]' status, including review of consultants reports, relevant labs and test results was conducted today. Collaboration with appropriate care team members was performed as part of the comprehensive evaluation and provision of chronic care management services.    SDOH (Social Determinants of Health) assessments performed: No     Outpatient Encounter Medications as of 08/26/2019  Medication Sig  . atorvastatin (LIPITOR) 40 MG tablet Take 1 tablet (40 mg total) by mouth daily.  . Cholecalciferol (VITAMIN D-3) 1000 units CAPS Take 1 capsule (1,000 Units total) by mouth daily.  [name] losartan (COZAAR) 100 MG tablet Take 1 tablet (100 mg total) by mouth daily.  . Omega-3 Fatty Acids (FISH OIL) 1000 MG CAPS Take 1,000 mg by mouth daily.    No facility-administered encounter medications on file as of 08/26/2019.      Goals Addressed            This Visit's Progress   . Chronic Disease Management       Current Barriers:  . Chronic Disease Management support and education needs related to Hypertension, Diabetes and Hyperlipidemia.  Case Manager Clinical Goal(s):  [name] Over the next 90 days, patient will attend all scheduled medical appointments. -Complete . Over the next 90 days, patient will continue to take medications as prescribed.-Complete . Over the next 90 days, patient will continue to adhere to recommended cardiac prudent/diabetic diet.-Complete . Over the next 90 days, patient will monitor blood pressure and maintain a log. . Over the next 90 days, patient will continue to adhere  to recommended safety and fall prevention measures.-Complete   Interventions:  . Reviewed medications. Continues to take medications as prescribed. No concerns regarding medication management or prescription costs. . Discussed nutritional intake. Encouraged to continue adherence with recommended cardiac prudent/diabetic diet. Doing well with adherence. Diabetes remains well controlled with diet and activity. A1C of 5.2% as of 05/31/19. [name] Discussed blood pressure readings. Reports readings have been within established range. Recent reading of 130/72. [name] Discussed plan regarding ongoing care management follow-up. Reports doing very well. She remains compliant with medications and provider treatment recommendations. Continues to attend appointments as scheduled. Denies concerns or changes in care management needs. Will plan to outreach every three months.   Patient Self Care Activities:  . Self administers medications as prescribed . Attends scheduled provider appointments . Calls pharmacy for medication refills . Attends church or other social activities . Performs ADL's independently  Please see past updates related to this goal by clicking on the "Past Updates" button in the selected goal     .        PLAN The care management team will follow-up with Ms. Heffner in three months.    [facility] [phone]

## 2019-08-29 NOTE — Patient Instructions (Addendum)
Thank you for allowing the Chronic Care Management team to participate in your care.  Goals Addressed            This Visit's Progress   . Chronic Disease Management       Current Barriers:  . Chronic Disease Management support and education needs related to Hypertension, Diabetes and Hyperlipidemia.  Case Manager Clinical Goal(s):  [name] Over the next 90 days, patient will attend all scheduled medical appointments. -Complete . Over the next 90 days, patient will continue to take medications as prescribed.-Complete . Over the next 90 days, patient will continue to adhere to recommended cardiac prudent/diabetic diet.-Complete . Over the next 90 days, patient will monitor blood pressure and maintain a log. . Over the next 90 days, patient will continue to adhere to recommended safety and fall prevention measures.-Complete   Interventions:  . Reviewed medications. Continues to take medications as prescribed. No concerns regarding medication management or prescription costs. . Discussed nutritional intake. Encouraged to continue adherence with recommended cardiac prudent/diabetic diet. Doing well with adherence. Diabetes remains well controlled with diet and activity. A1C of 5.2% as of 05/31/19. [name] Discussed blood pressure readings. Reports readings have been within established range. Recent reading of 130/72. [name] Discussed plan regarding ongoing care management follow-up. Reports doing very well. She remains compliant with medications and provider treatment recommendations. Continues to attend appointments as scheduled. Denies concerns or changes in care management needs. Will plan to outreach every three months.   Patient Self Care Activities:  . Self administers medications as prescribed . Attends scheduled provider appointments . Calls pharmacy for medication refills . Attends church or other social activities . Performs ADL's independently  Please see past updates related to this goal by  clicking on the "Past Updates" button in the selected goal     .       Tammy Lozano verbalized understanding of the instructions provided during the telephonic outreach. Declined need for mailed/printed instructions.   The care management team will follow-up with Tammy Lozano in three months.    [facility] [phone]

## 2019-11-11 ENCOUNTER — Ambulatory Visit: Payer: Self-pay

## 2019-11-11 DIAGNOSIS — I10 Essential (primary) hypertension: Secondary | ICD-10-CM

## 2019-11-11 NOTE — Chronic Care Management (AMB) (Signed)
  Chronic Care Management   Follow Up Note   11/11/2019 Name: Tammy Lozano MRN: [id number] DOB: 12-Dec-1940  Primary Care Provider: Steele Sizer, MD Reason for referral : Chronic Care Management   Tammy Lozano is a 79 y.o. year old female who is a primary care patient of Steele Sizer, MD. She was previously engaged with the chronic care management team. A brief telephonic assessment was conducted today. She reports doing very well. She has met her care management.  Review of Tammy Lozano' status, including review of consultants reports, relevant labs and test results was conducted today. Collaboration with appropriate care team members was performed as part of the comprehensive evaluation and provision of chronic care management services.    SDOH (Social Determinants of Health) assessments performed: No    Outpatient Encounter Medications as of 11/11/2019  Medication Sig  . atorvastatin (LIPITOR) 40 MG tablet Take 1 tablet (40 mg total) by mouth daily.  . Cholecalciferol (VITAMIN D-3) 1000 units CAPS Take 1 capsule (1,000 Units total) by mouth daily.  [name] losartan (COZAAR) 100 MG tablet Take 1 tablet (100 mg total) by mouth daily.  . Omega-3 Fatty Acids (FISH OIL) 1000 MG CAPS Take 1,000 mg by mouth daily.    No facility-administered encounter medications on file as of 11/11/2019.      Goals Addressed            This Visit's Progress   . COMPLETED: Chronic Disease Management       Current Barriers:  . Chronic Disease Management support and education needs related to Hypertension, Diabetes and Hyperlipidemia.  Case Manager Clinical Goal(s):  [name] Over the next 90 days, patient will attend all scheduled medical appointments. -Complete . Over the next 90 days, patient will continue to take medications as prescribed.-Complete . Over the next 90 days, patient will continue to adhere to recommended cardiac prudent/diabetic diet.-Complete . Over the next 90 days, patient will monitor  blood pressure and maintain a log. . Over the next 90 days, patient will continue to adhere to recommended safety and fall prevention measures.-Complete   Interventions:  Discussed plan regarding ongoing care management. Tammy Lozano continues to do well and has met her chronic care management goals.  Reports taking medications as prescribed and following treatment recommendations. Reports her home BP readings remain within range. No changes in activity tolerance or ability to complete self care. She has good family support. Denies urgent concerns or changes in care management needs. She is scheduled for PCP outreach on 11/28/19.    Patient Self Care Activities:  . Self administers medications as prescribed . Attends scheduled provider appointments . Calls pharmacy for medication refills . Attends church or other social activities . Performs ADL's independently  Please see past updates related to this goal by clicking on the "Past Updates" button in the selected goal     .         PLAN Tammy Lozano has met her chronic care management goals. The care management team will gladly outreach in the future if her needs change and she requires assistance. She is scheduled for PCP outreach on 11/28/19.    [facility] [phone]

## 2019-11-14 NOTE — Patient Instructions (Addendum)
Thank you for allowing the Chronic Care Management team to participate in your care.   Goals Addressed            This Visit's Progress   . COMPLETED: Chronic Disease Management       Current Barriers:  . Chronic Disease Management support and education needs related to Hypertension, Diabetes and Hyperlipidemia.  Case Manager Clinical Goal(s):  [name] Over the next 90 days, patient will attend all scheduled medical appointments. -Complete . Over the next 90 days, patient will continue to take medications as prescribed.-Complete . Over the next 90 days, patient will continue to adhere to recommended cardiac prudent/diabetic diet.-Complete . Over the next 90 days, patient will monitor blood pressure and maintain a log. . Over the next 90 days, patient will continue to adhere to recommended safety and fall prevention measures.-Complete   Interventions:  Discussed plan regarding ongoing care management. Ms. Pevehouse continues to do well and has met her chronic care management goals.  Reports taking medications as prescribed and following treatment recommendations. Reports her home BP readings remain within range. No changes in activity tolerance or ability to complete self care. She has good family support. Denies urgent concerns or changes in care management needs. She is scheduled for PCP outreach on 11/28/19.    Patient Self Care Activities:  . Self administers medications as prescribed . Attends scheduled provider appointments . Calls pharmacy for medication refills . Attends church or other social activities . Performs ADL's independently  Please see past updates related to this goal by clicking on the "Past Updates" button in the selected goal     .       Ms. Nieland verbalized understanding of the information discussed during the telephonic outreach today. Declined need for a mailed/printed copy of the information.    Ms. Fees has met her chronic care management goals. The  care management team will gladly outreach in the future if her needs change and she requires assistance. She is scheduled for PCP outreach on 11/28/19.    [facility] [phone]

## 2019-11-25 NOTE — Patient Instructions (Addendum)
Potassium Content of Foods  Potassium is a mineral found in many foods and drinks. It affects how the heart works, and helps keep fluids and minerals balanced in the body. The amount of potassium you need each day depends on your age and any medical conditions you may have. Talk to your health care provider or dietitian about how much potassium you need. The following lists of foods provide the general serving size for foods and the approximate amount of potassium in each serving, listed in milligrams (mg). Actual values may vary depending on the product and how it is processed. High in potassium The following foods and beverages have 200 mg or more of potassium per serving:  Apricots (raw) - 2 have 200 mg of potassium.  Apricots (dry) - 5 have 200 mg of potassium.  Artichoke - 1 medium has 345 mg of potassium.  Avocado -  fruit has 245 mg of potassium.  Banana - 1 medium fruit has 425 mg of potassium.  [place] or baked beans (canned) -  cup has 280 mg of potassium.  White beans (canned) -  cup has 595 mg potassium.  Beef roast - 3 oz has 320 mg of potassium.  Ground beef - 3 oz has 270 mg of potassium.  Beets (raw or cooked) -  cup has 260 mg of potassium.  Bran muffin - 2 oz has 300 mg of potassium.  Broccoli (cooked) -  cup has 230 mg of potassium.  Brussels sprouts -  cup has 250 mg of potassium.  Cantaloupe -  cup has 215 mg of potassium.  Cereal, 100% bran -  cup has 200-400 mg of potassium.  Cheeseburger -1 single fast food burger has 225-400 mg of potassium.  Chicken - 3 oz has 220 mg of potassium.  Clams (canned) - 3 oz has 535 mg of potassium.  Crab - 3 oz has 225 mg of potassium.  Dates - 5 have 270 mg of potassium.  Dried beans and peas -  cup has 300-475 mg of potassium.  Figs (dried) - 2 have 260 mg of potassium.  Fish (halibut, tuna, cod, snapper) - 3 oz has 480 mg of potassium.  Fish (salmon, haddock, swordfish, perch) - 3 oz has 300 mg of  potassium.  Fish (tuna, canned) - 3 oz has 200 mg of potassium.  [place] fries (fast food) - 3 oz has 470 mg of potassium.  Granola with fruit and nuts -  cup has 200 mg of potassium.  Grapefruit juice -  cup has 200 mg of potassium.  Honeydew melon -  cup has 200 mg of potassium.  Kale (raw) - 1 cup has 300 mg of potassium.  Kiwi - 1 medium fruit has 240 mg of potassium.  Kohlrabi, rutabaga, parsnips -  cup has 280 mg of potassium.  Lentils -  cup has 365 mg of potassium.  Mango - 1 each has 325 mg of potassium.  Milk (nonfat, low-fat, whole, buttermilk) - 1 cup has 350-380 mg of potassium.  Milk (chocolate) - 1 cup has 420 mg of potassium  Molasses - 1 Tbsp has 295 mg of potassium.  Mushrooms -  cup has 280 mg of potassium.  Nectarine - 1 each has 275 mg of potassium.  Nuts (almonds, peanuts, hazelnuts, [place], cashew, mixed) - 1 oz has 200 mg of potassium.  Nuts (pistachios) - 1 oz has 295 mg of potassium.  Orange - 1 fruit has 240 mg of potassium.  Orange juice -  cup has 235 mg of potassium.  Papaya -  medium fruit has 390 mg of potassium.  Peanut butter (chunky) - 2 Tbsp has 240 mg of potassium.  Peanut butter (smooth) - 2 Tbsp has 210 mg of potassium.  Pear - 1 medium (200 mg) of potassium.  Pomegranate - 1 whole fruit has 400 mg of potassium.  Pomegranate juice -  cup has 215 mg of potassium.  Pork - 3 oz has 350 mg of potassium.  Potato chips (salted) - 1 oz has 465 mg of potassium.  Potato (baked with skin) - 1 medium has 925 mg of potassium.  Potato (boiled) -  cup has 255 mg of potassium.  Potato (Mashed) -  cup has 330 mg of potassium.  Prune juice -  cup has 370 mg of potassium.  Prunes - 5 have 305 mg of potassium.  Pudding (chocolate) -  cup has 230 mg of potassium.  Pumpkin (canned) -  cup has 250 mg of potassium.  Raisins (seedless) -  cup has 270 mg of potassium.  Seeds (sunflower or pumpkin) - 1 oz has 240 mg of  potassium.  Soy milk - 1 cup has 300 mg of potassium.  Spinach (cooked) - 1/2 cup has 420 mg of potassium.  Spinach (canned) -  cup has 370 mg of potassium.  Sweet potato (baked with skin) - 1 medium has 450 mg of potassium.  Swiss chard -  cup has 480 mg of potassium.  Tomato or vegetable juice -  cup has 275 mg of potassium.  Tomato (sauce or puree) -  cup has 400-550 mg of potassium.  Tomato (raw) - 1 medium has 290 mg of potassium.  Tomato (canned) -  cup has 200-300 mg of potassium.  Turkey - 3 oz has 250 mg of potassium.  Wheat germ - 1 oz has 250 mg of potassium.  Winter squash -  cup has 250 mg of potassium.  Yogurt (plain or fruited) - 6 oz has 260-435 mg of potassium.  Zucchini -  cup has 220 mg of potassium. Moderate in potassium The following foods and beverages have 50-200 mg of potassium per serving:  Apple - 1 fruit has 150 mg of potassium  Apple juice -  cup has 150 mg of potassium  Applesauce -  cup has 90 mg of potassium  Apricot nectar -  cup has 140 mg of potassium  Asparagus (small spears) -  cup has 155 mg of potassium  Asparagus (large spears) - 6 have 155 mg of potassium  Bagel (cinnamon raisin) - 1 four-inch bagel has 130 mg of potassium  Bagel (egg or plain) - 1 four- inch bagel has 70 mg of potassium  Beans (green) -  cup has 90 mg of potassium  Beans (yellow) -  cup has 190 mg of potassium  Beer, regular - 12 oz has 100 mg of potassium  Beets (canned) -  cup has 125 mg of potassium  Blackberries -  cup has 115 mg of potassium  Blueberries -  cup has 60 mg of potassium  Bread (whole wheat) - 1 slice has 70 mg of potassium  Broccoli (raw) -  cup has 145 mg of potassium  Cabbage -  cup has 150 mg of potassium  Carrots (cooked or raw) -  cup has 180 mg of potassium  Cauliflower (raw) -  cup has 150 mg of potassium  Celery (raw) -  cup has 155 mg of potassium  Cereal, bran   flakes -  cup has 120-150 mg  of potassium  Cheese (cottage) -  cup has 110 mg of potassium  Cherries - 10 have 150 mg of potassium  Chocolate - 1 oz bar has 165 mg of potassium  Coffee (brewed) - 6 oz has 90 mg of potassium  Corn -  cup or 1 ear has 195 mg of potassium  Cucumbers -  cup has 80 mg of potassium  Egg - 1 large egg has 60 mg of potassium  Eggplant -  cup has 60 mg of potassium  Endive (raw) -  cup has 80 mg of potassium  English muffin - 1 has 65 mg of potassium  Fish (ocean perch) - 3 oz has 192 mg of potassium  Frankfurter, beef or pork - 1 has 75 mg of potassium  Fruit cocktail -  cup has 115 mg of potassium  Grape juice -  cup has 170 mg of potassium  Grapefruit -  fruit has 175 mg of potassium  Grapes -  cup has 155 mg of potassium  Greens: kale, turnip, collard -  cup has 110-150 mg of potassium  Ice cream or frozen yogurt (chocolate) -  cup has 175 mg of potassium  Ice cream or frozen yogurt (vanilla) -  cup has 120-150 mg of potassium  Lemons, limes - 1 each has 80 mg of potassium  Lettuce - 1 cup has 100 mg of potassium  Mixed vegetables -  cup has 150 mg of potassium  Mushrooms, raw -  cup has 110 mg of potassium  Nuts (walnuts, pecans, or macadamia) - 1 oz has 125 mg of potassium  Oatmeal -  cup has 80 mg of potassium  Okra -  cup has 110 mg of potassium  Onions -  cup has 120 mg of potassium  Peach - 1 has 185 mg of potassium  Peaches (canned) -  cup has 120 mg of potassium  Pears (canned) -  cup has 120 mg of potassium  Peas, green (frozen) -  cup has 90 mg of potassium  Peppers (Green) -  cup has 130 mg of potassium  Peppers (Red) -  cup has 160 mg of potassium  Pineapple juice -  cup has 165 mg of potassium  Pineapple (fresh or canned) -  cup has 100 mg of potassium  Plums - 1 has 105 mg of potassium  Pudding, vanilla -  cup has 150 mg of potassium  Raspberries -  cup has 90 mg of potassium  Rhubarb -  cup has  115 mg of potassium  Rice, wild -  cup has 80 mg of potassium  Shrimp - 3 oz has 155 mg of potassium  Spinach (raw) - 1 cup has 170 mg of potassium  Strawberries -  cup has 125 mg of potassium  Summer squash -  cup has 175-200 mg of potassium  Swiss chard (raw) - 1 cup has 135 mg of potassium  Tangerines - 1 fruit has 140 mg of potassium  Tea, brewed - 6 oz has 65 mg of potassium  Turnips -  cup has 140 mg of potassium  Watermelon -  cup has 85 mg of potassium  Wine (Red, table) - 5 oz has 180 mg of potassium  Wine (White, table) - 5 oz 100 mg of potassium Low in potassium The following foods and beverages have less than 50 mg of potassium per serving.  Bread (white) - 1 slice has 30 mg of potassium    Carbonated beverages - 12 oz has less than 5 mg of potassium  Cheese - 1 oz has 20-30 mg of potassium  Cranberries -  cup has 45 mg of potassium  Cranberry juice cocktail -  cup has 20 mg of potassium  Fats and oils - 1 Tbsp has less than 5 mg of potassium  Hummus - 1 Tbsp has 32 mg of potassium  Nectar (papaya, mango, or pear) -  cup has 35 mg of potassium  Rice (white or brown) -  cup has 50 mg of potassium  Spaghetti or macaroni (cooked) -  cup has 30 mg of potassium  Tortilla, flour or corn - 1 has 50 mg of potassium  Waffle - 1 four-inch waffle has 50 mg of potassium  Water chestnuts -  cup has 40 mg of potassium Summary  Potassium is a mineral found in many foods and drinks. It affects how the heart works, and helps keep fluids and minerals balanced in the body.  The amount of potassium you need each day depends on your age and any existing medical conditions you may have. Your health care provider or dietitian may recommend an amount of potassium that you should have each day. This information is not intended to replace advice given to you by your health care provider. Make sure you discuss any questions you have with your health care  provider. Document Revised: 03/06/2017 Document Reviewed: 06/18/2016 Elsevier Patient Education  [place] Maintenance After Age 1 After age 60, you are at a higher risk for certain long-term diseases and infections as well as injuries from falls. Falls are a major cause of broken bones and head injuries in people who are older than age 38. Getting regular preventive care can help to keep you healthy and well. Preventive care includes getting regular testing and making lifestyle changes as recommended by your health care provider. Talk with your health care provider about:  Which screenings and tests you should have. A screening is a test that checks for a disease when you have no symptoms.  A diet and exercise plan that is right for you. What should I know about screenings and tests to prevent falls? Screening and testing are the best ways to find a health problem early. Early diagnosis and treatment give you the best chance of managing medical conditions that are common after age 19. Certain conditions and lifestyle choices may make you more likely to have a fall. Your health care provider may recommend:  Regular vision checks. Poor vision and conditions such as cataracts can make you more likely to have a fall. If you wear glasses, make sure to get your prescription updated if your vision changes.  Medicine review. Work with your health care provider to regularly review all of the medicines you are taking, including over-the-counter medicines. Ask your health care provider about any side effects that may make you more likely to have a fall. Tell your health care provider if any medicines that you take make you feel dizzy or sleepy.  Osteoporosis screening. Osteoporosis is a condition that causes the bones to get weaker. This can make the bones weak and cause them to break more easily.  Blood pressure screening. Blood pressure changes and medicines to control blood pressure  can make you feel dizzy.  Strength and balance checks. Your health care provider may recommend certain tests to check your strength and balance while standing, walking, or changing positions.  Foot health exam.  Foot pain and numbness, as well as not wearing proper footwear, can make you more likely to have a fall.  Depression screening. You may be more likely to have a fall if you have a fear of falling, feel emotionally low, or feel unable to do activities that you used to do.  Alcohol use screening. Using too much alcohol can affect your balance and may make you more likely to have a fall. What actions can I take to lower my risk of falls? General instructions  Talk with your health care provider about your risks for falling. Tell your health care provider if: ? You fall. Be sure to tell your health care provider about all falls, even ones that seem minor. ? You feel dizzy, sleepy, or off-balance.  Take over-the-counter and prescription medicines only as told by your health care provider. These include any supplements.  Eat a healthy diet and maintain a healthy weight. A healthy diet includes low-fat dairy products, low-fat (lean) meats, and fiber from whole grains, beans, and lots of fruits and vegetables. Home safety  Remove any tripping hazards, such as rugs, cords, and clutter.  Install safety equipment such as grab bars in bathrooms and safety rails on stairs.  Keep rooms and walkways well-lit. Activity   Follow a regular exercise program to stay fit. This will help you maintain your balance. Ask your health care provider what types of exercise are appropriate for you.  If you need a cane or walker, use it as recommended by your health care provider.  Wear supportive shoes that have nonskid soles. Lifestyle  Do not drink alcohol if your health care provider tells you not to drink.  If you drink alcohol, limit how much you have: ? 0-1 drink a day for women. ? 0-2 drinks  a day for men.  Be aware of how much alcohol is in your drink. In the [place] one drink equals one typical bottle of beer (12 oz), one-half glass of wine (5 oz), or one shot of hard liquor (1 oz).  Do not use any products that contain nicotine or tobacco, such as cigarettes and e-cigarettes. If you need help quitting, ask your health care provider. Summary  Having a healthy lifestyle and getting preventive care can help to protect your health and wellness after age 31.  Screening and testing are the best way to find a health problem early and help you avoid having a fall. Early diagnosis and treatment give you the best chance for managing medical conditions that are more common for people who are older than age 35.  Falls are a major cause of broken bones and head injuries in people who are older than age 17. Take precautions to prevent a fall at home.  Work with your health care provider to learn what changes you can make to improve your health and wellness and to prevent falls. This information is not intended to replace advice given to you by your health care provider. Make sure you discuss any questions you have with your health care provider. Document Revised: 07/15/2018 Document Reviewed: 02/04/2017 Elsevier Patient Education  2020 [employer].

## 2019-11-25 NOTE — Progress Notes (Signed)
Name: Tammy Lozano   MRN: [id number]    DOB: 02/10/1941   Date:11/28/2019       Progress Note  Subjective  Chief Complaint  Chief Complaint  Patient presents with  . Follow-up  . Diabetes   Daughter - Stanton Kidney - is here from [place]   HPI  Diabetes type II: diagnosed October 2018 with hgbA1C of 6.6%, A1C has been at goal since, tlast visit it was A1C  6.1% .Eye exam is up to date She denies polyphagia, polydipsia or polyuria. She is obese, her weight is stable since last visit.  She has obesity and also CKI and is on ARB, urine micro last visit was  87, she continues to eat whatever she wants    Hyperlipidemia: she takes Atorvastatin, no myalgias, compliant with medication.Last HDL was lower at 41, she is trying to eat more walnuts and pecans.  She is not very physically active because of OA , uses a walker   HTN: she takeslosartan,states ace caused a cough, no chest pain or palpitation, she has not been checking her bp at home lately.   OA: left hip and kneealso has left lower back pain . She states Tylenol # 3 did not work. , she also has back pain, she does not any other medications   Morbid obesity: she states herweight as an adult was 158 lbs, after third child weight was 132 lbs and remained that way [phone] after she lost her mother, and worse since 1991 when her sister diet, she went above 200 lbs , and was gradually gaining weight but lost a couple of pounds since last visit   CKI: she used to see Dr. 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 at 49 , now on ARB, She denies pruritis and has normal urine outpu    Patient Active Problem List   Diagnosis Date Noted  . Morbid obesity ([place]) 11/04/2017  . Osteopenia of left hip 06/01/2017  . Controlled type 2 diabetes mellitus with chronic kidney disease ([place]) 11/28/2015  . CKD (chronic kidney disease) stage 3, GFR 30-59 ml/min 11/28/2015  .  Hypertension 12/04/2014  . Hyperlipidemia 12/04/2014    Past Surgical History:  Procedure Laterality Date  . COLONOSCOPY W/ POLYPECTOMY  05/16/1994  . COLONOSCOPY W/ POLYPECTOMY  12/23/2011   adenomatous polyp  . COLONOSCOPY WITH PROPOFOL N/A 08/12/2017   Procedure: COLONOSCOPY WITH PROPOFOL;  Surgeon: Manya Silvas, MD;  Location: [facility] ENDOSCOPY;  Service: Endoscopy;  Laterality: N/A;  . KNEE SURGERY Left 2010   arthroscopic    Family History  Problem Relation Age of Onset  . Cancer Mother        lung  . Cancer Father        brain  . Aneurysm Sister   . Cancer Brother        pancreatic  . Cancer Brother        esophagus  . Colon cancer Paternal Grandfather     Social History   Tobacco Use  . Smoking status: Former Smoker    Packs/day: 2.00    Years: 40.00    Pack years: 80.00    Types: Cigarettes    Start date: 1962    Quit date: 2002    Years since quitting: 19.6  . Smokeless tobacco: Never Used  . Tobacco comment: quit in 2002. Has not resumed smoking since 2002. Pt does not require smoking cessation materials.  Substance Use Topics  . Alcohol use: No    Alcohol/week: 0.0 standard drinks     Current Outpatient Medications:  .  atorvastatin (LIPITOR) 40 MG tablet, Take 1 tablet (40 mg total) by mouth daily., Disp: 90 tablet, Rfl: 1 .  Cholecalciferol (VITAMIN D-3) 1000 units CAPS, Take 1 capsule (1,000 Units total) by mouth daily., Disp: 30 capsule, Rfl: 0 .  losartan (COZAAR) 100 MG tablet, Take 1 tablet (100 mg total) by mouth daily., Disp: 90 tablet, Rfl: 1 .  Omega-3 Fatty Acids (FISH OIL) 1000 MG CAPS, Take 1,000 mg by mouth daily. , Disp: , Rfl:   Allergies  Allergen Reactions  . Ace Inhibitors     cough    I personally reviewed active problem list, medication list, allergies, family history, social history, health maintenance with the patient/caregiver today.  Constitutional: Negative for fever or weight change.  Respiratory: Negative for  cough and shortness of breath.   Cardiovascular: Negative for chest pain or palpitations.  Gastrointestinal: Negative for abdominal pain, no bowel changes.  Musculoskeletal: positive for gait problem but no  joint swelling.  Skin: Negative for rash.  Neurological: Negative for dizziness or headache.  No other specific complaints in a complete review of systems (except as listed in HPI above).   Objective  Vitals:   11/28/19 0956  BP: 138/84  Resp: 18  Temp: 98.1 F (36.7 C)  TempSrc: Oral  SpO2: 95%  Weight: 226 lb 6.4 oz (102.7 kg)  Height: 5\' 4"  (1.626 m)    Body mass index is 38.86 kg/m.  Constitutional: Patient appears well-developed and well-nourished. Obese  No distress.  HEENT: head atraumatic, normocephalic, pupils equal and reactive to light, neck supple Cardiovascular: Normal rate, regular rhythm and normal heart sounds.  No murmur heard. No BLE edema. Pulmonary/Chest: Effort normal and breath sounds normal. No respiratory distress. Abdominal: Soft.  There is no tenderness. Muscular Skeletal: using a walker, walks slowly  Psychiatric: Patient has a normal mood and affect. behavior is normal. Judgment and thought content normal.    PHQ2/9: Depression screen [facility] 2/9 11/28/2019 11/11/2019 05/31/2019 04/22/2019 02/28/2019  Decreased Interest 0 0 0 0 0  Down, Depressed, Hopeless 0 0 0 0 0  PHQ - 2 Score 0 0 0 0 0  Altered sleeping 0 - 0 - 0  Tired, decreased energy 0 - 0 - 0  Change in appetite 0 - 0 - 0  Feeling bad or failure about yourself  0 - 0 - 0  Trouble concentrating 0 - 0 - 0  Moving slowly or fidgety/restless 0 - 0 - 0  Suicidal thoughts 0 - 0 - 0  PHQ-9 Score 0 - 0 - 0  Difficult doing work/chores - - - - -  Some recent data might be hidden    phq 9 is negative   Fall Risk: Fall Risk  11/28/2019 11/11/2019 05/31/2019 04/22/2019 02/28/2019  Falls in the past year? 0 0 0 0 0  Number falls in past yr: 0 - 0 0 0  Injury with Fall? 0 - 0 0 0  Risk for  fall due to : Impaired balance/gait - - Impaired balance/gait -  Risk for fall due to: Comment - - - - -  Follow up - - - Falls prevention discussed -     Functional Status Survey: Is the patient deaf or have difficulty hearing?: No Does the patient have difficulty seeing, even when wearing glasses/contacts?: No Does the patient have difficulty concentrating,  remembering, or making decisions?: No Does the patient have difficulty walking or climbing stairs?: Yes Does the patient have difficulty dressing or bathing?: No Does the patient have difficulty doing errands alone such as visiting a doctor's office or shopping?: No    Assessment & Plan  1. Controlled type 2 diabetes mellitus with stage 3 chronic kidney disease, without long-term current use of insulin (HCC)  - losartan (COZAAR) 100 MG tablet; Take 1 tablet (100 mg total) by mouth daily.  Dispense: 90 tablet; Refill: 1  2. Diabetes mellitus with microalbuminuria (HCC)  - losartan (COZAAR) 100 MG tablet; Take 1 tablet (100 mg total) by mouth daily.  Dispense: 90 tablet; Refill: 1 - Hemoglobin A1c - Comprehensive metabolic panel  3. Dyslipidemia associated with type 2 diabetes mellitus (HCC)  - Lipid panel  4. Essential hypertension  - losartan (COZAAR) 100 MG tablet; Take 1 tablet (100 mg total) by mouth daily.  Dispense: 90 tablet; Refill: 1 - Comprehensive metabolic panel - CBC  5. Vitamin D deficiency   6. Primary osteoarthritis of left knee  Discussed tylenol only, avoid nsaid's   7. Chronic left-sided low back pain without sciatica  stable

## 2019-11-28 ENCOUNTER — Encounter: Payer: Self-pay | Admitting: Family Medicine

## 2019-11-28 ENCOUNTER — Other Ambulatory Visit: Payer: Self-pay

## 2019-11-28 ENCOUNTER — Ambulatory Visit (INDEPENDENT_AMBULATORY_CARE_PROVIDER_SITE_OTHER): Payer: Medicare Other | Admitting: Family Medicine

## 2019-11-28 VITALS — BP 132/84 | Temp 98.1°F | Resp 18 | Ht 64.0 in | Wt 226.4 lb

## 2019-11-28 DIAGNOSIS — I10 Essential (primary) hypertension: Secondary | ICD-10-CM

## 2019-11-28 DIAGNOSIS — G8929 Other chronic pain: Secondary | ICD-10-CM

## 2019-11-28 DIAGNOSIS — E559 Vitamin D deficiency, unspecified: Secondary | ICD-10-CM

## 2019-11-28 DIAGNOSIS — M1712 Unilateral primary osteoarthritis, left knee: Secondary | ICD-10-CM

## 2019-11-28 DIAGNOSIS — R809 Proteinuria, unspecified: Secondary | ICD-10-CM

## 2019-11-28 DIAGNOSIS — E1122 Type 2 diabetes mellitus with diabetic chronic kidney disease: Secondary | ICD-10-CM

## 2019-11-28 DIAGNOSIS — M545 Low back pain, unspecified: Secondary | ICD-10-CM

## 2019-11-28 DIAGNOSIS — E1169 Type 2 diabetes mellitus with other specified complication: Secondary | ICD-10-CM

## 2019-11-28 DIAGNOSIS — E1129 Type 2 diabetes mellitus with other diabetic kidney complication: Secondary | ICD-10-CM | POA: Diagnosis not present

## 2019-11-28 DIAGNOSIS — N183 Chronic kidney disease, stage 3 unspecified: Secondary | ICD-10-CM

## 2019-11-28 DIAGNOSIS — E785 Hyperlipidemia, unspecified: Secondary | ICD-10-CM

## 2019-11-28 MED ORDER — LOSARTAN POTASSIUM 100 MG PO TABS: 100.0000 mg | ORAL_TABLET | Freq: Every day | ORAL | 1 refills | Status: DC

## 2019-11-29 LAB — COMPREHENSIVE METABOLIC PANEL
AG Ratio: 1.4 (calc) (ref 1.0–2.5)
ALT: 15 U/L (ref 6–29)
AST: 15 U/L (ref 10–35)
Albumin: 3.9 g/dL (ref 3.6–5.1)
Alkaline phosphatase (APISO): 83 U/L (ref 37–153)
BUN/Creatinine Ratio: 12 (calc) (ref 6–22)
BUN: 14 mg/dL (ref 7–25)
CO2: 25 mmol/L (ref 20–32)
Calcium: 9 mg/dL (ref 8.6–10.4)
Chloride: 107 mmol/L (ref 98–110)
Creat: 1.17 mg/dL — ABNORMAL HIGH (ref 0.60–0.88)
Globulin: 2.8 g/dL (calc) (ref 1.9–3.7)
Glucose, Bld: 110 mg/dL — ABNORMAL HIGH (ref 65–99)
Potassium: 4.1 mmol/L (ref 3.5–5.3)
Sodium: 143 mmol/L (ref 135–146)
Total Bilirubin: 1 mg/dL (ref 0.2–1.2)
Total Protein: 6.7 g/dL (ref 6.1–8.1)

## 2019-11-29 LAB — CBC
HCT: 37.9 % (ref 35.0–45.0)
Hemoglobin: 12.2 g/dL (ref 11.7–15.5)
MCH: 28.9 pg (ref 27.0–33.0)
MCHC: 32.2 g/dL (ref 32.0–36.0)
MCV: 89.8 fL (ref 80.0–100.0)
MPV: 9.9 fL (ref 7.5–12.5)
Platelets: 286 10*3/uL (ref 140–400)
RBC: 4.22 10*6/uL (ref 3.80–5.10)
RDW: 13.7 % (ref 11.0–15.0)
WBC: 6.1 10*3/uL (ref 3.8–10.8)

## 2020-01-23 ENCOUNTER — Other Ambulatory Visit: Payer: Self-pay | Admitting: Family Medicine

## 2020-01-23 DIAGNOSIS — E1129 Type 2 diabetes mellitus with other diabetic kidney complication: Secondary | ICD-10-CM

## 2020-01-23 DIAGNOSIS — R809 Proteinuria, unspecified: Secondary | ICD-10-CM

## 2020-01-23 DIAGNOSIS — E1122 Type 2 diabetes mellitus with diabetic chronic kidney disease: Secondary | ICD-10-CM

## 2020-01-23 DIAGNOSIS — N183 Chronic kidney disease, stage 3 unspecified: Secondary | ICD-10-CM

## 2020-01-23 DIAGNOSIS — I10 Essential (primary) hypertension: Secondary | ICD-10-CM

## 2020-01-24 ENCOUNTER — Other Ambulatory Visit: Payer: Self-pay | Admitting: Family Medicine

## 2020-01-24 DIAGNOSIS — I10 Essential (primary) hypertension: Secondary | ICD-10-CM

## 2020-01-24 DIAGNOSIS — N183 Chronic kidney disease, stage 3 unspecified: Secondary | ICD-10-CM

## 2020-01-24 DIAGNOSIS — R809 Proteinuria, unspecified: Secondary | ICD-10-CM

## 2020-01-24 DIAGNOSIS — E1122 Type 2 diabetes mellitus with diabetic chronic kidney disease: Secondary | ICD-10-CM

## 2020-01-24 DIAGNOSIS — E1129 Type 2 diabetes mellitus with other diabetic kidney complication: Secondary | ICD-10-CM

## 2020-03-19 ENCOUNTER — Other Ambulatory Visit: Payer: Self-pay | Admitting: Family Medicine

## 2020-03-19 DIAGNOSIS — E1169 Type 2 diabetes mellitus with other specified complication: Secondary | ICD-10-CM

## 2020-03-19 DIAGNOSIS — E785 Hyperlipidemia, unspecified: Secondary | ICD-10-CM

## 2020-03-20 ENCOUNTER — Other Ambulatory Visit: Payer: Self-pay | Admitting: Family Medicine

## 2020-03-20 DIAGNOSIS — E785 Hyperlipidemia, unspecified: Secondary | ICD-10-CM

## 2020-03-20 DIAGNOSIS — E1169 Type 2 diabetes mellitus with other specified complication: Secondary | ICD-10-CM

## 2020-04-26 ENCOUNTER — Ambulatory Visit: Payer: Medicare Other

## 2020-05-22 ENCOUNTER — Other Ambulatory Visit: Payer: Self-pay

## 2020-05-22 ENCOUNTER — Ambulatory Visit (INDEPENDENT_AMBULATORY_CARE_PROVIDER_SITE_OTHER): Payer: Medicare Other

## 2020-05-22 VITALS — BP 122/82 | HR 69 | Temp 98.2°F | Resp 16 | Ht 64.0 in | Wt 220.1 lb

## 2020-05-22 DIAGNOSIS — Z0000 Encounter for general adult medical examination without abnormal findings: Secondary | ICD-10-CM

## 2020-05-22 NOTE — Patient Instructions (Signed)
Ms. Vavrek , Thank you for taking time to come for your Medicare Wellness Visit. I appreciate your ongoing commitment to your health goals. Please review the following plan we discussed and let me know if I can assist you in the future.   Screening recommendations/referrals: Colonoscopy: done 08/12/17 Mammogram: no longer required Bone Density: no longer required Recommended yearly ophthalmology/optometry visit for glaucoma screening and checkup Recommended yearly dental visit for hygiene and checkup  Vaccinations: Influenza vaccine: declined Pneumococcal vaccine: done 02/28/19 Tdap vaccine: done 01/24/14 Shingles vaccine: Shingrix discussed. Please contact your pharmacy for coverage information.  Covid-19:done 06/23/19, 07/19/19 7 03/09/20  Advanced directives: Advance directive discussed with you today. Even though you declined this today please call our office should you change your mind and we can give you the proper paperwork for you to fill out.  Conditions/risks identified: Recommend continuing fall prevention in the home.   Next appointment: Follow up in one year for your annual wellness visit    Preventive Care 65 Years and Older, Female Preventive care refers to lifestyle choices and visits with your health care provider that can promote health and wellness. What does preventive care include?  A yearly physical exam. This is also called an annual well check.  Dental exams once or twice a year.  Routine eye exams. Ask your health care provider how often you should have your eyes checked.  Personal lifestyle choices, including:  Daily care of your teeth and gums.  Regular physical activity.  Eating a healthy diet.  Avoiding tobacco and drug use.  Limiting alcohol use.  Practicing safe sex.  Taking low-dose aspirin every day.  Taking vitamin and mineral supplements as recommended by your health care provider. What happens during an annual well check? The services  and screenings done by your health care provider during your annual well check will depend on your age, overall health, lifestyle risk factors, and family history of disease. Counseling  Your health care provider may ask you questions about your:  Alcohol use.  Tobacco use.  Drug use.  Emotional well-being.  Home and relationship well-being.  Sexual activity.  Eating habits.  History of falls.  Memory and ability to understand (cognition).  Work and work Statistician.  Reproductive health. Screening  You may have the following tests or measurements:  Height, weight, and BMI.  Blood pressure.  Lipid and cholesterol levels. These may be checked every 5 years, or more frequently if you are over 38 years old.  Skin check.  Lung cancer screening. You may have this screening every year starting at age 48 if you have a 30-pack-year history of smoking and currently smoke or have quit within the past 15 years.  Fecal occult blood test (FOBT) of the stool. You may have this test every year starting at age 61.  Flexible sigmoidoscopy or colonoscopy. You may have a sigmoidoscopy every 5 years or a colonoscopy every 10 years starting at age 42.  Hepatitis C blood test.  Hepatitis B blood test.  Sexually transmitted disease (STD) testing.  Diabetes screening. This is done by checking your blood sugar (glucose) after you have not eaten for a while (fasting). You may have this done every 1-3 years.  Bone density scan. This is done to screen for osteoporosis. You may have this done starting at age 14.  Mammogram. This may be done every 1-2 years. Talk to your health care provider about how often you should have regular mammograms. Talk with your health care provider  about your test results, treatment options, and if necessary, the need for more tests. Vaccines  Your health care provider may recommend certain vaccines, such as:  Influenza vaccine. This is recommended every  year.  Tetanus, diphtheria, and acellular pertussis (Tdap, Td) vaccine. You may need a Td booster every 10 years.  Zoster vaccine. You may need this after age 58.  Pneumococcal 13-valent conjugate (PCV13) vaccine. One dose is recommended after age 75.  Pneumococcal polysaccharide (PPSV23) vaccine. One dose is recommended after age 67. Talk to your health care provider about which screenings and vaccines you need and how often you need them. This information is not intended to replace advice given to you by your health care provider. Make sure you discuss any questions you have with your health care provider. Document Released: 04/20/2015 Document Revised: 12/12/2015 Document Reviewed: 01/23/2015 Elsevier Interactive Patient Education  2017 [employer] Prevention in the Home Falls can cause injuries. They can happen to people of all ages. There are many things you can do to make your home safe and to help prevent falls. What can I do on the outside of my home?  Regularly fix the edges of walkways and driveways and fix any cracks.  Remove anything that might make you trip as you walk through a door, such as a raised step or threshold.  Trim any bushes or trees on the path to your home.  Use bright outdoor lighting.  Clear any walking paths of anything that might make someone trip, such as rocks or tools.  Regularly check to see if handrails are loose or broken. Make sure that both sides of any steps have handrails.  Any raised decks and porches should have guardrails on the edges.  Have any leaves, snow, or ice cleared regularly.  Use sand or salt on walking paths during winter.  Clean up any spills in your garage right away. This includes oil or grease spills. What can I do in the bathroom?  Use night lights.  Install grab bars by the toilet and in the tub and shower. Do not use towel bars as grab bars.  Use non-skid mats or decals in the tub or shower.  If you  need to sit down in the shower, use a plastic, non-slip stool.  Keep the floor dry. Clean up any water that spills on the floor as soon as it happens.  Remove soap buildup in the tub or shower regularly.  Attach bath mats securely with double-sided non-slip rug tape.  Do not have throw rugs and other things on the floor that can make you trip. What can I do in the bedroom?  Use night lights.  Make sure that you have a light by your bed that is easy to reach.  Do not use any sheets or blankets that are too big for your bed. They should not hang down onto the floor.  Have a firm chair that has side arms. You can use this for support while you get dressed.  Do not have throw rugs and other things on the floor that can make you trip. What can I do in the kitchen?  Clean up any spills right away.  Avoid walking on wet floors.  Keep items that you use a lot in easy-to-reach places.  If you need to reach something above you, use a strong step stool that has a grab bar.  Keep electrical cords out of the way.  Do not use floor polish or  wax that makes floors slippery. If you must use wax, use non-skid floor wax.  Do not have throw rugs and other things on the floor that can make you trip. What can I do with my stairs?  Do not leave any items on the stairs.  Make sure that there are handrails on both sides of the stairs and use them. Fix handrails that are broken or loose. Make sure that handrails are as long as the stairways.  Check any carpeting to make sure that it is firmly attached to the stairs. Fix any carpet that is loose or worn.  Avoid having throw rugs at the top or bottom of the stairs. If you do have throw rugs, attach them to the floor with carpet tape.  Make sure that you have a light switch at the top of the stairs and the bottom of the stairs. If you do not have them, ask someone to add them for you. What else can I do to help prevent falls?  Wear shoes  that:  Do not have high heels.  Have rubber bottoms.  Are comfortable and fit you well.  Are closed at the toe. Do not wear sandals.  If you use a stepladder:  Make sure that it is fully opened. Do not climb a closed stepladder.  Make sure that both sides of the stepladder are locked into place.  Ask someone to hold it for you, if possible.  Clearly mark and make sure that you can see:  Any grab bars or handrails.  First and last steps.  Where the edge of each step is.  Use tools that help you move around (mobility aids) if they are needed. These include:  Canes.  Walkers.  Scooters.  Crutches.  Turn on the lights when you go into a dark area. Replace any light bulbs as soon as they burn out.  Set up your furniture so you have a clear path. Avoid moving your furniture around.  If any of your floors are uneven, fix them.  If there are any pets around you, be aware of where they are.  Review your medicines with your doctor. Some medicines can make you feel dizzy. This can increase your chance of falling. Ask your doctor what other things that you can do to help prevent falls. This information is not intended to replace advice given to you by your health care provider. Make sure you discuss any questions you have with your health care provider. Document Released: 01/18/2009 Document Revised: 08/30/2015 Document Reviewed: 04/28/2014 Elsevier Interactive Patient Education  2017 [employer].

## 2020-05-22 NOTE — Progress Notes (Signed)
Subjective:   Tammy Lozano is a 80 y.o. female who presents for Medicare Annual (Subsequent) preventive examination.  Review of Systems     Cardiac Risk Factors include: advanced age (>61men, >44 women);dyslipidemia;diabetes mellitus;hypertension;obesity (BMI >30kg/m2)     Objective:    Today's Vitals   05/22/20 1016 05/22/20 1019  BP: 122/82   Pulse: 69   Resp: 16   Temp: 98.2 F (36.8 C)   TempSrc: Oral   SpO2: 96%   Weight: 220 lb 1.6 oz (99.8 kg)   Height: 5\' 4"  (1.626 m)   PainSc:  4    Body mass index is 37.78 kg/m.  Advanced Directives 05/22/2020 04/22/2019 03/29/2018 03/23/2018 12/26/2017 08/12/2017 03/20/2017  Does Patient Have a Medical Advance Directive? No No No No No No Yes  Type of Advance Directive - - - - - - Press photographer;Living will  Copy of [place] in Chart? - - - - - - No - copy requested  Would patient like information on creating a medical advance directive? No - Patient declined No - Patient declined No - Patient declined Yes (MAU/Ambulatory/Procedural Areas - Information given) - Yes (MAU/Ambulatory/Procedural Areas - Information given) -    Current Medications (verified) Outpatient Encounter Medications as of 05/22/2020  Medication Sig  . atorvastatin (LIPITOR) 40 MG tablet Take 1 tablet by mouth once daily  . Cholecalciferol (VITAMIN D-3) 1000 units CAPS Take 1 capsule (1,000 Units total) by mouth daily.  [name] losartan (COZAAR) 100 MG tablet Take 1 tablet by mouth once daily  . Omega-3 Fatty Acids (FISH OIL) 1000 MG CAPS Take 1,000 mg by mouth daily.    No facility-administered encounter medications on file as of 05/22/2020.    Allergies (verified) Ace inhibitors   History: Past Medical History:  Diagnosis Date  . Chronic kidney disease    stage 3  . Diabetes mellitus without complication ([place])   . Hyperlipidemia   . Hypertension   . Osteoarthritis    Past Surgical History:  Procedure Laterality Date   . COLONOSCOPY W/ POLYPECTOMY  05/16/1994  . COLONOSCOPY W/ POLYPECTOMY  12/23/2011   adenomatous polyp  . COLONOSCOPY WITH PROPOFOL N/A 08/12/2017   Procedure: COLONOSCOPY WITH PROPOFOL;  Surgeon: Manya Silvas, MD;  Location: [facility] ENDOSCOPY;  Service: Endoscopy;  Laterality: N/A;  . KNEE SURGERY Left 2010   arthroscopic   Family History  Problem Relation Age of Onset  . Cancer Mother        lung  . Cancer Father        brain  . Aneurysm Sister   . Cancer Brother        pancreatic  . Cancer Brother        esophagus  . Colon cancer Paternal Grandfather    Social History   Socioeconomic History  . Marital status: Divorced    Spouse name: Not on file  . Number of children: 3  . Years of education: 75  . Highest education level: 12th grade  Occupational History    Employer: RETIRED  Tobacco Use  . Smoking status: Former Smoker    Packs/day: 2.00    Years: 40.00    Pack years: 80.00    Types: Cigarettes    Start date: 1962    Quit date: 2002    Years since quitting: 20.1  . Smokeless tobacco: Never Used  . Tobacco comment: quit in 2002. Has not resumed smoking since 2002. Pt does not require smoking  cessation materials.  Vaping Use  . Vaping Use: Never used  Substance and Sexual Activity  . Alcohol use: No    Alcohol/week: 0.0 standard drinks  . Drug use: No  . Sexual activity: Not Currently  Other Topics Concern  . Not on file  Social History Narrative   Lives with grandson , his wife and 3 great-grandchildren    Social Determinants of Health   Financial Resource Strain: Low Risk   . Difficulty of Paying Living Expenses: Not hard at all  Food Insecurity: No Food Insecurity  . Worried About Charity fundraiser in the Last Year: Never true  . Ran Out of Food in the Last Year: Never true  Transportation Needs: No Transportation Needs  . Lack of Transportation (Medical): No  . Lack of Transportation (Non-Medical): No  Physical Activity: Inactive  . Days of  Exercise per Week: 0 days  . Minutes of Exercise per Session: 0 min  Stress: No Stress Concern Present  . Feeling of Stress : Not at all  Social Connections: Socially Isolated  . Frequency of Communication with Friends and Family: More than three times a week  . Frequency of Social Gatherings with Friends and Family: More than three times a week  . Attends Religious Services: Never  . Active Member of Clubs or Organizations: No  . Attends Archivist Meetings: Never  . Marital Status: Divorced    Tobacco Counseling Counseling given: Not Answered Comment: quit in 2002. Has not resumed smoking since 2002. Pt does not require smoking cessation materials.   Clinical Intake:  Pre-visit preparation completed: Yes  Pain : 0-10 Pain Score: 4  Pain Type: Chronic pain Pain Location: Hip (knees) Pain Orientation: Right,Left Pain Descriptors / Indicators: Aching,Sore Pain Onset: More than a month ago Pain Frequency: Constant     BMI - recorded: 37.78 Nutritional Status: BMI > 30  Obese Nutritional Risks: None Diabetes: Yes CBG done?: No Did pt. bring in CBG monitor from home?: No  How often do you need to have someone help you when you read instructions, pamphlets, or other written materials from your doctor or pharmacy?: 1 - Never  Nutrition Risk Assessment:  Has the patient had any N/V/D within the last 2 months?  No  Does the patient have any non-healing wounds?  No  Has the patient had any unintentional weight loss or weight gain?  No   Diabetes:  Is the patient diabetic?  Yes  If diabetic, was a CBG obtained today?  No  Did the patient bring in their glucometer from home?  No  How often do you monitor your CBG's? Pt does not actively check blood sugar.   Financial Strains and Diabetes Management:  Are you having any financial strains with the device, your supplies or your medication? No .  Does the patient want to be seen by Chronic Care Management for  management of their diabetes?  No  Would the patient like to be referred to a Nutritionist or for Diabetic Management?  No   Diabetic Exams:  Diabetic Eye Exam: Completed 01/07/19. Overdue for diabetic eye exam. Pt has been advised about the importance in completing this exam.  Diabetic Foot Exam: Completed 02/28/19. Pt has been advised about the importance in completing this exam. Pt is scheduled for diabetic foot exam on 05/30/20.    Interpreter Needed?: No  Information entered by :: Clemetine Marker LPN   Activities of Daily Living In your present state of health,  do you have any difficulty performing the following activities: 05/22/2020 11/28/2019  Hearing? N N  Comment declines hearing aids -  Vision? N N  Difficulty concentrating or making decisions? N N  Walking or climbing stairs? Y Y  Comment - -  Dressing or bathing? N N  Doing errands, shopping? N N  Preparing Food and eating ? N -  Using the Toilet? N -  In the past six months, have you accidently leaked urine? N -  Do you have problems with loss of bowel control? N -  Managing your Medications? N -  Managing your Finances? N -  Housekeeping or managing your Housekeeping? N -  Some recent data might be hidden    Patient Care Team: Steele Sizer, MD as PCP - General (Family Medicine)  Indicate any recent Medical Services you may have received from other than [facility] providers in the past year (date may be approximate).     Assessment:   This is a routine wellness examination for [place].  Hearing/Vision screen  Hearing Screening   125Hz  250Hz  500Hz  1000Hz  2000Hz  3000Hz  4000Hz  6000Hz  8000Hz   Right ear:           Left ear:           Comments: Pt denied hearing difficulty   Vision Screening Comments: Annual vision screenings done by [facility]  Dietary issues and exercise activities discussed: Current Exercise Habits: The patient does not participate in regular exercise at present, Exercise limited by:  orthopedic condition(s)  Goals    . Cut out extra servings     Continue to eliminate large meals and snacks prior to bedtime    . diet     Continue to eliminate large meals and snacks prior to bedtime       Depression Screen PHQ 2/9 Scores 05/22/2020 11/28/2019 11/11/2019 05/31/2019 04/22/2019 02/28/2019 02/23/2019  PHQ - 2 Score 0 0 0 0 0 0 0  PHQ- 9 Score - 0 - 0 - 0 -    Fall Risk Fall Risk  05/22/2020 11/28/2019 11/11/2019 05/31/2019 04/22/2019  Falls in the past year? 0 0 0 0 0  Number falls in past yr: 0 0 - 0 0  Injury with Fall? 0 0 - 0 0  Risk for fall due to : Impaired balance/gait Impaired balance/gait - - Impaired balance/gait  Risk for fall due to: Comment - - - - -  Follow up Falls prevention discussed - - - Falls prevention discussed    FALL RISK PREVENTION PERTAINING TO THE HOME:  Any stairs in or around the home? Yes  If so, are there any without handrails? No  Home free of loose throw rugs in walkways, pet beds, electrical cords, etc? Yes  Adequate lighting in your home to reduce risk of falls? Yes   ASSISTIVE DEVICES UTILIZED TO PREVENT FALLS:  Life alert? No  Use of a cane, walker or w/c? Yes  Grab bars in the bathroom? No  Shower chair or bench in shower? Yes  Elevated toilet seat or a handicapped toilet? No   TIMED UP AND GO:  Was the test performed? Yes .  Length of time to ambulate 10 feet: 8 sec.   Gait slow and steady with assistive device  Cognitive Function: Normal cognitive status assessed by direct observation by this Nurse Health Advisor. No abnormalities found.       6CIT Screen 04/22/2019 03/23/2018 03/20/2017 03/17/2016  What Year? 0 points 0 points 0 points 0  points  What month? 0 points 0 points 0 points 0 points  What time? 0 points 0 points 0 points 0 points  Count back from 20 0 points 0 points 0 points 0 points  Months in reverse 0 points 0 points 0 points 0 points  Repeat phrase 0 points 0 points 2 points 0 points  Total Score 0  0 2 0    Immunizations Immunization History  Administered Date(s) Administered  . PFIZER(Purple Top)SARS-COV-2 Vaccination 06/23/2019, 07/19/2019, 03/09/2020  . Pneumococcal Conjugate-13 02/28/2019  . Pneumococcal Polysaccharide-23 11/04/2017  . Tdap 01/24/2014    TDAP status: Up to date  Flu Vaccine status: Declined, Education has been provided regarding the importance of this vaccine but patient still declined. Advised may receive this vaccine at local pharmacy or Health Dept. Aware to provide a copy of the vaccination record if obtained from local pharmacy or Health Dept. Verbalized acceptance and understanding.  Pneumococcal vaccine status: Up to date  Covid-19 vaccine status: Completed vaccines  Qualifies for Shingles Vaccine? Yes   Zostavax completed No   Shingrix Completed?: No.    Education has been provided regarding the importance of this vaccine. Patient has been advised to call insurance company to determine out of pocket expense if they have not yet received this vaccine. Advised may also receive vaccine at local pharmacy or Health Dept. Verbalized acceptance and understanding.  Screening Tests Health Maintenance  Topic Date Due  . HEMOGLOBIN A1C  11/28/2019  . OPHTHALMOLOGY EXAM  01/07/2020  . FOOT EXAM  02/28/2020  . INFLUENZA VACCINE  07/05/2020 (Originally 11/06/2019)  . MAMMOGRAM  05/22/2021 (Originally 05/09/1958)  . DEXA SCAN  05/22/2021 (Originally 05/09/2004)  . COLONOSCOPY (Pts 45-35yrs Insurance coverage will need to be confirmed)  08/13/2022  . TETANUS/TDAP  01/25/2024  . COVID-19 Vaccine  Completed  . PNA vac Low Risk Adult  Completed    Health Maintenance  Health Maintenance Due  Topic Date Due  . HEMOGLOBIN A1C  11/28/2019  . OPHTHALMOLOGY EXAM  01/07/2020  . FOOT EXAM  02/28/2020    Colorectal cancer screening: Type of screening: Colonoscopy. Completed 08/12/17. Repeat every 5 years  Mammogram status: No longer required due to age.  Bone  density status: no longer required due to age.   Lung Cancer Screening: (Low Dose CT Chest recommended if Age 48-80 years, 30 pack-year currently smoking OR have quit w/in 15years.) does not qualify.    Additional Screening:  Hepatitis C Screening: does not qualify.  Vision Screening: Recommended annual ophthalmology exams for early detection of glaucoma and other disorders of the eye. Is the patient up to date with their annual eye exam?  No  Who is the provider or what is the name of the office in which the patient attends annual eye exams? [place] Screening: Recommended annual dental exams for proper oral hygiene  Community Resource Referral / Chronic Care Management: CRR required this visit?  No   CCM required this visit?  No      Plan:     I have personally reviewed and noted the following in the patient's chart:   . Medical and social history . Use of alcohol, tobacco or illicit drugs  . Current medications and supplements . Functional ability and status . Nutritional status . Physical activity . Advanced directives . List of other physicians . Hospitalizations, surgeries, and ER visits in previous 12 months . Vitals . Screenings to include cognitive, depression, and falls . Referrals and appointments  In addition, I have reviewed and discussed with patient certain preventive protocols, quality metrics, and best practice recommendations. A written personalized care plan for preventive services as well as general preventive health recommendations were provided to patient.     Clemetine Marker, LPN   [id number]   Nurse Notes: pt scheduled for follow up next week with Dr. Ancil Boozer. Per patient she is in need of a new walker as hers in approx 81 years old and right wheel gets stuck at times. Pt advised [employer] in network with [employer]. Pt plans to discuss at appt next week to request order for new walker. Pt also advised due for labs and  needs to be fasting prior to appt next week.

## 2020-05-24 DIAGNOSIS — H2513 Age-related nuclear cataract, bilateral: Secondary | ICD-10-CM | POA: Diagnosis not present

## 2020-05-29 NOTE — Progress Notes (Signed)
Name: Tammy Lozano   MRN: [id number]    DOB: 09-23-1940   Date:05/30/2020       Progress Note  Subjective  Chief Complaint  Follow Up  HPI  Diabetes type II: diagnosed October 2018 with hgbA1C of 6.6%, A1C has been at goal since, today it is down to 5.9 % She denies polyphagia, polydipsia or polyuria. She is obese, her weight is stable since last visit.  She has obesity and also CKI and is on ARB, urine micro last visit was  73 . She has been avoiding junk food, no longer eating late at night and has lost a couple of pounds   Hyperlipidemia: she takes Atorvastatin, no myalgias, compliant with medication.Last HDL was lower at 41, she is trying to eat more walnuts and pecans.  She is not very physically active because of OA . We will recheck labs today   HTN: she takeslosartan,states ACE caused a cough, no chest pain or palpitation, bp is at goal. Continue current medication   OA: left hip and knee also has left lower back pain .She takes prn medication otc , nothing a regular basis, stable.   Morbid obesity: she states herweight as an adult was 158 lbs, after third child weight was 132 lbs and remained that way [phone] after she lost her mother, and worse since 1991 when her sister diet, she went above 200 lbs , and was gradually gaining weight but lost a couple of pounds since last visit   CKI: she used to see Dr. 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 Morbid obesity ([place]) 11/04/2017  . Osteopenia of left hip 06/01/2017  . Controlled type 2 diabetes mellitus with chronic kidney disease ([place]) 11/28/2015  . CKD (chronic kidney disease) stage 3, GFR 30-59 ml/min ([facility]) 11/28/2015  . Hypertension 12/04/2014  . Hyperlipidemia 12/04/2014    Past  Surgical History:  Procedure Laterality Date  . COLONOSCOPY W/ POLYPECTOMY  05/16/1994  . COLONOSCOPY W/ POLYPECTOMY  12/23/2011   adenomatous polyp  . COLONOSCOPY WITH PROPOFOL N/A 08/12/2017   Procedure: COLONOSCOPY WITH PROPOFOL;  Surgeon: Manya Silvas, MD;  [facility] ENDOSCOPY;  Service: Endoscopy;  Laterality: N/A;  . KNEE SURGERY Left 2010   arthroscopic    Family History  Problem Relation Age of Onset  . Cancer Mother        lung  . Cancer Father        brain  . Aneurysm Sister   . Cancer Brother        pancreatic  . Cancer Brother        esophagus  . Colon cancer Paternal Grandfather     Social History   Tobacco Use  . Smoking status: Former Smoker    Packs/day: 2.00    Years: 40.00    Pack years: 80.00    Types: Cigarettes    Start date: 1962    Quit date: 2002    Years since quitting: 20.1  . Smokeless tobacco: Never Used  . Tobacco comment: quit in 2002. Has not resumed smoking since 2002. Pt does not require smoking cessation materials.  Substance Use Topics  . Alcohol use: No    Alcohol/week: 0.0 standard drinks  Current Outpatient Medications:  .  atorvastatin (LIPITOR) 40 MG tablet, Take 1 tablet by mouth once daily, Disp: 90 tablet, Rfl: 0 .  Cholecalciferol (VITAMIN D-3) 1000 units CAPS, Take 1 capsule (1,000 Units total) by mouth daily., Disp: 30 capsule, Rfl: 0 .  losartan (COZAAR) 100 MG tablet, Take 1 tablet by mouth once daily, Disp: 90 tablet, Rfl: 0 .  Omega-3 Fatty Acids (FISH OIL) 1000 MG CAPS, Take 1,000 mg by mouth daily. , Disp: , Rfl:   Allergies  Allergen Reactions  . Ace Inhibitors     cough    I personally reviewed active problem list, medication list, allergies, family history, social history, health maintenance with the patient/caregiver today.   ROS  Constitutional: Negative for fever or weight change.  Respiratory: Negative for cough and shortness of breath.   Cardiovascular: Negative for chest pain or  palpitations.  Gastrointestinal: Negative for abdominal pain, no bowel changes.  Musculoskeletal: positive for gait problem but no joint swelling.  Skin: Negative for rash.  Neurological: Negative for dizziness or headache.  No other specific complaints in a complete review of systems (except as listed in HPI above).  Objective  Vitals:   05/30/20 1023  BP: 138/74  Pulse: 91  Resp: 16  Temp: 98.4 F (36.9 C)  TempSrc: Oral  SpO2: 100%  Weight: 218 lb (98.9 kg)  Height: 5\' 4"  (1.626 m)    Body mass index is 37.42 kg/m.  Physical Exam  Constitutional: Patient appears well-developed and well-nourished. Obese No distress.  HEENT: head atraumatic, normocephalic, pupils equal and reactive to light, neck supple Cardiovascular: Normal rate, regular rhythm and normal heart sounds.  No murmur heard. Trace  BLE edema. Pulmonary/Chest: Effort normal and breath sounds normal. No respiratory distress. Abdominal: Soft.  There is no tenderness. Muscular Skeletal: crepitus with extension of both knees, uses a walker  Psychiatric: Patient has a normal mood and affect. behavior is normal. Judgment and thought content normal.  Recent Results (from the past 2160 hour(s))  POCT HgB A1C     Status: Abnormal   Collection Time: 05/30/20 10:30 AM  Result Value Ref Range   Hemoglobin A1C 5.9 (A) 4.0 - 5.6 %   HbA1c POC (<> result, manual entry)     HbA1c, POC (prediabetic range)     HbA1c, POC (controlled diabetic range)      Diabetic Foot Exam: Diabetic Foot Exam - Simple   Simple Foot Form Diabetic Foot exam was performed with the following findings: Yes 05/30/2020 11:07 AM  Visual Inspection See comments: Yes Sensation Testing Intact to touch and monofilament testing bilaterally: Yes Pulse Check Posterior Tibialis and Dorsalis pulse intact bilaterally: Yes Comments Bunion formation, thick toenails on left foot, hammer toe 2nd right foot with corn formation, also has callus formation  and dry skin       PHQ2/9: Depression screen [facility] 2/9 05/30/2020 05/22/2020 11/28/2019 11/11/2019 05/31/2019  Decreased Interest 0 0 0 0 0  Down, Depressed, Hopeless 0 0 0 0 0  PHQ - 2 Score 0 0 0 0 0  Altered sleeping - - 0 - 0  Tired, decreased energy - - 0 - 0  Change in appetite - - 0 - 0  Feeling bad or failure about yourself  - - 0 - 0  Trouble concentrating - - 0 - 0  Moving slowly or fidgety/restless - - 0 - 0  Suicidal thoughts - - 0 - 0  PHQ-9 Score - - 0 - 0  Difficult doing work/chores - - - - -  Some recent data might be hidden    phq 9 is negative   Fall Risk: Fall Risk  05/30/2020 05/22/2020 11/28/2019 11/11/2019 05/31/2019  Falls in the past year? 0 0 0 0 0  Number falls in past yr: 0 0 0 - 0  Injury with Fall? 0 0 0 - 0  Risk for fall due to : - Impaired balance/gait Impaired balance/gait - -  Risk for fall due to: Comment - - - - -  Follow up - Falls prevention discussed - - -    Functional Status Survey: Is the patient deaf or have difficulty hearing?: No Does the patient have difficulty seeing, even when wearing glasses/contacts?: No Does the patient have difficulty concentrating, remembering, or making decisions?: No Does the patient have difficulty walking or climbing stairs?: Yes Does the patient have difficulty dressing or bathing?: No Does the patient have difficulty doing errands alone such as visiting a doctor's office or shopping?: No    Assessment & Plan  1. Controlled type 2 diabetes mellitus with stage 3 chronic kidney disease, without long-term current use of insulin (HCC)  - HM Diabetes Foot Exam - POCT HgB A1C - Microalbumin / creatinine urine ratio - CBC with Differential/Platelet - COMPLETE METABOLIC PANEL WITH GFR  2. Dyslipidemia associated with type 2 diabetes mellitus (HCC)  - Lipid panel  3. Morbid obesity ([place])  Discussed with the patient the risk posed by an increased BMI. Discussed importance of portion control, calorie  counting and at least 150 minutes of physical activity weekly. Avoid sweet beverages and drink more water. Eat at least 6 servings of fruit and vegetables daily   4. Vitamin D deficiency  - VITAMIN D 25 Hydroxy (Vit-D Deficiency, Fractures)  5. Essential hypertension  - CBC with Differential/Platelet - COMPLETE METABOLIC PANEL WITH GFR  6. Primary osteoarthritis of both knees  rx for walker given to the patient today

## 2020-05-30 ENCOUNTER — Other Ambulatory Visit: Payer: Self-pay

## 2020-05-30 ENCOUNTER — Encounter: Payer: Self-pay | Admitting: Family Medicine

## 2020-05-30 ENCOUNTER — Ambulatory Visit (INDEPENDENT_AMBULATORY_CARE_PROVIDER_SITE_OTHER): Payer: Medicare Other | Admitting: Family Medicine

## 2020-05-30 VITALS — BP 138/74 | HR 91 | Temp 98.4°F | Resp 16 | Ht 64.0 in | Wt 218.0 lb

## 2020-05-30 DIAGNOSIS — M170 Bilateral primary osteoarthritis of knee: Secondary | ICD-10-CM | POA: Diagnosis not present

## 2020-05-30 DIAGNOSIS — R269 Unspecified abnormalities of gait and mobility: Secondary | ICD-10-CM | POA: Diagnosis not present

## 2020-05-30 DIAGNOSIS — E1169 Type 2 diabetes mellitus with other specified complication: Secondary | ICD-10-CM

## 2020-05-30 DIAGNOSIS — E1122 Type 2 diabetes mellitus with diabetic chronic kidney disease: Secondary | ICD-10-CM

## 2020-05-30 DIAGNOSIS — N183 Chronic kidney disease, stage 3 unspecified: Secondary | ICD-10-CM | POA: Diagnosis not present

## 2020-05-30 DIAGNOSIS — E559 Vitamin D deficiency, unspecified: Secondary | ICD-10-CM | POA: Diagnosis not present

## 2020-05-30 DIAGNOSIS — E785 Hyperlipidemia, unspecified: Secondary | ICD-10-CM | POA: Diagnosis not present

## 2020-05-30 DIAGNOSIS — I10 Essential (primary) hypertension: Secondary | ICD-10-CM | POA: Diagnosis not present

## 2020-05-30 LAB — POCT GLYCOSYLATED HEMOGLOBIN (HGB A1C): Hemoglobin A1C: 5.9 % — AB (ref 4.0–5.6)

## 2020-05-31 LAB — CBC WITH DIFFERENTIAL/PLATELET
Absolute Monocytes: 329 cells/uL (ref 200–950)
Basophils Absolute: 38 cells/uL (ref 0–200)
Basophils Relative: 0.7 %
Eosinophils Absolute: 405 cells/uL (ref 15–500)
Eosinophils Relative: 7.5 %
HCT: 38.3 % (ref 35.0–45.0)
Hemoglobin: 12.4 g/dL (ref 11.7–15.5)
Lymphs Abs: 1679 cells/uL (ref 850–3900)
MCH: 29.5 pg (ref 27.0–33.0)
MCHC: 32.4 g/dL (ref 32.0–36.0)
MCV: 91.2 fL (ref 80.0–100.0)
MPV: 9.9 fL (ref 7.5–12.5)
Monocytes Relative: 6.1 %
Neutro Abs: 2948 cells/uL (ref 1500–7800)
Neutrophils Relative %: 54.6 %
Platelets: 272 10*3/uL (ref 140–400)
RBC: 4.2 10*6/uL (ref 3.80–5.10)
RDW: 14.6 % (ref 11.0–15.0)
Total Lymphocyte: 31.1 %
WBC: 5.4 10*3/uL (ref 3.8–10.8)

## 2020-05-31 LAB — MICROALBUMIN / CREATININE URINE RATIO
Creatinine, Urine: 209 mg/dL (ref 20–275)
Microalb Creat Ratio: 6 mcg/mg creat (ref <30)
Microalb, Ur: 1.2 mg/dL

## 2020-05-31 LAB — COMPLETE METABOLIC PANEL WITH GFR
AG Ratio: 1.4 (calc) (ref 1.0–2.5)
ALT: 8 U/L (ref 6–29)
AST: 12 U/L (ref 10–35)
Albumin: 3.8 g/dL (ref 3.6–5.1)
Alkaline phosphatase (APISO): 79 U/L (ref 37–153)
BUN/Creatinine Ratio: 13 (calc) (ref 6–22)
BUN: 15 mg/dL (ref 7–25)
CO2: 29 mmol/L (ref 20–32)
Calcium: 9.3 mg/dL (ref 8.6–10.4)
Chloride: 105 mmol/L (ref 98–110)
Creat: 1.13 mg/dL — ABNORMAL HIGH (ref 0.60–0.88)
GFR, Est African American: 53 mL/min/{1.73_m2} — ABNORMAL LOW (ref >=60)
GFR, Est Non African American: 46 mL/min/{1.73_m2} — ABNORMAL LOW (ref >=60)
Globulin: 2.8 g/dL (calc) (ref 1.9–3.7)
Glucose, Bld: 100 mg/dL — ABNORMAL HIGH (ref 65–99)
Potassium: 3.9 mmol/L (ref 3.5–5.3)
Sodium: 144 mmol/L (ref 135–146)
Total Bilirubin: 1.4 mg/dL — ABNORMAL HIGH (ref 0.2–1.2)
Total Protein: 6.6 g/dL (ref 6.1–8.1)

## 2020-05-31 LAB — LIPID PANEL
Cholesterol: 148 mg/dL (ref <200)
HDL: 43 mg/dL — ABNORMAL LOW (ref >=50)
LDL Cholesterol (Calc): 88 mg/dL (calc)
Non-HDL Cholesterol (Calc): 105 mg/dL (calc) (ref <130)
Total CHOL/HDL Ratio: 3.4 (calc) (ref <5.0)
Triglycerides: 77 mg/dL (ref <150)

## 2020-05-31 LAB — VITAMIN D 25 HYDROXY (VIT D DEFICIENCY, FRACTURES): Vit D, 25-Hydroxy: 58 ng/mL (ref 30–100)

## 2020-06-07 ENCOUNTER — Telehealth: Payer: Self-pay

## 2020-06-07 NOTE — Telephone Encounter (Signed)
Copied from [place] [id number]. Topic: General - Other >> Jun 07, 2020  2:26 PM P[place], Tammy Lozano wrote: Reason for CRM: [facility] called stating they need the Pts clinical notes to submit to insurance. Please advise.

## 2020-06-08 DIAGNOSIS — M170 Bilateral primary osteoarthritis of knee: Secondary | ICD-10-CM | POA: Diagnosis not present

## 2020-06-08 DIAGNOSIS — R269 Unspecified abnormalities of gait and mobility: Secondary | ICD-10-CM | POA: Diagnosis not present

## 2020-06-08 NOTE — Telephone Encounter (Signed)
Faxed notes

## 2020-06-15 ENCOUNTER — Other Ambulatory Visit: Payer: Self-pay | Admitting: Family Medicine

## 2020-06-15 DIAGNOSIS — E1169 Type 2 diabetes mellitus with other specified complication: Secondary | ICD-10-CM

## 2020-06-15 DIAGNOSIS — E785 Hyperlipidemia, unspecified: Secondary | ICD-10-CM

## 2020-07-20 ENCOUNTER — Other Ambulatory Visit: Payer: Self-pay | Admitting: Family Medicine

## 2020-07-20 DIAGNOSIS — I10 Essential (primary) hypertension: Secondary | ICD-10-CM

## 2020-07-20 DIAGNOSIS — R809 Proteinuria, unspecified: Secondary | ICD-10-CM

## 2020-07-20 DIAGNOSIS — E1122 Type 2 diabetes mellitus with diabetic chronic kidney disease: Secondary | ICD-10-CM

## 2020-10-19 ENCOUNTER — Other Ambulatory Visit: Payer: Self-pay | Admitting: Family Medicine

## 2020-10-19 DIAGNOSIS — R809 Proteinuria, unspecified: Secondary | ICD-10-CM

## 2020-10-19 DIAGNOSIS — E1122 Type 2 diabetes mellitus with diabetic chronic kidney disease: Secondary | ICD-10-CM

## 2020-10-19 DIAGNOSIS — E1129 Type 2 diabetes mellitus with other diabetic kidney complication: Secondary | ICD-10-CM

## 2020-10-19 DIAGNOSIS — I10 Essential (primary) hypertension: Secondary | ICD-10-CM

## 2020-11-22 NOTE — Progress Notes (Deleted)
Name: Tammy Lozano   MRN: [id number]    DOB: Apr 03, 1941   Date:11/22/2020       Progress Note  Subjective  Chief Complaint  Follow Up  HPI  Diabetes type II: diagnosed October 2018 with hgbA1C of 6.6%, A1C has been at goal since, today it is down to 5.9 %  She denies polyphagia, polydipsia or polyuria. She is obese, her weight is stable since last visit.  She has obesity and also CKI and is on ARB, urine micro last visit was  78 . She has been avoiding junk food, no longer eating late at night and has lost a couple of pounds    Hyperlipidemia: she takes Atorvastatin, no myalgias, compliant with medication. Last HDL was lower at 41, she is trying to eat more walnuts and pecans.  She is not very physically active because of OA . We will recheck labs today    HTN: she takes losartan, states ACE caused a cough, no chest pain or palpitation, bp is at goal. Continue current medication    OA: left hip and knee also has left lower back pain .She takes prn medication otc , nothing a regular basis, stable.    Morbid obesity: she states her weight as an adult was 158 lbs, after third child weight was 132 lbs and remained that way until 1982 after she lost her mother, and worse since 1991 when her sister diet, she went above 200 lbs , and was gradually gaining weight but lost a couple of pounds since last visit    CKI: she used to see Dr. 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 at 41 , now on ARB, She denies pruritis and has normal urine output , we will recheck labs today   Patient Active Problem List   Diagnosis Date Noted   Morbid obesity ([place]) 11/04/2017   Osteopenia of left hip 06/01/2017   Controlled type 2 diabetes mellitus with chronic kidney disease ([place]) 11/28/2015   CKD (chronic kidney disease) stage 3, GFR 30-59 ml/min ([place]) 11/28/2015   Hypertension 12/04/2014   Hyperlipidemia 12/04/2014    Past Surgical  History:  Procedure Laterality Date   COLONOSCOPY W/ POLYPECTOMY  05/16/1994   COLONOSCOPY W/ POLYPECTOMY  12/23/2011   adenomatous polyp   COLONOSCOPY WITH PROPOFOL N/A 08/12/2017   Procedure: COLONOSCOPY WITH PROPOFOL;  Surgeon: Manya Silvas, MD;  Location: [facility] ENDOSCOPY;  Service: Endoscopy;  Laterality: N/A;   KNEE SURGERY Left 2010   arthroscopic    Family History  Problem Relation Age of Onset   Cancer Mother        lung   Cancer Father        brain   Aneurysm Sister    Cancer Brother        pancreatic   Cancer Brother        esophagus   Colon cancer Paternal Grandfather     Social History   Tobacco Use   Smoking status: Former    Packs/day: 2.00    Years: 40.00    Pack years: 80.00    Types: Cigarettes    Start date: 1962    Quit date: 2002    Years since quitting: 20.6   Smokeless tobacco: Never   Tobacco comments:    quit in 2002. Has not resumed smoking since 2002. Pt does not require smoking cessation materials.  Substance Use Topics   Alcohol  use: No    Alcohol/week: 0.0 standard drinks     Current Outpatient Medications:    atorvastatin (LIPITOR) 40 MG tablet, Take 1 tablet by mouth once daily, Disp: 90 tablet, Rfl: 1   Cholecalciferol (VITAMIN D-3) 1000 units CAPS, Take 1 capsule (1,000 Units total) by mouth daily., Disp: 30 capsule, Rfl: 0   losartan (COZAAR) 100 MG tablet, Take 1 tablet by mouth once daily, Disp: 90 tablet, Rfl: 0   Omega-3 Fatty Acids (FISH OIL) 1000 MG CAPS, Take 1,000 mg by mouth daily. , Disp: , Rfl:   Allergies  Allergen Reactions   Ace Inhibitors     cough    I personally reviewed {[id number]} with the patient/caregiver today.   ROS  ***  Objective  There were no vitals filed for this visit.  There is no height or weight on file to calculate BMI.  Physical Exam ***  No results found for this or any previous visit (from the past 2160 hour(s)).  Diabetic Foot Exam: Diabetic Foot Exam - Simple    No data filed    ***  PHQ2/9: Depression screen [facility] 2/9 05/30/2020 05/22/2020 11/28/2019 11/11/2019 05/31/2019  Decreased Interest 0 0 0 0 0  Down, Depressed, Hopeless 0 0 0 0 0  PHQ - 2 Score 0 0 0 0 0  Altered sleeping - - 0 - 0  Tired, decreased energy - - 0 - 0  Change in appetite - - 0 - 0  Feeling bad or failure about yourself  - - 0 - 0  Trouble concentrating - - 0 - 0  Moving slowly or fidgety/restless - - 0 - 0  Suicidal thoughts - - 0 - 0  PHQ-9 Score - - 0 - 0  Difficult doing work/chores - - - - -  Some recent data might be hidden    phq 9 is {gen pos [id number] ***  Fall Risk: Fall Risk  05/30/2020 05/22/2020 11/28/2019 11/11/2019 05/31/2019  Falls in the past year? 0 0 0 0 0  Number falls in past yr: 0 0 0 - 0  Injury with Fall? 0 0 0 - 0  Risk for fall due to : - Impaired balance/gait Impaired balance/gait - -  Risk for fall due to: Comment - - - - -  Follow up - Falls prevention discussed - - -   ***   Functional Status Survey:   ***   Assessment & Plan  *** There are no diagnoses linked to this encounter.

## 2020-11-27 ENCOUNTER — Ambulatory Visit: Payer: Medicare Other | Admitting: Family Medicine

## 2020-11-27 DIAGNOSIS — E1169 Type 2 diabetes mellitus with other specified complication: Secondary | ICD-10-CM

## 2020-12-10 ENCOUNTER — Other Ambulatory Visit: Payer: Self-pay | Admitting: Family Medicine

## 2020-12-10 DIAGNOSIS — E1169 Type 2 diabetes mellitus with other specified complication: Secondary | ICD-10-CM

## 2020-12-10 DIAGNOSIS — E785 Hyperlipidemia, unspecified: Secondary | ICD-10-CM

## 2020-12-12 ENCOUNTER — Other Ambulatory Visit: Payer: Self-pay | Admitting: Family Medicine

## 2020-12-12 DIAGNOSIS — E1169 Type 2 diabetes mellitus with other specified complication: Secondary | ICD-10-CM

## 2020-12-13 ENCOUNTER — Other Ambulatory Visit: Payer: Self-pay | Admitting: Family Medicine

## 2020-12-13 DIAGNOSIS — E1169 Type 2 diabetes mellitus with other specified complication: Secondary | ICD-10-CM

## 2020-12-17 NOTE — Progress Notes (Signed)
Name: Tammy Lozano   MRN: [id number]    DOB: Aug 22, 1940   Date:12/18/2020       Progress Note  Subjective  Chief Complaint  Follow Up  HPI  Diabetes type II: diagnosed October 2018 with hgbA1C of 6.6%, A1C has been at goal since, today it is down to 5.7%  She denies polyphagia, polydipsia or polyuria. She is obese, her weight is down two pounds since last visit.  She has obesity and also CKI and is on ARB, urine micro last visit was  55 . She has been avoiding junk food and is doing well. She also has dyslipidemia and is on statin therapy . Eye exam is up to date.    Hyperlipidemia: she takes Atorvastatin, no myalgias, compliant with medication. Last HDL was lower at 41, she is trying to eat more walnuts and pecans. Recheck level yearly    HTN: she takes losartan, states ACE caused a cough, no chest pain or palpitation, bp is at goal. BP is at goal for her.    OA: left hip and both  knee  .She takes prn NSAID's a few times a week, advised to switch to Tylenol due to CKI stage III, she uses a walker all the time, inside and outside the home, she has difficulty getting from chairs and also toilet. She is thinking about getting a lift chair also    Morbid obesity: BMI above 37 with co-morbidities. she states her weight as an adult was 158 lbs, after third child weight was 132 lbs and remained that way until 1982 after she lost her mother, and worse since 1991 when her sister diet, she went above 200 lbs , and was gradually gaining weight but  lost another 2 lbs since last visit.    CKI: she used to see Dr. 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77 , now on ARB, She denies pruritis and has normal urine output , discussed importance of avoiding nsaid's   Patient Active Problem List   Diagnosis Date Noted   Morbid obesity ([place]) 11/04/2017   Osteopenia of left hip 06/01/2017   Controlled type 2 diabetes mellitus with chronic kidney disease ([place]) 11/28/2015   CKD (chronic kidney disease) stage  3, GFR 30-59 ml/min ([place]) 11/28/2015   Hypertension 12/04/2014   Hyperlipidemia 12/04/2014    Past Surgical History:  Procedure Laterality Date   COLONOSCOPY W/ POLYPECTOMY  05/16/1994   COLONOSCOPY W/ POLYPECTOMY  12/23/2011   adenomatous polyp   COLONOSCOPY WITH PROPOFOL N/A 08/12/2017   Procedure: COLONOSCOPY WITH PROPOFOL;  Surgeon: Manya Silvas, MD;  Location: [facility] ENDOSCOPY;  Service: Endoscopy;  Laterality: N/A;   KNEE SURGERY Left 2010   arthroscopic    Family History  Problem Relation Age of Onset   Cancer Mother        lung   Cancer Father        brain   Aneurysm Sister    Cancer Brother        pancreatic   Cancer Brother        esophagus   Colon cancer Paternal Grandfather     Social History   Tobacco Use   Smoking status: Former    Packs/day: 2.00    Years: 40.00    Pack years: 80.00    Types: Cigarettes    Start date: 1962    Quit date: 2002    Years since quitting: 20.7   Smokeless tobacco: Never   Tobacco comments:  quit in 2002. Has not resumed smoking since 2002. Pt does not require smoking cessation materials.  Substance Use Topics   Alcohol use: No    Alcohol/week: 0.0 standard drinks     Current Outpatient Medications:    atorvastatin (LIPITOR) 40 MG tablet, Take 1 tablet by mouth once daily, Disp: 90 tablet, Rfl: 0   Cholecalciferol (VITAMIN D-3) 1000 units CAPS, Take 1 capsule (1,000 Units total) by mouth daily., Disp: 30 capsule, Rfl: 0   losartan (COZAAR) 100 MG tablet, Take 1 tablet by mouth once daily, Disp: 90 tablet, Rfl: 0   Omega-3 Fatty Acids (FISH OIL) 1000 MG CAPS, Take 1,000 mg by mouth daily. , Disp: , Rfl:   Allergies  Allergen Reactions   Ace Inhibitors     cough    I personally reviewed active problem list, medication list, allergies, family history, social history, health maintenance with the patient/caregiver today.   ROS  Constitutional: Negative for fever or weight change.  Respiratory: Negative for  cough and shortness of breath.   Cardiovascular: Negative for chest pain or palpitations.  Gastrointestinal: Negative for abdominal pain, no bowel changes.  Musculoskeletal: Positive for gait problem or joint swelling.  Skin: Negative for rash.  Neurological: Negative for dizziness or headache.  No other specific complaints in a complete review of systems (except as listed in HPI above).   Objective  Vitals:   12/18/20 1037  BP: 136/82  Pulse: 88  Resp: 16  Temp: 98.4 F (36.9 C)  SpO2: 96%  Weight: 216 lb (98 kg)  Height: '5\' 4"'$  (1.626 m)    Body mass index is 37.08 kg/m.  Physical Exam  Constitutional: Patient appears well-developed and well-nourished. Obese  No distress.  HEENT: head atraumatic, normocephalic, pupils equal and reactive to light, neck supple Cardiovascular: Normal rate, regular rhythm and normal heart sounds.  No murmur heard. No BLE edema. Pulmonary/Chest: Effort normal and breath sounds normal. No respiratory distress. Abdominal: Soft.  There is no tenderness. Muscular skeletal: crepitus with extension of both knees  Psychiatric: Patient has a normal mood and affect. behavior is normal. Judgment and thought content normal.   Recent Results (from the past 2160 hour(s))  POCT HgB A1C     Status: Abnormal   Collection Time: 12/18/20 10:47 AM  Result Value Ref Range   Hemoglobin A1C 5.7 (A) 4.0 - 5.6 %   HbA1c POC (<> result, manual entry)     HbA1c, POC (prediabetic range)     HbA1c, POC (controlled diabetic range)       PHQ2/9: Depression screen [facility] 2/9 12/18/2020 05/30/2020 05/22/2020 11/28/2019 11/11/2019  Decreased Interest 0 0 0 0 0  Down, Depressed, Hopeless 0 0 0 0 0  PHQ - 2 Score 0 0 0 0 0  Altered sleeping - - - 0 -  Tired, decreased energy - - - 0 -  Change in appetite - - - 0 -  Feeling bad or failure about yourself  - - - 0 -  Trouble concentrating - - - 0 -  Moving slowly or fidgety/restless - - - 0 -  Suicidal thoughts - - - 0 -   PHQ-9 Score - - - 0 -  Difficult doing work/chores - - - - -  Some recent data might be hidden    phq 9 is negative   Fall Risk: Fall Risk  12/18/2020 05/30/2020 05/22/2020 11/28/2019 11/11/2019  Falls in the past year? 0 0 0 0 0  Number falls in  past yr: 0 0 0 0 -  Injury with Fall? 0 0 0 0 -  Risk for fall due to : Impaired mobility;Impaired balance/gait - Impaired balance/gait Impaired balance/gait -  Risk for fall due to: Comment - - - - -  Follow up Falls prevention discussed - Falls prevention discussed - -      Functional Status Survey: Is the patient deaf or have difficulty hearing?: No Does the patient have difficulty seeing, even when wearing glasses/contacts?: No Does the patient have difficulty concentrating, remembering, or making decisions?: No Does the patient have difficulty walking or climbing stairs?: Yes Does the patient have difficulty dressing or bathing?: No Does the patient have difficulty doing errands alone such as visiting a doctor's office or shopping?: No    Assessment & Plan  1. Controlled type 2 diabetes mellitus with stage 3 chronic kidney disease, without long-term current use of insulin (HCC)  - POCT HgB A1C - losartan (COZAAR) 100 MG tablet; Take 1 tablet (100 mg total) by mouth daily.  Dispense: 90 tablet; Refill: 1  2. Dyslipidemia associated with type 2 diabetes mellitus (HCC)  - atorvastatin (LIPITOR) 40 MG tablet; Take 1 tablet (40 mg total) by mouth daily.  Dispense: 90 tablet; Refill: 1  3. Morbid obesity ([place])  BMI above 35 with co-morbidities   4. Essential hypertension  - losartan (COZAAR) 100 MG tablet; Take 1 tablet (100 mg total) by mouth daily.  Dispense: 90 tablet; Refill: 1  5. Gait difficulty  Raised toilet seat   6. Vitamin D deficiency   7. Primary osteoarthritis of both knees  Advised to switch to Tylenol max 3 g   8. Stage 3a chronic kidney disease ([place])  Avoid NsAID's offered labs today but she wants to  hold off until next visit   9. Diabetes mellitus with microalbuminuria (HCC)  - losartan (COZAAR) 100 MG tablet; Take 1 tablet (100 mg total) by mouth daily.  Dispense: 90 tablet; Refill: 1

## 2020-12-18 ENCOUNTER — Ambulatory Visit (INDEPENDENT_AMBULATORY_CARE_PROVIDER_SITE_OTHER): Payer: Medicare Other | Admitting: Family Medicine

## 2020-12-18 ENCOUNTER — Encounter: Payer: Self-pay | Admitting: Family Medicine

## 2020-12-18 ENCOUNTER — Other Ambulatory Visit: Payer: Self-pay

## 2020-12-18 VITALS — BP 136/82 | HR 88 | Temp 98.4°F | Resp 16 | Ht 64.0 in | Wt 216.0 lb

## 2020-12-18 DIAGNOSIS — E1129 Type 2 diabetes mellitus with other diabetic kidney complication: Secondary | ICD-10-CM | POA: Diagnosis not present

## 2020-12-18 DIAGNOSIS — E1122 Type 2 diabetes mellitus with diabetic chronic kidney disease: Secondary | ICD-10-CM | POA: Diagnosis not present

## 2020-12-18 DIAGNOSIS — N1831 Chronic kidney disease, stage 3a: Secondary | ICD-10-CM

## 2020-12-18 DIAGNOSIS — E559 Vitamin D deficiency, unspecified: Secondary | ICD-10-CM | POA: Diagnosis not present

## 2020-12-18 DIAGNOSIS — I10 Essential (primary) hypertension: Secondary | ICD-10-CM

## 2020-12-18 DIAGNOSIS — M170 Bilateral primary osteoarthritis of knee: Secondary | ICD-10-CM | POA: Diagnosis not present

## 2020-12-18 DIAGNOSIS — N183 Chronic kidney disease, stage 3 unspecified: Secondary | ICD-10-CM

## 2020-12-18 DIAGNOSIS — R809 Proteinuria, unspecified: Secondary | ICD-10-CM

## 2020-12-18 DIAGNOSIS — R269 Unspecified abnormalities of gait and mobility: Secondary | ICD-10-CM

## 2020-12-18 DIAGNOSIS — E785 Hyperlipidemia, unspecified: Secondary | ICD-10-CM

## 2020-12-18 DIAGNOSIS — E1169 Type 2 diabetes mellitus with other specified complication: Secondary | ICD-10-CM

## 2020-12-18 LAB — POCT GLYCOSYLATED HEMOGLOBIN (HGB A1C): Hemoglobin A1C: 5.7 % — AB (ref 4.0–5.6)

## 2020-12-18 MED ORDER — RAISED TOILET SEAT MISC: 1.0000 | Freq: Every day | 0 refills | Status: DC | PRN

## 2020-12-18 MED ORDER — LOSARTAN POTASSIUM 100 MG PO TABS: 100.0000 mg | ORAL_TABLET | Freq: Every day | ORAL | 1 refills | Status: DC

## 2020-12-18 MED ORDER — ATORVASTATIN CALCIUM 40 MG PO TABS: 40.0000 mg | ORAL_TABLET | Freq: Every day | ORAL | 1 refills | Status: DC

## 2020-12-18 NOTE — Patient Instructions (Signed)
Take Tylenol instead of ibuprofen, max of 3 grams per day

## 2020-12-21 ENCOUNTER — Encounter: Payer: Self-pay | Admitting: Family Medicine

## 2021-05-23 ENCOUNTER — Other Ambulatory Visit: Payer: Self-pay

## 2021-05-23 ENCOUNTER — Ambulatory Visit (INDEPENDENT_AMBULATORY_CARE_PROVIDER_SITE_OTHER): Payer: Medicare Other

## 2021-05-23 VITALS — BP 138/86 | HR 85 | Temp 98.2°F | Resp 16 | Ht 64.0 in | Wt 223.1 lb

## 2021-05-23 DIAGNOSIS — Z0000 Encounter for general adult medical examination without abnormal findings: Secondary | ICD-10-CM | POA: Diagnosis not present

## 2021-05-23 NOTE — Progress Notes (Signed)
Subjective:   Tammy Lozano is a 81 y.o. female who presents for Medicare Annual (Subsequent) preventive examination.  Review of Systems     Cardiac Risk Factors include: advanced age (>42men, >26 women);dyslipidemia;diabetes mellitus;hypertension;obesity (BMI >30kg/m2)     Objective:    Today's Vitals   05/23/21 1030 05/23/21 1033  BP: 138/86   Pulse: 85   Resp: 16   Temp: 98.2 F (36.8 C)   TempSrc: Oral   SpO2: 98%   Weight: 223 lb 1.6 oz (101.2 kg)   Height: 5\' 4"  (1.626 m)   PainSc:  5    Body mass index is 38.3 kg/m.  Advanced Directives 05/23/2021 05/22/2020 04/22/2019 03/29/2018 03/23/2018 12/26/2017 08/12/2017  Does Patient Have a Medical Advance Directive? No No No No No No No  Type of Advance Directive - - - - - - -  Copy of Healthcare Power of Attorney in Chart? - - - - - - -  Would patient like information on creating a medical advance directive? No - Patient declined No - Patient declined No - Patient declined No - Patient declined Yes (MAU/Ambulatory/Procedural Areas - Information given) - Yes (MAU/Ambulatory/Procedural Areas - Information given)    Current Medications (verified) Outpatient Encounter Medications as of 05/23/2021  Medication Sig   atorvastatin (LIPITOR) 40 MG tablet Take 1 tablet (40 mg total) by mouth daily.   Cholecalciferol (VITAMIN D-3) 1000 units CAPS Take 1 capsule (1,000 Units total) by mouth daily.   losartan (COZAAR) 100 MG tablet Take 1 tablet (100 mg total) by mouth daily.   Omega-3 Fatty Acids (FISH OIL) 1000 MG CAPS Take 1,000 mg by mouth daily.    [DISCONTINUED] Misc. Devices (RAISED TOILET SEAT) MISC 1 each by Does not apply route daily as needed.   No facility-administered encounter medications on file as of 05/23/2021.    Allergies (verified) Ace inhibitors   History: Past Medical History:  Diagnosis Date   Chronic kidney disease    stage 3   Diabetes mellitus without complication ([place])    Hyperlipidemia     Hypertension    Osteoarthritis    Past Surgical History:  Procedure Laterality Date   COLONOSCOPY W/ POLYPECTOMY  05/16/1994   COLONOSCOPY W/ POLYPECTOMY  12/23/2011   adenomatous polyp   COLONOSCOPY WITH PROPOFOL N/A 08/12/2017   Procedure: COLONOSCOPY WITH PROPOFOL;  Surgeon: Manya Silvas, MD;  Location: [facility] ENDOSCOPY;  Service: Endoscopy;  Laterality: N/A;   KNEE SURGERY Left 2010   arthroscopic   Family History  Problem Relation Age of Onset   Cancer Mother        lung   Cancer Father        brain   Aneurysm Sister    Cancer Brother        pancreatic   Cancer Brother        esophagus   Colon cancer Paternal Grandfather    Social History   Socioeconomic History   Marital status: Divorced    Spouse name: Not on file   Number of children: 3   Years of education: 12   Highest education level: 12th grade  Occupational History    Employer: RETIRED  Tobacco Use   Smoking status: Former    Packs/day: 2.00    Years: 40.00    Pack years: 80.00    Types: Cigarettes    Start date: 1962    Quit date: 2002    Years since quitting: 21.1   Smokeless tobacco: Never  Tobacco comments:    quit in 2002. Has not resumed smoking since 2002. Pt does not require smoking cessation materials.  Vaping Use   Vaping Use: Never used  Substance and Sexual Activity   Alcohol use: No    Alcohol/week: 0.0 standard drinks   Drug use: No   Sexual activity: Not Currently  Other Topics Concern   Not on file  Social History Narrative   Lives with grandson , his wife and 3 great-grandchildren    Social Determinants of Health   Financial Resource Strain: Low Risk    Difficulty of Paying Living Expenses: Not hard at all  Food Insecurity: No Food Insecurity   Worried About Charity fundraiser in the Last Year: Never true   Arboriculturist in the Last Year: Never true  Transportation Needs: No Transportation Needs   Lack of Transportation (Medical): No   Lack of Transportation  (Non-Medical): No  Physical Activity: Inactive   Days of Exercise per Week: 0 days   Minutes of Exercise per Session: 0 min  Stress: No Stress Concern Present   Feeling of Stress : Not at all  Social Connections: Socially Isolated   Frequency of Communication with Friends and Family: More than three times a week   Frequency of Social Gatherings with Friends and Family: More than three times a week   Attends Religious Services: Never   Marine scientist or Organizations: No   Attends Archivist Meetings: Never   Marital Status: Divorced    Tobacco Counseling Counseling given: Not Answered Tobacco comments: quit in 2002. Has not resumed smoking since 2002. Pt does not require smoking cessation materials.   Clinical Intake:  Pre-visit preparation completed: Yes  Pain : 0-10 Pain Score: 5  Pain Type: Chronic pain Pain Location: Hip (knees) Pain Orientation: Right, Left Pain Descriptors / Indicators: Aching, Sore Pain Onset: More than a month ago Pain Frequency: Constant     BMI - recorded: 38.3 Nutritional Status: BMI > 30  Obese Nutritional Risks: None Diabetes: Yes CBG done?: No Did pt. bring in CBG monitor from home?: No  How often do you need to have someone help you when you read instructions, pamphlets, or other written materials from your doctor or pharmacy?: 1 - Never  Nutrition Risk Assessment:  Has the patient had any N/V/D within the last 2 months?  No  Does the patient have any non-healing wounds?  No  Has the patient had any unintentional weight loss or weight gain?  No   Diabetes:  Is the patient diabetic?  Yes  If diabetic, was a CBG obtained today?  No  Did the patient bring in their glucometer from home?  No  How often do you monitor your CBG's? Pt does not actively check blood sugar.   Financial Strains and Diabetes Management:  Are you having any financial strains with the device, your supplies or your medication? No .  Does  the patient want to be seen by Chronic Care Management for management of their diabetes?  No  Would the patient like to be referred to a Nutritionist or for Diabetic Management?  No   Diabetic Exams:  Diabetic Eye Exam: Completed 05/24/20.   Diabetic Foot Exam: Completed 05/30/20.   Interpreter Needed?: No  Information entered by :: Clemetine Marker LPN   Activities of Daily Living In your present state of health, do you have any difficulty performing the following activities: 05/23/2021 12/18/2020  Hearing? N  N  Vision? N N  Difficulty concentrating or making decisions? N N  Walking or climbing stairs? Y Y  Dressing or bathing? N N  Doing errands, shopping? N N  Preparing Food and eating ? N -  Using the Toilet? N -  In the past six months, have you accidently leaked urine? N -  Do you have problems with loss of bowel control? N -  Managing your Medications? N -  Managing your Finances? N -  Housekeeping or managing your Housekeeping? N -  Some recent data might be hidden    Patient Care Team: Steele Sizer, MD as PCP - General (Family Medicine)  Indicate any recent Medical Services you may have received from other than [facility] providers in the past year (date may be approximate).     Assessment:   This is a routine wellness examination for [place].  Hearing/Vision screen Hearing Screening - Comments:: Pt denied hearing difficulty  Vision Screening - Comments:: Annual vision screenings done by [facility]  Dietary issues and exercise activities discussed: Current Exercise Habits: The patient does not participate in regular exercise at present, Exercise limited by: orthopedic condition(s)   Goals Addressed   None    Depression Screen PHQ 2/9 Scores 05/23/2021 12/18/2020 05/30/2020 05/22/2020 11/28/2019 11/11/2019 05/31/2019  PHQ - 2 Score 0 0 0 0 0 0 0  PHQ- 9 Score - - - - 0 - 0    Fall Risk Fall Risk  05/23/2021 12/18/2020 05/30/2020 05/22/2020 11/28/2019  Falls in the past  year? 0 0 0 0 0  Number falls in past yr: 0 0 0 0 0  Injury with Fall? 0 0 0 0 0  Risk for fall due to : Impaired balance/gait Impaired mobility;Impaired balance/gait - Impaired balance/gait Impaired balance/gait  Risk for fall due to: Comment - - - - -  Follow up Falls prevention discussed Falls prevention discussed - Falls prevention discussed -    FALL RISK PREVENTION PERTAINING TO THE HOME:  Any stairs in or around the home? No  If so, are there any without handrails? No  Home free of loose throw rugs in walkways, pet beds, electrical cords, etc? Yes  Adequate lighting in your home to reduce risk of falls? Yes   ASSISTIVE DEVICES UTILIZED TO PREVENT FALLS:  Life alert? No  Use of a cane, walker or w/c? Yes  Grab bars in the bathroom? No  Shower chair or bench in shower? Yes  Elevated toilet seat or a handicapped toilet? No  TIMED UP AND GO:  Was the test performed? Yes .  Length of time to ambulate 10 feet: 7 sec.   Gait slow and steady with assistive device  Cognitive Function: Normal cognitive status assessed by direct observation by this Nurse Health Advisor. No abnormalities found.       6CIT Screen 04/22/2019 03/23/2018 03/20/2017 03/17/2016  What Year? 0 points 0 points 0 points 0 points  What month? 0 points 0 points 0 points 0 points  What time? 0 points 0 points 0 points 0 points  Count back from 20 0 points 0 points 0 points 0 points  Months in reverse 0 points 0 points 0 points 0 points  Repeat phrase 0 points 0 points 2 points 0 points  Total Score 0 0 2 0    Immunizations Immunization History  Administered Date(s) Administered   PFIZER(Purple Top)SARS-COV-2 Vaccination 06/23/2019, 07/19/2019, 03/09/2020   Pneumococcal Conjugate-13 02/28/2019   Pneumococcal Polysaccharide-23 11/04/2017  Tdap 01/24/2014    TDAP status: Up to date  Flu Vaccine status: Declined, Education has been provided regarding the importance of this vaccine but patient still  declined. Advised may receive this vaccine at local pharmacy or Health Dept. Aware to provide a copy of the vaccination record if obtained from local pharmacy or Health Dept. Verbalized acceptance and understanding.  Pneumococcal vaccine status: Up to date  Covid-19 vaccine status: Completed vaccines  Qualifies for Shingles Vaccine? Yes   Zostavax completed No   Shingrix Completed?: No.    Education has been provided regarding the importance of this vaccine. Patient has been advised to call insurance company to determine out of pocket expense if they have not yet received this vaccine. Advised may also receive vaccine at local pharmacy or Health Dept. Verbalized acceptance and understanding.  Screening Tests Health Maintenance  Topic Date Due   COVID-19 Vaccine (4 - Booster for Pfizer series) 05/04/2020   INFLUENZA VACCINE  07/05/2021 (Originally 11/05/2020)   Zoster Vaccines- Shingrix (1 of 2) 08/20/2021 (Originally 05/09/1989)   MAMMOGRAM  05/23/2022 (Originally 05/09/1958)   DEXA SCAN  05/23/2022 (Originally 05/09/2004)   OPHTHALMOLOGY EXAM  05/24/2021   FOOT EXAM  05/30/2021   HEMOGLOBIN A1C  06/17/2021   COLONOSCOPY (Pts 45-68yrs Insurance coverage will need to be confirmed)  08/13/2022   TETANUS/TDAP  01/25/2024   Pneumonia Vaccine 31+ Years old  Completed   HPV VACCINES  Aged Out    Health Maintenance  Health Maintenance Due  Topic Date Due   COVID-19 Vaccine (4 - Booster for [place] series) 05/04/2020    Colorectal cancer screening: No longer required.   Mammogram status: No longer required due to age.  Bone density status: no longer required due to age  Lung Cancer Screening: (Low Dose CT Chest recommended if Age 30-80 years, 30 pack-year currently smoking OR have quit w/in 15years.) does not qualify.   Additional Screening:  Hepatitis C Screening: does not qualify  Vision Screening: Recommended annual ophthalmology exams for early detection of glaucoma and other  disorders of the eye. Is the patient up to date with their annual eye exam?  Yes  Who is the provider or what is the name of the office in which the patient attends annual eye exams? [place] Screening: Recommended annual dental exams for proper oral hygiene  Community Resource Referral / Chronic Care Management: CRR required this visit?  No   CCM required this visit?  No      Plan:     I have personally reviewed and noted the following in the patients chart:   Medical and social history Use of alcohol, tobacco or illicit drugs  Current medications and supplements including opioid prescriptions.  Functional ability and status Nutritional status Physical activity Advanced directives List of other physicians Hospitalizations, surgeries, and ER visits in previous 12 months Vitals Screenings to include cognitive, depression, and falls Referrals and appointments  In addition, I have reviewed and discussed with patient certain preventive protocols, quality metrics, and best practice recommendations. A written personalized care plan for preventive services as well as general preventive health recommendations were provided to patient.     Clemetine Marker, LPN   [id number]   Nurse Notes: none

## 2021-05-23 NOTE — Patient Instructions (Signed)
Tammy Lozano , Thank you for taking time to come for your Medicare Wellness Visit. I appreciate your ongoing commitment to your health goals. Please review the following plan we discussed and let me know if I can assist you in the future.   Screening recommendations/referrals: Colonoscopy: no longer required Mammogram: no longer required Bone Density: no longer required Recommended yearly ophthalmology/optometry visit for glaucoma screening and checkup Recommended yearly dental visit for hygiene and checkup  Vaccinations: Influenza vaccine: declined Pneumococcal vaccine: done 02/28/19 Tdap vaccine: done 01/24/14 Shingles vaccine: Shingrix discussed. Please contact your pharmacy for coverage information.  Covid-19:done 06/23/19, 07/19/19 & 03/09/20  Advanced directives: Please bring a copy of your health care power of attorney and living will to the office at your convenience once you have completed those documents.   Conditions/risks identified: Recommend increasing activity as tolerated  Next appointment: Follow up in one year for your annual wellness visit    Preventive Care 65 Years and Older, Female Preventive care refers to lifestyle choices and visits with your health care provider that can promote health and wellness. What does preventive care include? A yearly physical exam. This is also called an annual well check. Dental exams once or twice a year. Routine eye exams. Ask your health care provider how often you should have your eyes checked. Personal lifestyle choices, including: Daily care of your teeth and gums. Regular physical activity. Eating a healthy diet. Avoiding tobacco and drug use. Limiting alcohol use. Practicing safe sex. Taking low-dose aspirin every day. Taking vitamin and mineral supplements as recommended by your health care provider. What happens during an annual well check? The services and screenings done by your health care provider during your annual  well check will depend on your age, overall health, lifestyle risk factors, and family history of disease. Counseling  Your health care provider may ask you questions about your: Alcohol use. Tobacco use. Drug use. Emotional well-being. Home and relationship well-being. Sexual activity. Eating habits. History of falls. Memory and ability to understand (cognition). Work and work Statistician. Reproductive health. Screening  You may have the following tests or measurements: Height, weight, and BMI. Blood pressure. Lipid and cholesterol levels. These may be checked every 5 years, or more frequently if you are over 34 years old. Skin check. Lung cancer screening. You may have this screening every year starting at age 70 if you have a 30-pack-year history of smoking and currently smoke or have quit within the past 15 years. Fecal occult blood test (FOBT) of the stool. You may have this test every year starting at age 27. Flexible sigmoidoscopy or colonoscopy. You may have a sigmoidoscopy every 5 years or a colonoscopy every 10 years starting at age 78. Hepatitis C blood test. Hepatitis B blood test. Sexually transmitted disease (STD) testing. Diabetes screening. This is done by checking your blood sugar (glucose) after you have not eaten for a while (fasting). You may have this done every 1-3 years. Bone density scan. This is done to screen for osteoporosis. You may have this done starting at age 62. Mammogram. This may be done every 1-2 years. Talk to your health care provider about how often you should have regular mammograms. Talk with your health care provider about your test results, treatment options, and if necessary, the need for more tests. Vaccines  Your health care provider may recommend certain vaccines, such as: Influenza vaccine. This is recommended every year. Tetanus, diphtheria, and acellular pertussis (Tdap, Td) vaccine. You may need a  Td booster every 10 years. Zoster  vaccine. You may need this after age 98. Pneumococcal 13-valent conjugate (PCV13) vaccine. One dose is recommended after age 46. Pneumococcal polysaccharide (PPSV23) vaccine. One dose is recommended after age 75. Talk to your health care provider about which screenings and vaccines you need and how often you need them. This information is not intended to replace advice given to you by your health care provider. Make sure you discuss any questions you have with your health care provider. Document Released: 04/20/2015 Document Revised: 12/12/2015 Document Reviewed: 01/23/2015 Elsevier Interactive Patient Education  2017 [employer] Prevention in the Home Falls can cause injuries. They can happen to people of all ages. There are many things you can do to make your home safe and to help prevent falls. What can I do on the outside of my home? Regularly fix the edges of walkways and driveways and fix any cracks. Remove anything that might make you trip as you walk through a door, such as a raised step or threshold. Trim any bushes or trees on the path to your home. Use bright outdoor lighting. Clear any walking paths of anything that might make someone trip, such as rocks or tools. Regularly check to see if handrails are loose or broken. Make sure that both sides of any steps have handrails. Any raised decks and porches should have guardrails on the edges. Have any leaves, snow, or ice cleared regularly. Use sand or salt on walking paths during winter. Clean up any spills in your garage right away. This includes oil or grease spills. What can I do in the bathroom? Use night lights. Install grab bars by the toilet and in the tub and shower. Do not use towel bars as grab bars. Use non-skid mats or decals in the tub or shower. If you need to sit down in the shower, use a plastic, non-slip stool. Keep the floor dry. Clean up any water that spills on the floor as soon as it happens. Remove  soap buildup in the tub or shower regularly. Attach bath mats securely with double-sided non-slip rug tape. Do not have throw rugs and other things on the floor that can make you trip. What can I do in the bedroom? Use night lights. Make sure that you have a light by your bed that is easy to reach. Do not use any sheets or blankets that are too big for your bed. They should not hang down onto the floor. Have a firm chair that has side arms. You can use this for support while you get dressed. Do not have throw rugs and other things on the floor that can make you trip. What can I do in the kitchen? Clean up any spills right away. Avoid walking on wet floors. Keep items that you use a lot in easy-to-reach places. If you need to reach something above you, use a strong step stool that has a grab bar. Keep electrical cords out of the way. Do not use floor polish or wax that makes floors slippery. If you must use wax, use non-skid floor wax. Do not have throw rugs and other things on the floor that can make you trip. What can I do with my stairs? Do not leave any items on the stairs. Make sure that there are handrails on both sides of the stairs and use them. Fix handrails that are broken or loose. Make sure that handrails are as long as the stairways. Check any  carpeting to make sure that it is firmly attached to the stairs. Fix any carpet that is loose or worn. Avoid having throw rugs at the top or bottom of the stairs. If you do have throw rugs, attach them to the floor with carpet tape. Make sure that you have a light switch at the top of the stairs and the bottom of the stairs. If you do not have them, ask someone to add them for you. What else can I do to help prevent falls? Wear shoes that: Do not have high heels. Have rubber bottoms. Are comfortable and fit you well. Are closed at the toe. Do not wear sandals. If you use a stepladder: Make sure that it is fully opened. Do not climb a  closed stepladder. Make sure that both sides of the stepladder are locked into place. Ask someone to hold it for you, if possible. Clearly mark and make sure that you can see: Any grab bars or handrails. First and last steps. Where the edge of each step is. Use tools that help you move around (mobility aids) if they are needed. These include: Canes. Walkers. Scooters. Crutches. Turn on the lights when you go into a dark area. Replace any light bulbs as soon as they burn out. Set up your furniture so you have a clear path. Avoid moving your furniture around. If any of your floors are uneven, fix them. If there are any pets around you, be aware of where they are. Review your medicines with your doctor. Some medicines can make you feel dizzy. This can increase your chance of falling. Ask your doctor what other things that you can do to help prevent falls. This information is not intended to replace advice given to you by your health care provider. Make sure you discuss any questions you have with your health care provider. Document Released: 01/18/2009 Document Revised: 08/30/2015 Document Reviewed: 04/28/2014 Elsevier Interactive Patient Education  2017 [employer].

## 2021-06-03 LAB — HM DIABETES EYE EXAM

## 2021-06-17 NOTE — Progress Notes (Unsigned)
Name: Tammy Lozano   MRN: [id number]    DOB: [birth year]   Date:06/17/2021       Progress Note  Subjective  Chief Complaint  Follow Up  HPI  Diabetes type II: diagnosed October 2018 with hgbA1C of 6.6%, A1C has been at goal since, today it is down to 5.7%  She denies polyphagia, polydipsia or polyuria. She is obese, her weight is down two pounds since last visit.  She has obesity and also CKI and is on ARB, urine micro last visit was  56 . She has been avoiding junk food and is doing well. She also has dyslipidemia and is on statin therapy . Eye exam is up to date.    Hyperlipidemia: she takes Atorvastatin, no myalgias, compliant with medication. Last HDL was lower at 41, she is trying to eat more walnuts and pecans. Recheck level yearly    HTN: she takes losartan, states ACE caused a cough, no chest pain or palpitation, bp is at goal. BP is at goal for her.    OA: left hip and both  knee  .She takes prn NSAID's a few times a week, advised to switch to Tylenol due to CKI stage III, she uses a walker all the time, inside and outside the home, she has difficulty getting from chairs and also toilet. She is thinking about getting a lift chair also    Morbid obesity: BMI above 37 with co-morbidities. she states her weight as an adult was 158 lbs, after third child weight was 132 lbs and remained that way until 1982 after she lost her mother, and worse since 1991 when her sister diet, she went above 200 lbs , and was gradually gaining weight but  lost another 2 lbs since last visit.    CKI: she used to see Dr. 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75 , now on ARB, She denies pruritis and has normal urine output , discussed importance of avoiding nsaid's   Patient Active Problem List   Diagnosis Date Noted   Morbid obesity ([place]) 11/04/2017   Osteopenia of left hip 06/01/2017   Controlled type 2 diabetes mellitus with chronic kidney disease ([place]) 11/28/2015   CKD (chronic kidney disease) stage  3, GFR 30-59 ml/min ([place]) 11/28/2015   Hypertension 12/04/2014   Hyperlipidemia 12/04/2014    Past Surgical History:  Procedure Laterality Date   COLONOSCOPY W/ POLYPECTOMY  05/16/1994   COLONOSCOPY W/ POLYPECTOMY  12/23/2011   adenomatous polyp   COLONOSCOPY WITH PROPOFOL N/A 08/12/2017   Procedure: COLONOSCOPY WITH PROPOFOL;  Surgeon: Manya Silvas, MD;  Location: [facility] ENDOSCOPY;  Service: Endoscopy;  Laterality: N/A;   KNEE SURGERY Left 2010   arthroscopic    Family History  Problem Relation Age of Onset   Cancer Mother        lung   Cancer Father        brain   Aneurysm Sister    Cancer Brother        pancreatic   Cancer Brother        esophagus   Colon cancer Paternal Grandfather     Social History   Tobacco Use   Smoking status: Former    Packs/day: 2.00    Years: 40.00    Pack years: 80.00    Types: Cigarettes    Start date: 1962    Quit date: 2002    Years since quitting: 21.2   Smokeless tobacco: Never   Tobacco comments:  quit in 2002. Has not resumed smoking since 2002. Pt does not require smoking cessation materials.  Substance Use Topics   Alcohol use: No    Alcohol/week: 0.0 standard drinks     Current Outpatient Medications:    atorvastatin (LIPITOR) 40 MG tablet, Take 1 tablet (40 mg total) by mouth daily., Disp: 90 tablet, Rfl: 1   Cholecalciferol (VITAMIN D-3) 1000 units CAPS, Take 1 capsule (1,000 Units total) by mouth daily., Disp: 30 capsule, Rfl: 0   losartan (COZAAR) 100 MG tablet, Take 1 tablet (100 mg total) by mouth daily., Disp: 90 tablet, Rfl: 1   Omega-3 Fatty Acids (FISH OIL) 1000 MG CAPS, Take 1,000 mg by mouth daily. , Disp: , Rfl:   Allergies  Allergen Reactions   Ace Inhibitors     cough    I personally reviewed active problem list, medication list, allergies, family history, social history, health maintenance with the patient/caregiver today.   ROS  ***  Objective  There were no vitals filed for this  visit.  There is no height or weight on file to calculate BMI.  Physical Exam ***  No results found for this or any previous visit (from the past 2160 hour(s)).  Diabetic Foot Exam: Diabetic Foot Exam - Simple   No data filed    ***  PHQ2/9: Depression screen [facility] 2/9 05/23/2021 12/18/2020 05/30/2020 05/22/2020 11/28/2019  Decreased Interest 0 0 0 0 0  Down, Depressed, Hopeless 0 0 0 0 0  PHQ - 2 Score 0 0 0 0 0  Altered sleeping - - - - 0  Tired, decreased energy - - - - 0  Change in appetite - - - - 0  Feeling bad or failure about yourself  - - - - 0  Trouble concentrating - - - - 0  Moving slowly or fidgety/restless - - - - 0  Suicidal thoughts - - - - 0  PHQ-9 Score - - - - 0  Difficult doing work/chores - - - - -  Some recent data might be hidden    phq 9 is {gen pos [id number]}   Fall Risk: Fall Risk  05/23/2021 12/18/2020 05/30/2020 05/22/2020 11/28/2019  Falls in the past year? 0 0 0 0 0  Number falls in past yr: 0 0 0 0 0  Injury with Fall? 0 0 0 0 0  Risk for fall due to : Impaired balance/gait Impaired mobility;Impaired balance/gait - Impaired balance/gait Impaired balance/gait  Risk for fall due to: Comment - - - - -  Follow up Falls prevention discussed Falls prevention discussed - Falls prevention discussed -      Functional Status Survey:      Assessment & Plan  *** There are no diagnoses linked to this encounter.

## 2021-06-18 ENCOUNTER — Encounter: Payer: Self-pay | Admitting: Family Medicine

## 2021-06-18 ENCOUNTER — Ambulatory Visit (INDEPENDENT_AMBULATORY_CARE_PROVIDER_SITE_OTHER): Payer: Medicare Other | Admitting: Family Medicine

## 2021-06-18 VITALS — BP 138/64 | HR 64 | Resp 16 | Ht 62.0 in | Wt 218.0 lb

## 2021-06-18 DIAGNOSIS — E1122 Type 2 diabetes mellitus with diabetic chronic kidney disease: Secondary | ICD-10-CM

## 2021-06-18 DIAGNOSIS — N1831 Chronic kidney disease, stage 3a: Secondary | ICD-10-CM

## 2021-06-18 DIAGNOSIS — N183 Chronic kidney disease, stage 3 unspecified: Secondary | ICD-10-CM

## 2021-06-18 DIAGNOSIS — R269 Unspecified abnormalities of gait and mobility: Secondary | ICD-10-CM

## 2021-06-18 DIAGNOSIS — E1129 Type 2 diabetes mellitus with other diabetic kidney complication: Secondary | ICD-10-CM

## 2021-06-18 DIAGNOSIS — R809 Proteinuria, unspecified: Secondary | ICD-10-CM

## 2021-06-18 DIAGNOSIS — E785 Hyperlipidemia, unspecified: Secondary | ICD-10-CM

## 2021-06-18 DIAGNOSIS — I10 Essential (primary) hypertension: Secondary | ICD-10-CM

## 2021-06-18 DIAGNOSIS — E1169 Type 2 diabetes mellitus with other specified complication: Secondary | ICD-10-CM

## 2021-06-18 DIAGNOSIS — M170 Bilateral primary osteoarthritis of knee: Secondary | ICD-10-CM

## 2021-06-18 DIAGNOSIS — E559 Vitamin D deficiency, unspecified: Secondary | ICD-10-CM

## 2021-06-18 MED ORDER — ATORVASTATIN CALCIUM 40 MG PO TABS: 40.0000 mg | ORAL_TABLET | Freq: Every day | ORAL | 1 refills | Status: DC

## 2021-06-18 MED ORDER — LOSARTAN POTASSIUM 100 MG PO TABS: 100.0000 mg | ORAL_TABLET | Freq: Every day | ORAL | 1 refills | Status: DC

## 2021-06-19 LAB — CBC WITH DIFFERENTIAL/PLATELET
Absolute Monocytes: 422 cells/uL (ref 200–950)
Basophils Absolute: 51 cells/uL (ref 0–200)
Basophils Relative: 0.9 %
Eosinophils Absolute: 200 cells/uL (ref 15–500)
Eosinophils Relative: 3.5 %
HCT: 40.4 % (ref 35.0–45.0)
Hemoglobin: 13.3 g/dL (ref 11.7–15.5)
Lymphs Abs: 1653 cells/uL (ref 850–3900)
MCH: 30.9 pg (ref 27.0–33.0)
MCHC: 32.9 g/dL (ref 32.0–36.0)
MCV: 93.7 fL (ref 80.0–100.0)
MPV: 10.1 fL (ref 7.5–12.5)
Monocytes Relative: 7.4 %
Neutro Abs: 3374 cells/uL (ref 1500–7800)
Neutrophils Relative %: 59.2 %
Platelets: 261 10*3/uL (ref 140–400)
RBC: 4.31 10*6/uL (ref 3.80–5.10)
RDW: 13.2 % (ref 11.0–15.0)
Total Lymphocyte: 29 %
WBC: 5.7 10*3/uL (ref 3.8–10.8)

## 2021-06-19 LAB — COMPLETE METABOLIC PANEL WITH GFR
AG Ratio: 1.6 (calc) (ref 1.0–2.5)
ALT: 10 U/L (ref 6–29)
AST: 12 U/L (ref 10–35)
Albumin: 4.2 g/dL (ref 3.6–5.1)
Alkaline phosphatase (APISO): 77 U/L (ref 37–153)
BUN/Creatinine Ratio: 18 (calc) (ref 6–22)
BUN: 22 mg/dL (ref 7–25)
CO2: 28 mmol/L (ref 20–32)
Calcium: 9.9 mg/dL (ref 8.6–10.4)
Chloride: 108 mmol/L (ref 98–110)
Creat: 1.25 mg/dL — ABNORMAL HIGH (ref 0.60–0.95)
Globulin: 2.6 g/dL (calc) (ref 1.9–3.7)
Glucose, Bld: 104 mg/dL — ABNORMAL HIGH (ref 65–99)
Potassium: 4.3 mmol/L (ref 3.5–5.3)
Sodium: 143 mmol/L (ref 135–146)
Total Bilirubin: 1 mg/dL (ref 0.2–1.2)
Total Protein: 6.8 g/dL (ref 6.1–8.1)
eGFR: 43 mL/min/{1.73_m2} — ABNORMAL LOW (ref >=60)

## 2021-06-19 LAB — HEMOGLOBIN A1C
Hgb A1c MFr Bld: 6.1 % of total Hgb — ABNORMAL HIGH (ref <5.7)
Mean Plasma Glucose: 128 mg/dL
eAG (mmol/L): 7.1 mmol/L

## 2021-06-19 LAB — VITAMIN D 25 HYDROXY (VIT D DEFICIENCY, FRACTURES): Vit D, 25-Hydroxy: 103 ng/mL — ABNORMAL HIGH (ref 30–100)

## 2021-06-19 LAB — LIPID PANEL
Cholesterol: 150 mg/dL (ref <200)
HDL: 49 mg/dL — ABNORMAL LOW (ref >=50)
LDL Cholesterol (Calc): 83 mg/dL (calc)
Non-HDL Cholesterol (Calc): 101 mg/dL (calc) (ref <130)
Total CHOL/HDL Ratio: 3.1 (calc) (ref <5.0)
Triglycerides: 90 mg/dL (ref <150)

## 2021-06-19 LAB — MICROALBUMIN / CREATININE URINE RATIO
Creatinine, Urine: 169 mg/dL (ref 20–275)
Microalb Creat Ratio: 7 mcg/mg creat (ref <30)
Microalb, Ur: 1.2 mg/dL

## 2021-12-16 NOTE — Progress Notes (Unsigned)
Name: Tammy Lozano   MRN: [id number]    DOB: Jun 21, 1940   Date:12/17/2021       Progress Note  Subjective  Chief Complaint  Follow Up  HPI  Diabetes type II: diagnosed October 2018 with hgbA1C of 6.6%, A1C has been at goal since, today it is down to 5.6%  She denies polyphagia, polydipsia or polyuria. She is obese, her weight has been stable.  She has obesity and also CKI and is on ARB, urine micro used to be elevated but back to normal range, she had a GFR of 43 back in March offered to recheck today but she wants to hold off until next visit   She also has dyslipidemia and is on statin therapy . Eye exam is up to date, she had it done 05/2021     Dyslipidemia she takes Atorvastatin, Last LDL was 83, explained goal is below 70 and she agrees on changing from Atorvastatin 40 to Rosuvastatin 20 mg and recheck labs next visit    HTN: she takes losartan, states ACE caused a cough, no chest pain or palpitation, BP is at goal.    OA: left hip and both  knee  .She is no longer taking NSAID's , only on Tylenol prn, she has  CKI stage III, she uses a walker all the time, inside and outside the home, she has difficulty getting from chairs and also toilet, she had bars in her bathroom. She uses a walker    Morbid obesity: BMI above 35 with co-morbidities. she states her weight as an adult was 158 lbs, after third child weight was 132 lbs and remained that way until 1982 after she lost her mother, and worse since 1991 when her sister diet, she went above 200 lbs , weight has been stable , however she is still eating chocolate and not ready to stop    CKI: she used to see Dr. Candiss Norse,  she has a history of  microalbuminuria at 47 , now on ARB, GFR was in the 40's and stable. She denies pruritis and has normal urine output , stay off NSAID's  Mold in her apartment: she states it was treated last year but states it is spreading again, she has an occasional cough, no SOB, she has talked to her landlord . She has  noticed some rhinorrhea.   Patient Active Problem List   Diagnosis Date Noted   Dyslipidemia associated with type 2 diabetes mellitus ([place]) 06/18/2021   Diabetes mellitus with microalbuminuria ([place]) 06/18/2021   Morbid obesity ([place]) 11/04/2017   Osteopenia of left hip 06/01/2017   Controlled type 2 diabetes mellitus with chronic kidney disease ([place]) 11/28/2015   CKD (chronic kidney disease) stage 3, GFR 30-59 ml/min ([place]) 11/28/2015   Hypertension 12/04/2014   Hyperlipidemia 12/04/2014    Past Surgical History:  Procedure Laterality Date   COLONOSCOPY W/ POLYPECTOMY  05/16/1994   COLONOSCOPY W/ POLYPECTOMY  12/23/2011   adenomatous polyp   COLONOSCOPY WITH PROPOFOL N/A 08/12/2017   Procedure: COLONOSCOPY WITH PROPOFOL;  Surgeon: Manya Silvas, MD;  Location: [facility] ENDOSCOPY;  Service: Endoscopy;  Laterality: N/A;   KNEE SURGERY Left 2010   arthroscopic    Family History  Problem Relation Age of Onset   Cancer Mother        lung   Cancer Father        brain   Aneurysm Sister    Cancer Brother        pancreatic   Cancer  Brother        esophagus   Colon cancer Paternal Grandfather    Multiple sclerosis Daughter    Diabetes Daughter     Social History   Tobacco Use   Smoking status: Former    Packs/day: 2.00    Years: 40.00    Total pack years: 80.00    Types: Cigarettes    Start date: 1962    Quit date: 2002    Years since quitting: 21.7   Smokeless tobacco: Never   Tobacco comments:    quit in 2002. Has not resumed smoking since 2002. Pt does not require smoking cessation materials.  Substance Use Topics   Alcohol use: No    Alcohol/week: 0.0 standard drinks of alcohol     Current Outpatient Medications:    atorvastatin (LIPITOR) 40 MG tablet, Take 1 tablet (40 mg total) by mouth daily., Disp: 90 tablet, Rfl: 1   Cholecalciferol (VITAMIN D-3) 1000 units CAPS, Take 1 capsule (1,000 Units total) by mouth daily., Disp: 30 capsule, Rfl: 0   losartan (COZAAR)  100 MG tablet, Take 1 tablet (100 mg total) by mouth daily., Disp: 90 tablet, Rfl: 1   Omega-3 Fatty Acids (FISH OIL) 1000 MG CAPS, Take 1,000 mg by mouth daily. , Disp: , Rfl:   Allergies  Allergen Reactions   Ace Inhibitors     cough    I personally reviewed active problem list, medication list, allergies, family history, social history, health maintenance with the patient/caregiver today.   ROS  Ten systems reviewed and is negative except as mentioned in HPI   Objective  Vitals:   12/17/21 1055  BP: 138/84  Pulse: 75  Resp: 16  SpO2: 97%  Weight: 218 lb (98.9 kg)  Height: '5\' 4"'$  (1.626 m)    Body mass index is 37.42 kg/m.  Physical Exam  Constitutional: Patient appears well-developed and well-nourished. Obese  No distress.  HEENT: head atraumatic, normocephalic, pupils equal and reactive to light, neck supple Cardiovascular: Normal rate, regular rhythm and normal heart sounds.  No murmur heard. No BLE edema. Pulmonary/Chest: Effort normal and breath sounds normal. No respiratory distress. Abdominal: Soft.  There is no tenderness. Psychiatric: Patient has a normal mood and affect. behavior is normal. Judgment and thought content normal.   Recent Results (from the past 2160 hour(s))  POCT HgB A1C     Status: None   Collection Time: 12/17/21 11:04 AM  Result Value Ref Range   Hemoglobin A1C 5.6 4.0 - 5.6 %   HbA1c POC (<> result, manual entry)     HbA1c, POC (prediabetic range)     HbA1c, POC (controlled diabetic range)       PHQ2/9:    12/17/2021   11:03 AM 06/18/2021   10:46 AM 05/23/2021   10:36 AM 12/18/2020   10:36 AM 05/30/2020   10:16 AM  Depression screen PHQ 2/9  Decreased Interest 0 0 0 0 0  Down, Depressed, Hopeless 0 0 0 0 0  PHQ - 2 Score 0 0 0 0 0  Altered sleeping 0 0     Tired, decreased energy 0 0     Change in appetite 0 0     Feeling bad or failure about yourself  0 0     Trouble concentrating 0 0     Moving slowly or fidgety/restless 0  0     Suicidal thoughts 0 0     PHQ-9 Score 0 0  phq 9 is negative   Fall Risk:    12/17/2021   11:03 AM 06/18/2021   10:46 AM 05/23/2021   10:37 AM 12/18/2020   10:36 AM 05/30/2020   10:16 AM  Fall Risk   Falls in the past year? 0 0 0 0 0  Number falls in past yr: 0 0 0 0 0  Injury with Fall? 0 0 0 0 0  Risk for fall due to : Impaired balance/gait;Impaired mobility Impaired balance/gait;Impaired mobility Impaired balance/gait Impaired mobility;Impaired balance/gait   Follow up Falls prevention discussed Falls prevention discussed Falls prevention discussed Falls prevention discussed       Functional Status Survey: Is the patient deaf or have difficulty hearing?: Yes Does the patient have difficulty seeing, even when wearing glasses/contacts?: No Does the patient have difficulty concentrating, remembering, or making decisions?: No Does the patient have difficulty walking or climbing stairs?: Yes Does the patient have difficulty dressing or bathing?: No Does the patient have difficulty doing errands alone such as visiting a doctor's office or shopping?: No    Assessment & Plan  1. Controlled type 2 diabetes mellitus with stage 3 chronic kidney disease, without long-term current use of insulin (HCC)  - POCT HgB A1C - losartan (COZAAR) 100 MG tablet; Take 1 tablet (100 mg total) by mouth daily.  Dispense: 90 tablet; Refill: 1  2. Morbid obesity ([place])  Discussed with the patient the risk posed by an increased BMI. Discussed importance of portion control, calorie counting and at least 150 minutes of physical activity weekly. Avoid sweet beverages and drink more water. Eat at least 6 servings of fruit and vegetables daily    3. Dyslipidemia associated with type 2 diabetes mellitus (HCC)  - rosuvastatin (CRESTOR) 20 MG tablet; Take 1 tablet (20 mg total) by mouth daily.  Dispense: 90 tablet; Refill: 1  4. Essential hypertension  - losartan (COZAAR) 100 MG tablet; Take 1  tablet (100 mg total) by mouth daily.  Dispense: 90 tablet; Refill: 1  5. Stage 3a chronic kidney disease ([place])  Recheck next visit   6. Gait difficulty  Using a walker   7. Vitamin D deficiency   8. Primary osteoarthritis of both knees   9. Diabetes mellitus with microalbuminuria (HCC)  - losartan (COZAAR) 100 MG tablet; Take 1 tablet (100 mg total) by mouth daily.  Dispense: 90 tablet; Refill: 1  10. Exposure to mold   Advised to call the city to see if they can check it for her also

## 2021-12-17 ENCOUNTER — Ambulatory Visit (INDEPENDENT_AMBULATORY_CARE_PROVIDER_SITE_OTHER): Payer: Medicare Other | Admitting: Family Medicine

## 2021-12-17 ENCOUNTER — Encounter: Payer: Self-pay | Admitting: Family Medicine

## 2021-12-17 VITALS — BP 138/84 | HR 75 | Resp 16 | Ht 64.0 in | Wt 218.0 lb

## 2021-12-17 DIAGNOSIS — R809 Proteinuria, unspecified: Secondary | ICD-10-CM

## 2021-12-17 DIAGNOSIS — R269 Unspecified abnormalities of gait and mobility: Secondary | ICD-10-CM

## 2021-12-17 DIAGNOSIS — E559 Vitamin D deficiency, unspecified: Secondary | ICD-10-CM

## 2021-12-17 DIAGNOSIS — E1169 Type 2 diabetes mellitus with other specified complication: Secondary | ICD-10-CM

## 2021-12-17 DIAGNOSIS — M170 Bilateral primary osteoarthritis of knee: Secondary | ICD-10-CM

## 2021-12-17 DIAGNOSIS — N1831 Chronic kidney disease, stage 3a: Secondary | ICD-10-CM

## 2021-12-17 DIAGNOSIS — I10 Essential (primary) hypertension: Secondary | ICD-10-CM | POA: Diagnosis not present

## 2021-12-17 DIAGNOSIS — Z77120 Contact with and (suspected) exposure to mold (toxic): Secondary | ICD-10-CM

## 2021-12-17 DIAGNOSIS — E1129 Type 2 diabetes mellitus with other diabetic kidney complication: Secondary | ICD-10-CM

## 2021-12-17 DIAGNOSIS — N183 Chronic kidney disease, stage 3 unspecified: Secondary | ICD-10-CM

## 2021-12-17 DIAGNOSIS — E1122 Type 2 diabetes mellitus with diabetic chronic kidney disease: Secondary | ICD-10-CM | POA: Diagnosis not present

## 2021-12-17 DIAGNOSIS — E785 Hyperlipidemia, unspecified: Secondary | ICD-10-CM

## 2021-12-17 LAB — POCT GLYCOSYLATED HEMOGLOBIN (HGB A1C): Hemoglobin A1C: 5.6 % (ref 4.0–5.6)

## 2021-12-17 MED ORDER — LOSARTAN POTASSIUM 100 MG PO TABS: 100.0000 mg | ORAL_TABLET | Freq: Every day | ORAL | 1 refills | Status: DC

## 2021-12-17 MED ORDER — ROSUVASTATIN CALCIUM 20 MG PO TABS: 20.0000 mg | ORAL_TABLET | Freq: Every day | ORAL | 1 refills | Status: DC

## 2021-12-23 ENCOUNTER — Encounter: Payer: Self-pay | Admitting: Family Medicine

## 2022-02-11 ENCOUNTER — Other Ambulatory Visit: Payer: Self-pay | Admitting: Family Medicine

## 2022-02-11 DIAGNOSIS — E1169 Type 2 diabetes mellitus with other specified complication: Secondary | ICD-10-CM

## 2022-02-14 ENCOUNTER — Other Ambulatory Visit: Payer: Self-pay | Admitting: Family Medicine

## 2022-02-14 ENCOUNTER — Telehealth: Payer: Self-pay | Admitting: Family Medicine

## 2022-02-14 ENCOUNTER — Ambulatory Visit: Payer: Self-pay | Admitting: Family Medicine

## 2022-02-14 DIAGNOSIS — E1169 Type 2 diabetes mellitus with other specified complication: Secondary | ICD-10-CM

## 2022-02-14 NOTE — Telephone Encounter (Signed)
  Reason for Disposition  [1] Pharmacy calling with prescription question AND [2] triager unable to answer question  Answer Assessment - Initial Assessment Questions 1. NAME of MEDICINE: "What medicine(s) are you calling about?"     Melissa with [facility] called in. Atorvastatin and Crestor. 2. QUESTION: "What is your question?" (e.g., double dose of medicine, side effect)     Pt. Was told during last visit that her atorvastatin was going to be reduced however Crestor 20 mg was called into the pharmacy.   Needing clarification which drug pt to be on. 3. PRESCRIBER: "Who prescribed the medicine?" Reason: if prescribed by specialist, call should be referred to that group.     Dr. Steele Sizer 4. SYMPTOMS: "Do you have any symptoms?" If Yes, ask: "What symptoms are you having?"  "How bad are the symptoms (e.g., mild, moderate, severe)     N/A 5. PREGNANCY:  "Is there any chance that you are pregnant?" "When was your last menstrual period?"     N/A  Protocols used: Medication Question Call-A-AH

## 2022-02-14 NOTE — Telephone Encounter (Signed)
Tried Art therapist. It kept ringing for 4 minutes before call ended. Per Dr. Ancil Boozer: "she agrees on changing from Atorvastatin 40 mg to Rosuvastatin 20 mg and recheck labs next visit." Fill for Rosuvastatin '20mg'$  only.

## 2022-02-14 NOTE — Telephone Encounter (Signed)
Rx was changed to different medication  Requested Prescriptions  Pending Prescriptions Disp Refills   atorvastatin (LIPITOR) 40 MG tablet [Pharmacy Med Name: Atorvastatin Calcium 40 MG Oral Tablet] 90 tablet 0    Sig: Take 1 tablet by mouth once daily     Cardiovascular:  Antilipid - Statins Failed - 02/14/2022  5:17 PM      Failed - Lipid Panel in normal range within the last 12 months    Cholesterol, Total  Date Value Ref Range Status  08/28/2015 183 100 - 199 mg/dL Final   Cholesterol  Date Value Ref Range Status  06/18/2021 150 <200 mg/dL Final   LDL Cholesterol (Calc)  Date Value Ref Range Status  06/18/2021 83 mg/dL (calc) Final    Comment:    Reference range: <100 . Desirable range <100 mg/dL for primary prevention;   <70 mg/dL for patients with CHD or diabetic patients  with > or = 2 CHD risk factors. [name] LDL-C is now calculated using the Martin-Hopkins  calculation, which is a validated novel method providing  better accuracy than the Friedewald equation in the  estimation of LDL-C.  Cresenciano Genre et al. Annamaria Helling. 6195;093(26): 2061-2068  (http://education.[employer].com/faq/FAQ164)    HDL  Date Value Ref Range Status  06/18/2021 49 (L) > OR = 50 mg/dL Final  08/28/2015 50 >39 mg/dL Final   Triglycerides  Date Value Ref Range Status  06/18/2021 90 <150 mg/dL Final         Passed - Patient is not pregnant      Passed - Valid encounter within last 12 months    Recent Outpatient Visits           1 month ago Controlled type 2 diabetes mellitus with stage 3 chronic kidney disease, without long-term current use of insulin ([place])   [facility] [place], Drue Stager, MD   8 months ago Controlled type 2 diabetes mellitus with stage 3 chronic kidney disease, without long-term current use of insulin [facility])   [facility] [place], Drue Stager, MD   1 year ago Controlled type 2 diabetes mellitus with stage 3 chronic kidney disease, without  long-term current use of insulin [facility])   [facility] [place], Drue Stager, MD   1 year ago Controlled type 2 diabetes mellitus with stage 3 chronic kidney disease, without long-term current use of insulin [facility])   [facility] [place], Drue Stager, MD   2 years ago Controlled type 2 diabetes mellitus with stage 3 chronic kidney disease, without long-term current use of insulin [facility])   [facility] Steele Sizer, MD       Future Appointments             In 3 months  [place]   In 4 months Steele Sizer, MD [facility], [facility]

## 2022-02-14 NOTE — Telephone Encounter (Signed)
  Chief Complaint: Pharmacy needing clarification on cholesterol medication.   Call [facility] at [place] [phone]. Symptoms: N/A Frequency: N/A Pertinent Negatives: Patient denies N/A Disposition: '[]'$ ED /'[]'$ Urgent Care (no appt availability in office) / '[]'$ Appointment(In office/virtual)/ '[]'$  [place] [facility]/ '[]'$ Home Care/ '[]'$ Refused Recommended Disposition /'[]'$ [place] [facility]/ '[x]'$  Follow-up with PCP Additional Notes: See triage note.

## 2022-02-16 ENCOUNTER — Other Ambulatory Visit: Payer: Self-pay | Admitting: Family Medicine

## 2022-02-16 DIAGNOSIS — E1169 Type 2 diabetes mellitus with other specified complication: Secondary | ICD-10-CM

## 2022-02-17 NOTE — Telephone Encounter (Signed)
Called patient and made aware of Rosuvastatin '20mg'$  being sent in. She stated she will contact pharmacy and get that dose filled.

## 2022-02-17 NOTE — Telephone Encounter (Signed)
Pt is calling to report that she has been out of the medication since Friday. Pt was advised the last time she was in the office that Dr. Ancil Boozer was going to change the mg to 20. Please advise with the pt CB- [phone]

## 2022-05-27 ENCOUNTER — Ambulatory Visit: Payer: Medicare Other

## 2022-05-30 ENCOUNTER — Ambulatory Visit (INDEPENDENT_AMBULATORY_CARE_PROVIDER_SITE_OTHER): Payer: Medicare Other

## 2022-05-30 VITALS — BP 112/62 | Ht 64.0 in | Wt 216.4 lb

## 2022-05-30 DIAGNOSIS — Z0000 Encounter for general adult medical examination without abnormal findings: Secondary | ICD-10-CM

## 2022-05-30 NOTE — Progress Notes (Signed)
Subjective:   Tammy Lozano is a 82 y.o. female who presents for Medicare Annual (Subsequent) preventive examination.  Review of Systems    Cardiac Risk Factors include: advanced age (>38mn, >>30women);dyslipidemia;hypertension;obesity (BMI >30kg/m2);sedentary lifestyle    Objective:    Today's Vitals   05/30/22 1438 05/30/22 1439  Weight: 216 lb 6.4 oz (98.2 kg)   Height: '5\' 4"'$  (1.626 m)   PainSc:  5    Body mass index is 37.14 kg/m.     05/30/2022    2:55 PM 05/23/2021   10:36 AM 05/22/2020   10:23 AM 04/22/2019    8:46 AM 03/29/2018   11:25 AM 03/23/2018   11:22 AM 12/26/2017   12:40 PM  Advanced Directives  Does Patient Have a Medical Advance Directive? No No No No No No No  Would patient like information on creating a medical advance directive? Yes (ED - Information included in AVS) No - Patient declined No - Patient declined No - Patient declined No - Patient declined Yes (MAU/Ambulatory/Procedural Areas - Information given)     Current Medications (verified) Outpatient Encounter Medications as of 05/30/2022  Medication Sig   Cholecalciferol (VITAMIN D-3) 1000 units CAPS Take 1 capsule (1,000 Units total) by mouth daily.   losartan (COZAAR) 100 MG tablet Take 1 tablet (100 mg total) by mouth daily.   Omega-3 Fatty Acids (FISH OIL) 1000 MG CAPS Take 1,000 mg by mouth daily.    rosuvastatin (CRESTOR) 20 MG tablet Take 1 tablet (20 mg total) by mouth daily.   No facility-administered encounter medications on file as of 05/30/2022.    Allergies (verified) Ace inhibitors   History: Past Medical History:  Diagnosis Date   Chronic kidney disease    stage 3   Hyperlipidemia    Hypertension    Osteoarthritis    Past Surgical History:  Procedure Laterality Date   COLONOSCOPY W/ POLYPECTOMY  05/16/1994   COLONOSCOPY W/ POLYPECTOMY  12/23/2011   adenomatous polyp   COLONOSCOPY WITH PROPOFOL N/A 08/12/2017   Procedure: COLONOSCOPY WITH PROPOFOL;  Surgeon: EManya Silvas MD;  [facility]  Service: Endoscopy;  Laterality: N/A;   KNEE SURGERY Left 2010   arthroscopic   Family History  Problem Relation Age of Onset   Cancer Mother        lung   Cancer Father        brain   Aneurysm Sister    Cancer Brother        pancreatic   Cancer Brother        esophagus   Colon cancer Paternal Grandfather    Multiple sclerosis Daughter    Diabetes Daughter    Social History   Socioeconomic History   Marital status: Divorced    Spouse name: Not on file   Number of children: 3   Years of education: 12   Highest education level: 12th grade  Occupational History    Employer: RETIRED  Tobacco Use   Smoking status: Former    Packs/day: 2.00    Years: 40.00    Total pack years: 80.00    Types: Cigarettes    Start date: 1962    Quit date: 2002    Years since quitting: 22.1   Smokeless tobacco: Never   Tobacco comments:    quit in 2002. Has not resumed smoking since 2002. Pt does not require smoking cessation materials.  Vaping Use   Vaping Use: Never used  Substance and Sexual Activity  Alcohol use: No    Alcohol/week: 0.0 standard drinks of alcohol   Drug use: No   Sexual activity: Not Currently  Other Topics Concern   Not on file  Social History Narrative   Her 68 great-grandson lives with her, her grandson and his wife live in the same apartment complex    Social Determinants of Health   Financial Resource Strain: Low Risk  (05/30/2022)   Overall Financial Resource Strain (CARDIA)    Difficulty of Paying Living Expenses: Not hard at all  Food Insecurity: No Food Insecurity (05/30/2022)   Hunger Vital Sign    Worried About Running Out of Food in the Last Year: Never true    Ran Out of Food in the Last Year: Never true  Transportation Needs: No Transportation Needs (05/30/2022)   PRAPARE - Hydrologist (Medical): No    Lack of Transportation (Non-Medical): No  Physical Activity: Inactive  (05/30/2022)   Exercise Vital Sign    Days of Exercise per Week: 0 days    Minutes of Exercise per Session: 0 min  Stress: No Stress Concern Present (05/30/2022)   [place]    Feeling of Stress : Not at all  Social Connections: Socially Isolated (05/30/2022)   Social Connection and Isolation Panel [NHANES]    Frequency of Communication with Friends and Family: More than three times a week    Frequency of Social Gatherings with Friends and Family: More than three times a week    Attends Religious Services: Never    Marine scientist or Organizations: No    Attends Archivist Meetings: Never    Marital Status: Divorced    Tobacco Counseling Counseling given: Not Answered Tobacco comments: quit in 2002. Has not resumed smoking since 2002. Pt does not require smoking cessation materials.   Clinical Intake:  Pre-visit preparation completed: Yes  Pain : 0-10 Pain Score: 5  Pain Type: Chronic pain Pain Location: Knee (both) Pain Descriptors / Indicators: Aching Pain Onset: More than a month ago Pain Frequency: Constant Pain Relieving Factors: tylenol  Pain Relieving Factors: tylenol  BMI - recorded: 37.14 Nutritional Status: BMI > 30  Obese Nutritional Risks: None Diabetes: Yes  How often do you need to have someone help you when you read instructions, pamphlets, or other written materials from your doctor or pharmacy?: 1 - Never  Diabetic?no  Interpreter Needed?: No  Information entered by :: B.Keithon Mccoin,LPN   Activities of Daily Living    05/30/2022    2:56 PM 12/17/2021   11:03 AM  In your present state of health, do you have any difficulty performing the following activities:  Hearing? 0 1  Vision? 0 0  Difficulty concentrating or making decisions? 0 0  Walking or climbing stairs? 1 1  Dressing or bathing? 0 0  Doing errands, shopping? 1 0  Comment great grandson and grandson help  pt   Conservation officer, nature and eating ? N   Using the Toilet? N   In the past six months, have you accidently leaked urine? N   Do you have problems with loss of bowel control? N   Managing your Medications? N   Managing your Finances? N   Housekeeping or managing your Housekeeping? N     Patient Care Team: Steele Sizer, MD as PCP - General (Family Medicine)  Indicate any recent Medical Services you may have received from other than [facility] providers in  the past year (date may be approximate).     Assessment:   This is a routine wellness examination for [place].  Hearing/Vision screen Hearing Screening - Comments:: Adequate hearing Vision Screening - Comments:: Adequate vision w/glasses [place]-appt 06/04/2022  Dietary issues and exercise activities discussed: Current Exercise Habits: The patient does not participate in regular exercise at present, Exercise limited by: orthopedic condition(s)   Goals Addressed   None    Depression Screen    05/30/2022    2:49 PM 12/17/2021   11:03 AM 06/18/2021   10:46 AM 05/23/2021   10:36 AM 12/18/2020   10:36 AM 05/30/2020   10:16 AM 05/22/2020   10:23 AM  PHQ 2/9 Scores  PHQ - 2 Score 0 0 0 0 0 0 0  PHQ- 9 Score  0 0        Fall Risk    05/30/2022    2:46 PM 12/17/2021   11:03 AM 06/18/2021   10:46 AM 05/23/2021   10:37 AM 12/18/2020   10:36 AM  Fall Risk   Falls in the past year? 0 0 0 0 0  Number falls in past yr: 0 0 0 0 0  Injury with Fall? 0 0 0 0 0  Risk for fall due to : No Fall Risks Impaired balance/gait;Impaired mobility Impaired balance/gait;Impaired mobility Impaired balance/gait Impaired mobility;Impaired balance/gait  Follow up Education provided;Falls prevention discussed Falls prevention discussed Falls prevention discussed Falls prevention discussed Falls prevention discussed    FALL RISK PREVENTION PERTAINING TO THE HOME:  Any stairs in or around the home? No  If so, are there any without handrails? No   Home free of loose throw rugs in walkways, pet beds, electrical cords, etc? Yes  Adequate lighting in your home to reduce risk of falls? Yes   ASSISTIVE DEVICES UTILIZED TO PREVENT FALLS:  Life alert? No  Use of a cane, walker or w/c? Yes walker Grab bars in the bathroom? Yes  Shower chair or bench in shower? Yes  Elevated toilet seat or a handicapped toilet? Yes   TIMED UP AND GO:  Was the test performed? Yes .  Length of time to ambulate 10 feet: 25 sec.   Gait slow and steady with assistive device  Cognitive Function:        05/30/2022    2:58 PM 04/22/2019    8:49 AM 03/23/2018   11:37 AM 03/20/2017   10:52 AM 03/17/2016    3:06 PM  6CIT Screen  What Year? 0 points 0 points 0 points 0 points 0 points  What month? 0 points 0 points 0 points 0 points 0 points  What time? 0 points 0 points 0 points 0 points 0 points  Count back from 20 0 points 0 points 0 points 0 points 0 points  Months in reverse 0 points 0 points 0 points 0 points 0 points  Repeat phrase 0 points 0 points 0 points 2 points 0 points  Total Score 0 points 0 points 0 points 2 points 0 points    Immunizations Immunization History  Administered Date(s) Administered   PFIZER(Purple Top)SARS-COV-2 Vaccination 06/23/2019, 07/19/2019, 03/09/2020   Pneumococcal Conjugate-13 02/28/2019   Pneumococcal Polysaccharide-23 11/04/2017   Tdap 01/24/2014    TDAP status: Up to date  Flu Vaccine status: Declined, Education has been provided regarding the importance of this vaccine but patient still declined. Advised may receive this vaccine at local pharmacy or Health Dept. Aware to provide a copy of the vaccination  record if obtained from local pharmacy or Health Dept. Verbalized acceptance and understanding.  Pneumococcal vaccine status: Up to date  Covid-19 vaccine status: Completed vaccines  Qualifies for Shingles Vaccine? Yes   Zostavax completed No   Shingrix Completed?: No.    Education has been  provided regarding the importance of this vaccine. Patient has been advised to call insurance company to determine out of pocket expense if they have not yet received this vaccine. Advised may also receive vaccine at local pharmacy or Health Dept. Verbalized acceptance and understanding.  Screening Tests Health Maintenance  Topic Date Due   MAMMOGRAM  Never done   Zoster Vaccines- Shingrix (1 of 2) Never done   DEXA SCAN  Never done   COVID-19 Vaccine (4 - 2023-24 season) 12/06/2021   Diabetic kidney evaluation - eGFR measurement  06/19/2022   Diabetic kidney evaluation - Urine ACR  06/19/2022   OPHTHALMOLOGY EXAM  06/03/2022   HEMOGLOBIN A1C  06/17/2022   FOOT EXAM  06/19/2022   COLONOSCOPY (Pts 45-61yr Insurance coverage will need to be confirmed)  08/13/2022   Medicare Annual Wellness (AWV)  05/31/2023   DTaP/Tdap/Td (2 - Td or Tdap) 01/25/2024   Pneumonia Vaccine 82 Years old  Completed   HPV VACCINES  Aged Out   INFLUENZA VACCINE  Discontinued    Health Maintenance  Health Maintenance Due  Topic Date Due   MAMMOGRAM  Never done   Zoster Vaccines- Shingrix (1 of 2) Never done   DEXA SCAN  Never done   COVID-19 Vaccine (4 - 2023-24 season) 12/06/2021   Diabetic kidney evaluation - eGFR measurement  06/19/2022   Diabetic kidney evaluation - Urine ACR  06/19/2022    Colorectal cancer screening: No longer required.   Mammogram status: No longer required due to age.  Bone Density- never done  Lung Cancer Screening: (Low Dose CT Chest recommended if Age 83-80years, 30 pack-year currently smoking OR have quit w/in 15years.) does not qualify.   Lung Cancer Screening Referral: no  Additional Screening:  Hepatitis C Screening: does not qualify; Completed yes  Vision Screening: Recommended annual ophthalmology exams for early detection of glaucoma and other disorders of the eye. Is the patient up to date with their annual eye exam?  Yes  Who is the provider or what is  the name of the office in which the patient attends annual eye exams? [facility] pt is not established with a provider, would they like to be referred to a provider to establish care? No .   Dental Screening: Recommended annual dental exams for proper oral hygiene  Community Resource Referral / Chronic Care Management: CRR required this visit?  No   CCM required this visit?  No      Plan:     I have personally reviewed and noted the following in the patient's chart:   Medical and social history Use of alcohol, tobacco or illicit drugs  Current medications and supplements including opioid prescriptions. Patient is not currently taking opioid prescriptions. Functional ability and status Nutritional status Physical activity Advanced directives List of other physicians Hospitalizations, surgeries, and ER visits in previous 12 months Vitals Screenings to include cognitive, depression, and falls Referrals and appointments  In addition, I have reviewed and discussed with patient certain preventive protocols, quality metrics, and best practice recommendations. A written personalized care plan for preventive services as well as general preventive health recommendations were provided to patient.    [facility] LPN  05/30/2022   Nurse Notes: pt states she is doing well, She has no concerns or questions at this time.

## 2022-06-04 DIAGNOSIS — H2513 Age-related nuclear cataract, bilateral: Secondary | ICD-10-CM | POA: Diagnosis not present

## 2022-06-04 LAB — HM DIABETES EYE EXAM

## 2022-06-16 NOTE — Progress Notes (Unsigned)
Name: Tammy Lozano   MRN: [id number]    DOB: [birth year]   Date:06/17/2022       Progress Note  Subjective  Chief Complaint  Follow Up  HPI  Diabetes type II: diagnosed October 2018 with hgbA1C of 6.6%, A1C has been at goal since, today it is 6%  She denies polyphagia, polydipsia or polyuria. She is obese, her weight has been stable over the past year .  She has obesity and also CKI and is on ARB, urine micro used to be elevated but back to normal range, she has CKI stage III and we will recheck labs today  She also has dyslipidemia and is on statin therapy, last LDL was slightly above goal at 83  . Eye exam is up to date, she had it done02/2024    Dyslipidemia Last LDL was 83, explained goal is below 70 and we changed from Atorvastatin to Rosuvastatin and she will have repeat labs    HTN: she takes losartan 100 mg daily , states ACE caused a cough, no chest pain or palpitation, BP s towards low end of normal but she denies orthostatic changes   OA: left hip and both  knee  .She is no longer taking NSAID's , only on Tylenol prn, she has  CKI stage III, she uses a walker all the time, inside and outside the home, she has difficulty getting from chairs and also toilet, she has tub bench - she never takes a shower without a relative at home    Morbid obesity: BMI above 35 with co-morbidities. she states her weight as an adult was 158 lbs, after third child weight was 132 lbs and remained that way until 1982 after she lost her mother, and worse since 1991 when her sister diet, she went above 200 lbs , weight has been stable for the past year   CKI: she used to see Dr. Candiss Norse,  she has a history of  microalbuminuria at 26 , now on ARB, GFR was in the 40's and stable, last urine micro back to normal. She denies pruritis and has normal urine output ,she has not been taking NSAID's   Insomnia: she spends a lot of time watching TV and staying in her bed during the day due to OA and pain, therefore her  sleep is very interrupted at night. She also uses her phone in the middle of the night. Discussed sleep hygiene    Patient Active Problem List   Diagnosis Date Noted   Dyslipidemia associated with type 2 diabetes mellitus ([place]) 06/18/2021   Diabetes mellitus with microalbuminuria ([place]) 06/18/2021   Morbid obesity ([place]) 11/04/2017   Osteopenia of left hip 06/01/2017   Controlled type 2 diabetes mellitus with chronic kidney disease ([place]) 11/28/2015   CKD (chronic kidney disease) stage 3, GFR 30-59 ml/min ([place]) 11/28/2015   Hypertension 12/04/2014   Hyperlipidemia 12/04/2014    Past Surgical History:  Procedure Laterality Date   COLONOSCOPY W/ POLYPECTOMY  05/16/1994   COLONOSCOPY W/ POLYPECTOMY  12/23/2011   adenomatous polyp   COLONOSCOPY WITH PROPOFOL N/A 08/12/2017   Procedure: COLONOSCOPY WITH PROPOFOL;  Surgeon: Manya Silvas, MD;  [facility];  Service: Endoscopy;  Laterality: N/A;   KNEE SURGERY Left 2010   arthroscopic    Family History  Problem Relation Age of Onset   Cancer Mother        lung   Cancer Father        brain   Aneurysm Sister  Cancer Brother        pancreatic   Cancer Brother        esophagus   Colon cancer Paternal Grandfather    Multiple sclerosis Daughter    Diabetes Daughter     Social History   Tobacco Use   Smoking status: Former    Packs/day: 2.00    Years: 40.00    Total pack years: 80.00    Types: Cigarettes    Start date: 20    Quit date: 2002    Years since quitting: 22.2   Smokeless tobacco: Never   Tobacco comments:    quit in 2002. Has not resumed smoking since 2002. Pt does not require smoking cessation materials.  Substance Use Topics   Alcohol use: No    Alcohol/week: 0.0 standard drinks of alcohol     Current Outpatient Medications:    Cholecalciferol (VITAMIN D-3) 1000 units CAPS, Take 1 capsule (1,000 Units total) by mouth daily., Disp: 30 capsule, Rfl: 0   losartan (COZAAR) 100 MG tablet, Take  1 tablet (100 mg total) by mouth daily., Disp: 90 tablet, Rfl: 1   Omega-3 Fatty Acids (FISH OIL) 1000 MG CAPS, Take 1,000 mg by mouth daily. , Disp: , Rfl:    rosuvastatin (CRESTOR) 20 MG tablet, Take 1 tablet (20 mg total) by mouth daily., Disp: 90 tablet, Rfl: 1  Allergies  Allergen Reactions   Ace Inhibitors     cough    I personally reviewed active problem list, medication list, allergies, family history, social history, health maintenance with the patient/caregiver today.   ROS  Constitutional: Negative for fever or weight change.  Respiratory: Negative for cough and shortness of breath.   Cardiovascular: Negative for chest pain or palpitations.  Gastrointestinal: Negative for abdominal pain, no bowel changes.  Musculoskeletal: positive  for gait problem , she denies   joint swelling.  Skin: Negative for rash.  Neurological: Negative for dizziness or headache.  No other specific complaints in a complete review of systems (except as listed in HPI above).   Objective  Vitals:   06/17/22 1123  BP: 126/64  Pulse: 90  Resp: 16  Temp: 97.9 F (36.6 C)  TempSrc: Oral  SpO2: 95%  Weight: 219 lb 3.2 oz (99.4 kg)  Height: '5\' 4"'$  (1.626 m)    Body mass index is 37.63 kg/m.  Physical Exam  Constitutional: Patient appears well-developed and well-nourished. Obese  No distress.  HEENT: head atraumatic, normocephalic, pupils equal and reactive to light, neck supple Cardiovascular: Normal rate, regular rhythm and normal heart sounds.  No murmur heard. No BLE edema. Pulmonary/Chest: Effort normal and breath sounds normal. No respiratory distress. Abdominal: Soft.  There is no tenderness. Psychiatric: Patient has a normal mood and affect. behavior is normal. Judgment and thought content normal.   Recent Results (from the past 2160 hour(s))  POCT HgB A1C     Status: Abnormal   Collection Time: 06/17/22 11:46 AM  Result Value Ref Range   Hemoglobin A1C 6.0 (A) 4.0 - 5.6 %    HbA1c POC (<> result, manual entry)     HbA1c, POC (prediabetic range)     HbA1c, POC (controlled diabetic range)      Diabetic Foot Exam: Diabetic Foot Exam - Simple   Simple Foot Form Visual Inspection See comments: Yes Sensation Testing Intact to touch and monofilament testing bilaterally: Yes Pulse Check Posterior Tibialis and Dorsalis pulse intact bilaterally: Yes Comments Dry skin, bunions  PHQ2/9:    06/17/2022   11:45 AM 05/30/2022    2:49 PM 12/17/2021   11:03 AM 06/18/2021   10:46 AM 05/23/2021   10:36 AM  Depression screen PHQ 2/9  Decreased Interest 0 0 0 0 0  Down, Depressed, Hopeless 0 0 0 0 0  PHQ - 2 Score 0 0 0 0 0  Altered sleeping 0  0 0   Tired, decreased energy 0  0 0   Change in appetite 0  0 0   Feeling bad or failure about yourself  0  0 0   Trouble concentrating 0  0 0   Moving slowly or fidgety/restless 0  0 0   Suicidal thoughts 0  0 0   PHQ-9 Score 0  0 0     phq 9 is negative   Fall Risk:    06/17/2022   11:45 AM 05/30/2022    2:46 PM 12/17/2021   11:03 AM 06/18/2021   10:46 AM 05/23/2021   10:37 AM  Fall Risk   Falls in the past year? 0 0 0 0 0  Number falls in past yr:  0 0 0 0  Injury with Fall?  0 0 0 0  Risk for fall due to : Impaired balance/gait No Fall Risks Impaired balance/gait;Impaired mobility Impaired balance/gait;Impaired mobility Impaired balance/gait  Follow up Falls prevention discussed;Education provided;Falls evaluation completed Education provided;Falls prevention discussed Falls prevention discussed Falls prevention discussed Falls prevention discussed      Functional Status Survey: Is the patient deaf or have difficulty hearing?: No Does the patient have difficulty seeing, even when wearing glasses/contacts?: No Does the patient have difficulty walking or climbing stairs?: Yes Does the patient have difficulty dressing or bathing?: No Does the patient have difficulty doing errands alone such as visiting a  doctor's office or shopping?: Yes    Assessment & Plan  1. Dyslipidemia associated with type 2 diabetes mellitus (HCC)  - POCT HgB A1C - Urine Microalbumin w/creat. ratio - Lipid panel  2. Morbid obesity ([place])  Weight is stable   3. Stage 3a chronic kidney disease (HCC)  - CBC with Differential/Platelet - Comprehensive Metabolic Panel (CMET)  4. Diabetes mellitus with microalbuminuria ([place])   5. Controlled type 2 diabetes mellitus with stage 3 chronic kidney disease, without long-term current use of insulin (HCC)  On ARB  6. Primary osteoarthritis of both knees  Stable  7. Gait difficulty  Using a walker   8. Essential hypertension  - Comprehensive Metabolic Panel (CMET)  9. Vitamin D deficiency  - VITAMIN D 25 Hydroxy (Vit-D Deficiency, Fractures)

## 2022-06-17 ENCOUNTER — Ambulatory Visit (INDEPENDENT_AMBULATORY_CARE_PROVIDER_SITE_OTHER): Payer: Medicare Other | Admitting: Family Medicine

## 2022-06-17 ENCOUNTER — Encounter: Payer: Self-pay | Admitting: Family Medicine

## 2022-06-17 VITALS — BP 126/64 | HR 90 | Temp 97.9°F | Resp 16 | Ht 64.0 in | Wt 219.2 lb

## 2022-06-17 DIAGNOSIS — E1169 Type 2 diabetes mellitus with other specified complication: Secondary | ICD-10-CM

## 2022-06-17 DIAGNOSIS — M170 Bilateral primary osteoarthritis of knee: Secondary | ICD-10-CM

## 2022-06-17 DIAGNOSIS — R809 Proteinuria, unspecified: Secondary | ICD-10-CM | POA: Diagnosis not present

## 2022-06-17 DIAGNOSIS — E1122 Type 2 diabetes mellitus with diabetic chronic kidney disease: Secondary | ICD-10-CM

## 2022-06-17 DIAGNOSIS — E559 Vitamin D deficiency, unspecified: Secondary | ICD-10-CM

## 2022-06-17 DIAGNOSIS — N1831 Chronic kidney disease, stage 3a: Secondary | ICD-10-CM | POA: Diagnosis not present

## 2022-06-17 DIAGNOSIS — N183 Chronic kidney disease, stage 3 unspecified: Secondary | ICD-10-CM

## 2022-06-17 DIAGNOSIS — E1129 Type 2 diabetes mellitus with other diabetic kidney complication: Secondary | ICD-10-CM

## 2022-06-17 DIAGNOSIS — R269 Unspecified abnormalities of gait and mobility: Secondary | ICD-10-CM

## 2022-06-17 DIAGNOSIS — E785 Hyperlipidemia, unspecified: Secondary | ICD-10-CM

## 2022-06-17 DIAGNOSIS — I10 Essential (primary) hypertension: Secondary | ICD-10-CM | POA: Diagnosis not present

## 2022-06-17 LAB — POCT GLYCOSYLATED HEMOGLOBIN (HGB A1C): Hemoglobin A1C: 6 % — AB (ref 4.0–5.6)

## 2022-07-07 ENCOUNTER — Other Ambulatory Visit: Payer: Self-pay | Admitting: Family Medicine

## 2022-07-07 DIAGNOSIS — I10 Essential (primary) hypertension: Secondary | ICD-10-CM

## 2022-07-07 DIAGNOSIS — E1122 Type 2 diabetes mellitus with diabetic chronic kidney disease: Secondary | ICD-10-CM

## 2022-07-07 DIAGNOSIS — E1129 Type 2 diabetes mellitus with other diabetic kidney complication: Secondary | ICD-10-CM

## 2022-07-10 ENCOUNTER — Other Ambulatory Visit: Payer: Self-pay | Admitting: Family Medicine

## 2022-07-10 DIAGNOSIS — E1129 Type 2 diabetes mellitus with other diabetic kidney complication: Secondary | ICD-10-CM

## 2022-07-10 DIAGNOSIS — N183 Chronic kidney disease, stage 3 unspecified: Secondary | ICD-10-CM

## 2022-07-10 DIAGNOSIS — I10 Essential (primary) hypertension: Secondary | ICD-10-CM

## 2022-07-10 NOTE — Telephone Encounter (Unsigned)
Copied from [place] [phone]. Topic: General - Other >> Jul 10, 2022 12:22 PM Everette C wrote: Reason for CRM: Medication Refill - Medication: losartan (COZAAR) 100 MG tablet [id number]  Has the patient contacted their pharmacy? Yes.   (Agent: If no, request that the patient contact the pharmacy for the refill. If patient does not wish to contact the pharmacy document the reason why and proceed with request.) (Agent: If yes, when and what did the pharmacy advise?)  Preferred Pharmacy (with phone number or street name): [place], [place] - [address] [place] [address] Phone: [phone] Fax: [phone] Hours: Not open 24 hours   Has the patient been seen for an appointment in the last year OR does the patient have an upcoming appointment? Yes.    Agent: Please be advised that RX refills may take up to 3 business days. We ask that you follow-up with your pharmacy.

## 2022-07-10 NOTE — Telephone Encounter (Signed)
Refill 07/07/22 #90. Requested Prescriptions  Refused Prescriptions Disp Refills   losartan (COZAAR) 100 MG tablet 90 tablet 0    Sig: Take 1 tablet (100 mg total) by mouth daily.     Cardiovascular:  Angiotensin Receptor Blockers Failed - 07/10/2022  2:00 PM      Failed - Cr in normal range and within 180 days    Creat  Date Value Ref Range Status  06/18/2021 1.25 (H) 0.60 - 0.95 mg/dL Final   Creatinine, Urine  Date Value Ref Range Status  06/18/2021 169 20 - 275 mg/dL Final         Failed - K in normal range and within 180 days    Potassium  Date Value Ref Range Status  06/18/2021 4.3 3.5 - 5.3 mmol/L Final         Passed - Patient is not pregnant      Passed - Last BP in normal range    BP Readings from Last 1 Encounters:  06/17/22 126/64         Passed - Valid encounter within last 6 months    Recent Outpatient Visits           3 weeks ago Dyslipidemia associated with type 2 diabetes mellitus [facility])   [facility] Steele Sizer, MD   6 months ago Controlled type 2 diabetes mellitus with stage 3 chronic kidney disease, without long-term current use of insulin [facility])   [facility] [place], Drue Stager, MD   1 year ago Controlled type 2 diabetes mellitus with stage 3 chronic kidney disease, without long-term current use of insulin [facility])   [facility] [place], Drue Stager, MD   1 year ago Controlled type 2 diabetes mellitus with stage 3 chronic kidney disease, without long-term current use of insulin [facility])   [facility] [place], Drue Stager, MD   2 years ago Controlled type 2 diabetes mellitus with stage 3 chronic kidney disease, without long-term current use of insulin [facility])   [facility] Steele Sizer, MD       Future Appointments             In 2 months Ancil Boozer, Drue Stager, MD [facility], [place]   In 5 months Steele Sizer,  MD [facility], [facility]

## 2022-08-04 ENCOUNTER — Other Ambulatory Visit: Payer: Self-pay | Admitting: Family Medicine

## 2022-08-04 DIAGNOSIS — E1169 Type 2 diabetes mellitus with other specified complication: Secondary | ICD-10-CM

## 2022-08-04 NOTE — Telephone Encounter (Signed)
Medication Refill - Medication: rosuvastatin (CRESTOR) 20 MG tablet   Has the patient contacted their pharmacy? No. (Agent: If no, request that the patient contact the pharmacy for the refill. If patient does not wish to contact the pharmacy document the reason why and proceed with request.) (Agent: If yes, when and what did the pharmacy advise?)  Preferred Pharmacy (with phone number or street name):  [facility] [address] [place], [place] - [address] Phone: [phone]  Fax: [phone]     Has the patient been seen for an appointment in the last year OR does the patient have an upcoming appointment? Yes.    Agent: Please be advised that RX refills may take up to 3 business days. We ask that you follow-up with your pharmacy.

## 2022-08-05 ENCOUNTER — Other Ambulatory Visit: Payer: Self-pay

## 2022-08-05 DIAGNOSIS — E1169 Type 2 diabetes mellitus with other specified complication: Secondary | ICD-10-CM

## 2022-08-05 MED ORDER — ROSUVASTATIN CALCIUM 20 MG PO TABS: 20.0000 mg | ORAL_TABLET | Freq: Every day | ORAL | 0 refills | Status: DC

## 2022-08-05 NOTE — Telephone Encounter (Signed)
Requested medication (s) are due for refill today: yes  Requested medication (s) are on the active medication list: yes  Last refill:  12/17/21 #90/1  Future visit scheduled: yes  Notes to clinic:  Unable to refill per protocol due to failed labs, no updated results.     Requested Prescriptions  Pending Prescriptions Disp Refills   rosuvastatin (CRESTOR) 20 MG tablet 90 tablet 1    Sig: Take 1 tablet (20 mg total) by mouth daily.     Cardiovascular:  Antilipid - Statins 2 Failed - 08/04/2022  1:15 PM      Failed - Cr in normal range and within 360 days    Creat  Date Value Ref Range Status  06/18/2021 1.25 (H) 0.60 - 0.95 mg/dL Final   Creatinine, Urine  Date Value Ref Range Status  06/18/2021 169 20 - 275 mg/dL Final         Failed - Lipid Panel in normal range within the last 12 months    Cholesterol, Total  Date Value Ref Range Status  08/28/2015 183 100 - 199 mg/dL Final   Cholesterol  Date Value Ref Range Status  06/18/2021 150 <200 mg/dL Final   LDL Cholesterol (Calc)  Date Value Ref Range Status  06/18/2021 83 mg/dL (calc) Final    Comment:    Reference range: <100 . Desirable range <100 mg/dL for primary prevention;   <70 mg/dL for patients with CHD or diabetic patients  with > or = 2 CHD risk factors. [name] LDL-C is now calculated using the Martin-Hopkins  calculation, which is a validated novel method providing  better accuracy than the Friedewald equation in the  estimation of LDL-C.  Horald Pollen et al. Lenox Ahr. 1610;960(45): 2061-2068  (http://education.[facility].com/faq/FAQ164)    HDL  Date Value Ref Range Status  06/18/2021 49 (L) > OR = 50 mg/dL Final  40/98/1191 50 >47 mg/dL Final   Triglycerides  Date Value Ref Range Status  06/18/2021 90 <150 mg/dL Final         Passed - Patient is not pregnant      Passed - Valid encounter within last 12 months    Recent Outpatient Visits           1 month ago Dyslipidemia associated with type 2  diabetes mellitus [facility])   [place] [facility] Alba Cory, MD   7 months ago Controlled type 2 diabetes mellitus with stage 3 chronic kidney disease, without long-term current use of insulin [facility])   [place] [facility] [place], Danna Hefty, MD   1 year ago Controlled type 2 diabetes mellitus with stage 3 chronic kidney disease, without long-term current use of insulin [facility])   [place] [facility] [place], Danna Hefty, MD   1 year ago Controlled type 2 diabetes mellitus with stage 3 chronic kidney disease, without long-term current use of insulin [facility])   [place] [facility] Alba Cory, MD   2 years ago Controlled type 2 diabetes mellitus with stage 3 chronic kidney disease, without long-term current use of insulin [facility])   [place] [facility] Alba Cory, MD       Future Appointments             In 1 month Carlynn Purl, Danna Hefty, MD [facility], [facility]   In 4 months Alba Cory, MD [facility], [facility]

## 2022-09-23 ENCOUNTER — Encounter: Payer: Medicare Other | Admitting: Family Medicine

## 2022-10-07 ENCOUNTER — Other Ambulatory Visit: Payer: Self-pay | Admitting: Family Medicine

## 2022-10-07 DIAGNOSIS — E1122 Type 2 diabetes mellitus with diabetic chronic kidney disease: Secondary | ICD-10-CM

## 2022-10-07 DIAGNOSIS — E1129 Type 2 diabetes mellitus with other diabetic kidney complication: Secondary | ICD-10-CM

## 2022-10-07 DIAGNOSIS — I10 Essential (primary) hypertension: Secondary | ICD-10-CM

## 2022-10-10 ENCOUNTER — Other Ambulatory Visit: Payer: Self-pay | Admitting: Family Medicine

## 2022-10-10 DIAGNOSIS — N183 Chronic kidney disease, stage 3 unspecified: Secondary | ICD-10-CM

## 2022-10-10 DIAGNOSIS — E1129 Type 2 diabetes mellitus with other diabetic kidney complication: Secondary | ICD-10-CM

## 2022-10-10 DIAGNOSIS — I10 Essential (primary) hypertension: Secondary | ICD-10-CM

## 2022-10-29 ENCOUNTER — Other Ambulatory Visit: Payer: Self-pay | Admitting: Family Medicine

## 2022-10-29 DIAGNOSIS — E1169 Type 2 diabetes mellitus with other specified complication: Secondary | ICD-10-CM

## 2022-10-29 NOTE — Telephone Encounter (Signed)
Medication Refill - Medication: rosuvastatin (CRESTOR) 20 MG tablet   Has the patient contacted their pharmacy? Yes.   (Agent: If no, request that the patient contact the pharmacy for the refill. If patient does not wish to contact the pharmacy document the reason why and proceed with request.) (Agent: If yes, when and what did the pharmacy advise?)  Preferred Pharmacy (with phone number or street name):  [facility] [address] [place] - [address]  [address] [place] [address]  Phone: [phone] Fax: [phone]   Has the patient been seen for an appointment in the last year OR does the patient have an upcoming appointment? Yes.    Agent: Please be advised that RX refills may take up to 3 business days. We ask that you follow-up with your pharmacy.

## 2022-10-30 MED ORDER — ROSUVASTATIN CALCIUM 20 MG PO TABS
20.0000 mg | ORAL_TABLET | Freq: Every day | ORAL | 0 refills | Status: DC
Start: 2022-10-30 — End: 2022-12-19

## 2022-10-30 NOTE — Telephone Encounter (Signed)
Requested Prescriptions  Pending Prescriptions Disp Refills   rosuvastatin (CRESTOR) 20 MG tablet 90 tablet 0    Sig: Take 1 tablet (20 mg total) by mouth daily.     Cardiovascular:  Antilipid - Statins 2 Failed - 10/29/2022  1:54 PM      Failed - Cr in normal range and within 360 days    Creat  Date Value Ref Range Status  06/18/2021 1.25 (H) 0.60 - 0.95 mg/dL Final   Creatinine, Urine  Date Value Ref Range Status  06/18/2021 169 20 - 275 mg/dL Final         Failed - Lipid Panel in normal range within the last 12 months    Cholesterol, Total  Date Value Ref Range Status  08/28/2015 183 100 - 199 mg/dL Final   Cholesterol  Date Value Ref Range Status  06/18/2021 150 <200 mg/dL Final   LDL Cholesterol (Calc)  Date Value Ref Range Status  06/18/2021 83 mg/dL (calc) Final    Comment:    Reference range: <100 . Desirable range <100 mg/dL for primary prevention;   <70 mg/dL for patients with CHD or diabetic patients  with > or = 2 CHD risk factors. [name] LDL-C is now calculated using the Martin-Hopkins  calculation, which is a validated novel method providing  better accuracy than the Friedewald equation in the  estimation of LDL-C.  Horald Pollen et al. Lenox Ahr. 0454;098(11): 2061-2068  (http://education.[facility].com/faq/FAQ164)    HDL  Date Value Ref Range Status  06/18/2021 49 (L) > OR = 50 mg/dL Final  91/47/8295 50 >62 mg/dL Final   Triglycerides  Date Value Ref Range Status  06/18/2021 90 <150 mg/dL Final         Passed - Patient is not pregnant      Passed - Valid encounter within last 12 months    Recent Outpatient Visits           4 months ago Dyslipidemia associated with type 2 diabetes mellitus [facility])   [place] [facility] Alba Cory, MD   10 months ago Controlled type 2 diabetes mellitus with stage 3 chronic kidney disease, without long-term current use of insulin [facility])   [place] [facility] [place], Danna Hefty,  MD   1 year ago Controlled type 2 diabetes mellitus with stage 3 chronic kidney disease, without long-term current use of insulin [facility])   [place] [facility] [place], Danna Hefty, MD   1 year ago Controlled type 2 diabetes mellitus with stage 3 chronic kidney disease, without long-term current use of insulin [facility])   [place] [facility] Alba Cory, MD   2 years ago Controlled type 2 diabetes mellitus with stage 3 chronic kidney disease, without long-term current use of insulin [facility])   [place] [facility] Alba Cory, MD       Future Appointments             In 1 month Carlynn Purl, Danna Hefty, MD [facility]   In 1 month Alba Cory, MD [facility], [facility]

## 2022-12-02 NOTE — Progress Notes (Deleted)
Name: Tammy Lozano   MRN: [id number]    DOB: 16-Oct-1940   Date:12/02/2022       Progress Note  Subjective  Chief Complaint  CPE  HPI  Patient presents for annual CPE.  Diet: *** Exercise: ***  Last Eye Exam: *** Last Dental Exam: ***  Flowsheet Row Clinical Support from 05/30/2022 in [facility]  AUDIT-C Score 0      Depression: Phq 9 is  {Desc; negative/positive:13464}    06/17/2022   11:45 AM 05/30/2022    2:49 PM 12/17/2021   11:03 AM 06/18/2021   10:46 AM 05/23/2021   10:36 AM  Depression screen PHQ 2/9  Decreased Interest 0 0 0 0 0  Down, Depressed, Hopeless 0 0 0 0 0  PHQ - 2 Score 0 0 0 0 0  Altered sleeping 0  0 0   Tired, decreased energy 0  0 0   Change in appetite 0  0 0   Feeling bad or failure about yourself  0  0 0   Trouble concentrating 0  0 0   Moving slowly or fidgety/restless 0  0 0   Suicidal thoughts 0  0 0   PHQ-9 Score 0  0 0    Hypertension: BP Readings from Last 3 Encounters:  06/17/22 126/64  05/30/22 112/62  12/17/21 138/84   Obesity: Wt Readings from Last 3 Encounters:  06/17/22 219 lb 3.2 oz (99.4 kg)  05/30/22 216 lb 6.4 oz (98.2 kg)  12/17/21 218 lb (98.9 kg)   BMI Readings from Last 3 Encounters:  06/17/22 37.63 kg/m  05/30/22 37.14 kg/m  12/17/21 37.42 kg/m     Vaccines:   HPV: Aged Out Tdap: 01/24/2014 Shingrix: N/A Pneumonia: Completed Flu: N/A COVID-19:Completed 3 doses   Hep C Screening:  STD testing and prevention (HIV/chl/gon/syphilis):  Intimate partner violence: {Desc; negative/positive:13464} screen  Sexual History : Menstrual History/LMP/Abnormal Bleeding:  Discussed importance of follow up if any post-menopausal bleeding: {Response; yes/no/na:63}  Incontinence Symptoms: {Desc; negative/positive:13464} for symptoms   Breast cancer:  - Last Mammogram: 06/16/2022 - BRCA gene screening: N/A  Osteoporosis Prevention : Discussed high calcium and vitamin D supplementation,  weight bearing exercises Bone density :no   Cervical cancer screening:   Skin cancer: Discussed monitoring for atypical lesions  Colorectal cancer: 08/12/2017   Lung cancer:  Low Dose CT Chest recommended if Age 67-80 years, 20 pack-year currently smoking OR have quit w/in 15years. Patient {DOES NOT does:27190::"does not"} qualify for screen   ECG: 09/26/2008  Advanced Care Planning: A voluntary discussion about advance care planning including the explanation and discussion of advance directives.  Discussed health care proxy and Living will, and the patient was able to identify a health care proxy as ***.  Patient {DOES_DOES YQM:57846} have a living will and power of attorney of health care   Lipids: Lab Results  Component Value Date   CHOL 150 06/18/2021   CHOL 148 05/30/2020   CHOL 137 10/28/2018   Lab Results  Component Value Date   HDL 49 (L) 06/18/2021   HDL 43 (L) 05/30/2020   HDL 41 (L) 10/28/2018   Lab Results  Component Value Date   LDLCALC 83 06/18/2021   LDLCALC 88 05/30/2020   LDLCALC 80 10/28/2018   Lab Results  Component Value Date   TRIG 90 06/18/2021   TRIG 77 05/30/2020   TRIG 78 10/28/2018   Lab Results  Component Value Date   CHOLHDL 3.1 06/18/2021  CHOLHDL 3.4 05/30/2020   CHOLHDL 3.3 10/28/2018   No results found for: "LDLDIRECT"  Glucose: Glucose, Bld  Date Value Ref Range Status  06/18/2021 104 (H) 65 - 99 mg/dL Final    Comment:    .            Fasting reference interval . For someone without known diabetes, a glucose value between 100 and 125 mg/dL is consistent with prediabetes and should be confirmed with a follow-up test. .   05/30/2020 100 (H) 65 - 99 mg/dL Final    Comment:    .            Fasting reference interval . For someone without known diabetes, a glucose value between 100 and 125 mg/dL is consistent with prediabetes and should be confirmed with a follow-up test. .   11/28/2019 110 (H) 65 - 99 mg/dL Final     Comment:    .            Fasting reference interval . For someone without known diabetes, a glucose value between 100 and 125 mg/dL is consistent with prediabetes and should be confirmed with a follow-up test. .     Patient Active Problem List   Diagnosis Date Noted   Dyslipidemia associated with type 2 diabetes mellitus (HCC) 06/18/2021   Diabetes mellitus with microalbuminuria (HCC) 06/18/2021   Morbid obesity (HCC) 11/04/2017   Osteopenia of left hip 06/01/2017   Controlled type 2 diabetes mellitus with chronic kidney disease (HCC) 11/28/2015   CKD (chronic kidney disease) stage 3, GFR 30-59 ml/min (HCC) 11/28/2015   Hypertension 12/04/2014   Hyperlipidemia 12/04/2014    Past Surgical History:  Procedure Laterality Date   COLONOSCOPY W/ POLYPECTOMY  05/16/1994   COLONOSCOPY W/ POLYPECTOMY  12/23/2011   adenomatous polyp   COLONOSCOPY WITH PROPOFOL N/A 08/12/2017   Procedure: COLONOSCOPY WITH PROPOFOL;  Surgeon: Scot Jun, MD;  [facility] END[facility]COPY;  Service: Endoscopy;  Laterality: N/A;   KNEE SURGERY Left 2010   arthroscopic    Family History  Problem Relation Age of Onset   Cancer Mother        lung   Cancer Father        brain   Aneurysm Sister    Cancer Brother        pancreatic   Cancer Brother        esophagus   Colon cancer Paternal Grandfather    Multiple sclerosis Daughter    Diabetes Daughter     Social History   Socioeconomic History   Marital status: Divorced    Spouse name: Not on file   Number of children: 3   Years of education: 12   Highest education level: 12th grade  Occupational History    Employer: RETIRED  Tobacco Use   Smoking status: Former    Current packs/day: 0.00    Average packs/day: 2.0 packs/day for 40.0 years (80.0 ttl pk-yrs)    Types: Cigarettes    Start date: 26    Quit date: 2002    Years since quitting: 22.6   Smokeless tobacco: Never   Tobacco comments:    quit in 2002. Has not resumed smoking  since 2002. Pt does not require smoking cessation materials.  Vaping Use   Vaping status: Never Used  Substance and Sexual Activity   Alcohol use: No    Alcohol/week: 0.0 standard drinks of alcohol   Drug use: No   Sexual activity: Not Currently  Other Topics  Concern   Not on file  Social History Narrative   Her 58 great-grandson lives with her, her grandson and his wife live in the same apartment complex    Social Determinants of Health   Financial Resource Strain: Low Risk  (05/30/2022)   Overall Financial Resource Strain (CARDIA)    Difficulty of Paying Living Expenses: Not hard at all  Food Insecurity: No Food Insecurity (05/30/2022)   Hunger Vital Sign    Worried About Running Out of Food in the Last Year: Never true    Ran Out of Food in the Last Year: Never true  Transportation Needs: No Transportation Needs (05/30/2022)   PRAPARE - Administrator, Civil Service (Medical): No    Lack of Transportation (Non-Medical): No  Physical Activity: Inactive (05/30/2022)   Exercise Vital Sign    Days of Exercise per Week: 0 days    Minutes of Exercise per Session: 0 min  Stress: No Stress Concern Present (05/30/2022)   [employer] of Occupational Health - Occupational Stress Questionnaire    Feeling of Stress : Not at all  Social Connections: Socially Isolated (05/30/2022)   Social Connection and Isolation Panel [NHANES]    Frequency of Communication with Friends and Family: More than three times a week    Frequency of Social Gatherings with Friends and Family: More than three times a week    Attends Religious Services: Never    Database administrator or Organizations: No    Attends Banker Meetings: Never    Marital Status: Divorced  Catering manager Violence: Not At Risk (05/30/2022)   Humiliation, Afraid, Rape, and Kick questionnaire    Fear of Current or Ex-Partner: No    Emotionally Abused: No    Physically Abused: No    Sexually Abused: No      Current Outpatient Medications:    Cholecalciferol (VITAMIN D-3) 1000 units CAPS, Take 1 capsule (1,000 Units total) by mouth daily., Disp: 30 capsule, Rfl: 0   losartan (COZAAR) 100 MG tablet, Take 1 tablet by mouth once daily, Disp: 90 tablet, Rfl: 0   Omega-3 Fatty Acids (FISH OIL) 1000 MG CAPS, Take 1,000 mg by mouth daily. , Disp: , Rfl:    rosuvastatin (CRESTOR) 20 MG tablet, Take 1 tablet (20 mg total) by mouth daily., Disp: 90 tablet, Rfl: 0  Allergies  Allergen Reactions   Ace Inhibitors     cough     ROS  ***  Objective  There were no vitals filed for this visit.  There is no height or weight on file to calculate BMI.  Physical Exam ***  No results found for this or any previous visit (from the past 2160 hour(s)).   Fall Risk:    06/17/2022   11:45 AM 05/30/2022    2:46 PM 12/17/2021   11:03 AM 06/18/2021   10:46 AM 05/23/2021   10:37 AM  Fall Risk   Falls in the past year? 0 0 0 0 0  Number falls in past yr:  0 0 0 0  Injury with Fall?  0 0 0 0  Risk for fall due to : Impaired balance/gait No Fall Risks Impaired balance/gait;Impaired mobility Impaired balance/gait;Impaired mobility Impaired balance/gait  Follow up Falls prevention discussed;Education provided;Falls evaluation completed Education provided;Falls prevention discussed Falls prevention discussed Falls prevention discussed Falls prevention discussed   ***  Functional Status Survey:   ***  Assessment & Plan  There are no diagnoses linked to this encounter.  -  USPSTF grade A and B recommendations reviewed with patient; age-appropriate recommendations, preventive care, screening tests, etc discussed and encouraged; healthy living encouraged; see AVS for patient education given to patient -Discussed importance of 150 minutes of physical activity weekly, eat two servings of fish weekly, eat one serving of tree nuts ( cashews, pistachios, pecans, almonds.[name]) every other day, eat 6 servings of  fruit/vegetables daily and drink plenty of water and avoid sweet beverages.   -Reviewed Health Maintenance: {yes WU:[id number]}

## 2022-12-03 ENCOUNTER — Encounter: Payer: Medicare Other | Admitting: Family Medicine

## 2022-12-03 DIAGNOSIS — E1169 Type 2 diabetes mellitus with other specified complication: Secondary | ICD-10-CM

## 2022-12-18 NOTE — Progress Notes (Signed)
Name: Tammy Lozano   MRN: [id number]    DOB: Sep 28, 1940   Date:12/19/2022       Progress Note  Subjective  Chief Complaint  Follow Up  HPI  Diabetes type II: diagnosed October 2018 with hgbA1C of 6.6%, A1C has been at goal since, today it is 6.1%  She denies polyphagia, polydipsia or polyuria. She is obese, her weight is a few pounds since last visit .  She has obesity and also CKI and is on ARB, urine micro used to be elevated and we will recheck it next visit, , she has CKI stage III . She also has dyslipidemia and is on statin therapy, last LDL was slightly above goal at 83  . Eye exam is up to date, she had it done 05/2022    Dyslipidemia Last LDL was 83, explained goal is below 70 and we changed from Atorvastatin to Rosuvastatin and she will have repeat labs today since she did not have it done in March    HTN: she takes losartan 100 mg daily , states ACE caused a cough, no chest pain or palpitation, BP is at goal.   OA: left hip and both  knee. She is no longer taking NSAID's , only on Tylenol prn, she has  CKI stage III, she uses a walker all the time, inside and outside the home, she has difficulty getting from chairs and also toilet, she has tub bench - she never takes a shower without a relative at home . Her great grandson who is 45 yo lives with her    Morbid obesity: BMI above 35 with co-morbidities. she states her weight as an adult was 158 lbs, after third child weight was 132 lbs and remained that way until 1982 after she lost her mother, and worse since 1991 when her sister diet, she went above 200 lbs and has been stable since, she drinks sodas and sweet tea intermittently , she does not follow a diet and is okay with her weight    CKI: she used to see Dr. Thedore Mins,  she has a history of  microalbuminuria at 49 , now on ARB, GFR was in the 40's and stable, last urine micro back to normal. She denies pruritis and has normal urine output. Reminded her to not take NSAID's , she takes  Tylenol for pain prn   Insomnia: she spends a lot of time watching TV and staying in her bed during the day due to OA and pain, therefore her sleep is very interrupted at night. She also uses her phone in the middle of the night. Discussed sleep hygiene   Patient Active Problem List   Diagnosis Date Noted   Dyslipidemia associated with type 2 diabetes mellitus (HCC) 06/18/2021   Diabetes mellitus with microalbuminuria (HCC) 06/18/2021   Morbid obesity (HCC) 11/04/2017   Osteopenia of left hip 06/01/2017   Controlled type 2 diabetes mellitus with chronic kidney disease (HCC) 11/28/2015   CKD (chronic kidney disease) stage 3, GFR 30-59 ml/min (HCC) 11/28/2015   Hypertension 12/04/2014   Hyperlipidemia 12/04/2014    Past Surgical History:  Procedure Laterality Date   COLONOSCOPY W/ POLYPECTOMY  05/16/1994   COLONOSCOPY W/ POLYPECTOMY  12/23/2011   adenomatous polyp   COLONOSCOPY WITH PROPOFOL N/A 08/12/2017   Procedure: COLONOSCOPY WITH PROPOFOL;  Surgeon: Scot Jun, MD;  Location: [facility] ENDOSCOPY;  Service: Endoscopy;  Laterality: N/A;   KNEE SURGERY Left 2010   arthroscopic    Family History  Problem Relation Age of Onset   Cancer Mother        lung   Cancer Father        brain   Aneurysm Sister    Cancer Brother        pancreatic   Cancer Brother        esophagus   Colon cancer Paternal Grandfather    Multiple sclerosis Daughter    Diabetes Daughter     Social History   Tobacco Use   Smoking status: Former    Current packs/day: 0.00    Average packs/day: 2.0 packs/day for 40.0 years (80.0 ttl pk-yrs)    Types: Cigarettes    Start date: 60    Quit date: 2002    Years since quitting: 22.7   Smokeless tobacco: Never   Tobacco comments:    quit in 2002. Has not resumed smoking since 2002. Pt does not require smoking cessation materials.  Substance Use Topics   Alcohol use: No    Alcohol/week: 0.0 standard drinks of alcohol     Current Outpatient  Medications:    Cholecalciferol (VITAMIN D-3) 1000 units CAPS, Take 1 capsule (1,000 Units total) by mouth daily., Disp: 30 capsule, Rfl: 0   Omega-3 Fatty Acids (FISH OIL) 1000 MG CAPS, Take 1,000 mg by mouth daily. , Disp: , Rfl:    losartan (COZAAR) 100 MG tablet, Take 1 tablet (100 mg total) by mouth daily., Disp: 90 tablet, Rfl: 1   rosuvastatin (CRESTOR) 20 MG tablet, Take 1 tablet (20 mg total) by mouth daily., Disp: 90 tablet, Rfl: 1  Allergies  Allergen Reactions   Ace Inhibitors     cough    I personally reviewed active problem list, medication list, allergies, family history, social history, health maintenance with the patient/caregiver today.   ROS  Ten systems reviewed and is negative except as mentioned in HPI    Objective  Vitals:   12/19/22 1116  BP: 130/70  Pulse: 91  Resp: 16  SpO2: 96%  Weight: 216 lb (98 kg)  Height: 5\' 5"  (1.651 m)    Body mass index is 35.94 kg/m.  Physical Exam  Constitutional: Patient appears well-developed and well-nourished. Obese  No distress.  HEENT: head atraumatic, normocephalic, pupils equal and reactive to light, neck supple Cardiovascular: Normal rate, regular rhythm and normal heart sounds.  No murmur heard. No BLE edema. Pulmonary/Chest: Effort normal and breath sounds normal. No respiratory distress. Abdominal: Soft.  There is no tenderness. Muscular skeletal: using a walker, crepitus with extension of both knees  Psychiatric: Patient has a normal mood and affect. behavior is normal. Judgment and thought content normal.   Recent Results (from the past 2160 hour(s))  POCT HgB A1C     Status: Abnormal   Collection Time: 12/19/22 11:19 AM  Result Value Ref Range   Hemoglobin A1C 6.1 (A) 4.0 - 5.6 %   HbA1c POC (<> result, manual entry)     HbA1c, POC (prediabetic range)     HbA1c, POC (controlled diabetic range)      Diabetic Foot Exam: Diabetic Foot Exam - Simple   Simple Foot Form Visual Inspection See  comments: Yes Sensation Testing Intact to touch and monofilament testing bilaterally: Yes Pulse Check Posterior Tibialis and Dorsalis pulse intact bilaterally: Yes Comments Onychomycosis       PHQ2/9:    12/19/2022   11:16 AM 06/17/2022   11:45 AM 05/30/2022    2:49 PM 12/17/2021   11:03 AM 06/18/2021  10:46 AM  Depression screen PHQ 2/9  Decreased Interest 0 0 0 0 0  Down, Depressed, Hopeless 0 0 0 0 0  PHQ - 2 Score 0 0 0 0 0  Altered sleeping 0 0  0 0  Tired, decreased energy 0 0  0 0  Change in appetite 0 0  0 0  Feeling bad or failure about yourself  0 0  0 0  Trouble concentrating 0 0  0 0  Moving slowly or fidgety/restless 0 0  0 0  Suicidal thoughts 0 0  0 0  PHQ-9 Score 0 0  0 0    phq 9 is negative   Fall Risk:    12/19/2022   11:16 AM 06/17/2022   11:45 AM 05/30/2022    2:46 PM 12/17/2021   11:03 AM 06/18/2021   10:46 AM  Fall Risk   Falls in the past year? 0 0 0 0 0  Number falls in past yr: 0  0 0 0  Injury with Fall? 0  0 0 0  Risk for fall due to : Impaired mobility;Impaired balance/gait Impaired balance/gait No Fall Risks Impaired balance/gait;Impaired mobility Impaired balance/gait;Impaired mobility  Follow up Falls prevention discussed Falls prevention discussed;Education provided;Falls evaluation completed Education provided;Falls prevention discussed Falls prevention discussed Falls prevention discussed      Functional Status Survey: Is the patient deaf or have difficulty hearing?: No Does the patient have difficulty seeing, even when wearing glasses/contacts?: No Does the patient have difficulty concentrating, remembering, or making decisions?: No Does the patient have difficulty walking or climbing stairs?: Yes Does the patient have difficulty dressing or bathing?: No Does the patient have difficulty doing errands alone such as visiting a doctor's office or shopping?: No    Assessment & Plan  1. Dyslipidemia associated with type 2 diabetes  mellitus (HCC)  - POCT HgB A1C - HM Diabetes Foot Exam - rosuvastatin (CRESTOR) 20 MG tablet; Take 1 tablet (20 mg total) by mouth daily.  Dispense: 90 tablet; Refill: 1 - COMPLETE METABOLIC PANEL WITH GFR - Urine Microalbumin w/creat. ratio  2. Controlled type 2 diabetes mellitus with stage 3 chronic kidney disease, without long-term current use of insulin (HCC)  - losartan (COZAAR) 100 MG tablet; Take 1 tablet (100 mg total) by mouth daily.  Dispense: 90 tablet; Refill: 1  3. Diabetes mellitus with microalbuminuria (HCC)  - losartan (COZAAR) 100 MG tablet; Take 1 tablet (100 mg total) by mouth daily.  Dispense: 90 tablet; Refill: 1  4. Stage 3a chronic kidney disease (HCC)  - CBC with Differential/Platelet  5. Morbid obesity (HCC)  Weight is stable, does not want to follow a diet  6. Primary osteoarthritis of both knees  Stable   7. Essential hypertension  - losartan (COZAAR) 100 MG tablet; Take 1 tablet (100 mg total) by mouth daily.  Dispense: 90 tablet; Refill: 1 - EKG 12-Lead -  - Lipid Profile  8. Gait difficulty   9. Vitamin D deficiency  - Vitamin D (25 hydroxy)  10. Needs flu shot  - Flu Vaccine QUAD High Dose(Fluad)

## 2022-12-19 ENCOUNTER — Encounter: Payer: Self-pay | Admitting: Family Medicine

## 2022-12-19 ENCOUNTER — Ambulatory Visit (INDEPENDENT_AMBULATORY_CARE_PROVIDER_SITE_OTHER): Payer: Medicare Other | Admitting: Family Medicine

## 2022-12-19 VITALS — BP 130/70 | HR 91 | Resp 16 | Ht 65.0 in | Wt 216.0 lb

## 2022-12-19 DIAGNOSIS — M170 Bilateral primary osteoarthritis of knee: Secondary | ICD-10-CM

## 2022-12-19 DIAGNOSIS — E1169 Type 2 diabetes mellitus with other specified complication: Secondary | ICD-10-CM | POA: Diagnosis not present

## 2022-12-19 DIAGNOSIS — E559 Vitamin D deficiency, unspecified: Secondary | ICD-10-CM | POA: Diagnosis not present

## 2022-12-19 DIAGNOSIS — I10 Essential (primary) hypertension: Secondary | ICD-10-CM

## 2022-12-19 DIAGNOSIS — R269 Unspecified abnormalities of gait and mobility: Secondary | ICD-10-CM | POA: Diagnosis not present

## 2022-12-19 DIAGNOSIS — E1129 Type 2 diabetes mellitus with other diabetic kidney complication: Secondary | ICD-10-CM | POA: Diagnosis not present

## 2022-12-19 DIAGNOSIS — N1831 Chronic kidney disease, stage 3a: Secondary | ICD-10-CM | POA: Diagnosis not present

## 2022-12-19 DIAGNOSIS — E785 Hyperlipidemia, unspecified: Secondary | ICD-10-CM

## 2022-12-19 DIAGNOSIS — Z23 Encounter for immunization: Secondary | ICD-10-CM | POA: Diagnosis not present

## 2022-12-19 DIAGNOSIS — E1122 Type 2 diabetes mellitus with diabetic chronic kidney disease: Secondary | ICD-10-CM

## 2022-12-19 DIAGNOSIS — N183 Chronic kidney disease, stage 3 unspecified: Secondary | ICD-10-CM | POA: Diagnosis not present

## 2022-12-19 LAB — POCT GLYCOSYLATED HEMOGLOBIN (HGB A1C): Hemoglobin A1C: 6.1 % — AB (ref 4.0–5.6)

## 2022-12-19 MED ORDER — LOSARTAN POTASSIUM 100 MG PO TABS
100.0000 mg | ORAL_TABLET | Freq: Every day | ORAL | 1 refills | Status: DC
Start: 2022-12-19 — End: 2023-04-02

## 2022-12-19 MED ORDER — ROSUVASTATIN CALCIUM 20 MG PO TABS
20.0000 mg | ORAL_TABLET | Freq: Every day | ORAL | 1 refills | Status: DC
Start: 2022-12-19 — End: 2023-06-24

## 2022-12-20 LAB — CBC WITH DIFFERENTIAL/PLATELET
Absolute Monocytes: 329 {cells}/uL (ref 200–950)
Basophils Absolute: 52 {cells}/uL (ref 0–200)
Basophils Relative: 1.1 %
Eosinophils Absolute: 230 {cells}/uL (ref 15–500)
Eosinophils Relative: 4.9 %
HCT: 38.3 % (ref 35.0–45.0)
Hemoglobin: 12.3 g/dL (ref 11.7–15.5)
Lymphs Abs: 1546 {cells}/uL (ref 850–3900)
MCH: 30 pg (ref 27.0–33.0)
MCHC: 32.1 g/dL (ref 32.0–36.0)
MCV: 93.4 fL (ref 80.0–100.0)
MPV: 9.8 fL (ref 7.5–12.5)
Monocytes Relative: 7 %
Neutro Abs: 2543 {cells}/uL (ref 1500–7800)
Neutrophils Relative %: 54.1 %
Platelets: 261 10*3/uL (ref 140–400)
RBC: 4.1 10*6/uL (ref 3.80–5.10)
RDW: 14.1 % (ref 11.0–15.0)
Total Lymphocyte: 32.9 %
WBC: 4.7 10*3/uL (ref 3.8–10.8)

## 2022-12-20 LAB — COMPLETE METABOLIC PANEL WITH GFR
AG Ratio: 1.3 (calc) (ref 1.0–2.5)
ALT: 9 U/L (ref 6–29)
AST: 11 U/L (ref 10–35)
Albumin: 3.6 g/dL (ref 3.6–5.1)
Alkaline phosphatase (APISO): 68 U/L (ref 37–153)
BUN/Creatinine Ratio: 16 (calc) (ref 6–22)
BUN: 16 mg/dL (ref 7–25)
CO2: 29 mmol/L (ref 20–32)
Calcium: 9.3 mg/dL (ref 8.6–10.4)
Chloride: 104 mmol/L (ref 98–110)
Creat: 1.01 mg/dL — ABNORMAL HIGH (ref 0.60–0.95)
Globulin: 2.7 g/dL (ref 1.9–3.7)
Glucose, Bld: 93 mg/dL (ref 65–99)
Potassium: 4.6 mmol/L (ref 3.5–5.3)
Sodium: 142 mmol/L (ref 135–146)
Total Bilirubin: 1.1 mg/dL (ref 0.2–1.2)
Total Protein: 6.3 g/dL (ref 6.1–8.1)
eGFR: 55 mL/min/{1.73_m2} — ABNORMAL LOW (ref >=60)

## 2022-12-20 LAB — LIPID PANEL
Cholesterol: 134 mg/dL (ref <200)
HDL: 51 mg/dL (ref >=50)
LDL Cholesterol (Calc): 65 mg/dL
Non-HDL Cholesterol (Calc): 83 mg/dL (ref <130)
Total CHOL/HDL Ratio: 2.6 (calc) (ref <5.0)
Triglycerides: 94 mg/dL (ref <150)

## 2022-12-20 LAB — VITAMIN D 25 HYDROXY (VIT D DEFICIENCY, FRACTURES): Vit D, 25-Hydroxy: 55 ng/mL (ref 30–100)

## 2023-03-03 ENCOUNTER — Encounter: Payer: Medicare Other | Admitting: Family Medicine

## 2023-03-16 NOTE — Progress Notes (Signed)
Name: Tammy Lozano   MRN: [id number]    DOB: September 26, 1940   Date:04/02/2023       Progress Note  Subjective  Chief Complaint  Chief Complaint  Patient presents with   Annual Exam    HPI  Patient presents for annual CPE.  Diet: not following a diabetic diet  Exercise: discussed importance of regular physical activity   Last Eye Exam: we will obtain records  Last Dental Exam: she has dentures, no problems with gum line  Flowsheet Row Office Visit from 04/02/2023 in [facility]  AUDIT-C Score 0      Depression: Phq 9 is  negative    12/19/2022   11:16 AM 06/17/2022   11:45 AM 05/30/2022    2:49 PM 12/17/2021   11:03 AM 06/18/2021   10:46 AM  Depression screen PHQ 2/9  Decreased Interest 0 0 0 0 0  Down, Depressed, Hopeless 0 0 0 0 0  PHQ - 2 Score 0 0 0 0 0  Altered sleeping 0 0  0 0  Tired, decreased energy 0 0  0 0  Change in appetite 0 0  0 0  Feeling bad or failure about yourself  0 0  0 0  Trouble concentrating 0 0  0 0  Moving slowly or fidgety/restless 0 0  0 0  Suicidal thoughts 0 0  0 0  PHQ-9 Score 0 0  0 0   Hypertension: BP Readings from Last 3 Encounters:  04/02/23 128/74  12/19/22 130/70  06/17/22 126/64   Obesity: Wt Readings from Last 3 Encounters:  04/02/23 211 lb 3.2 oz (95.8 kg)  12/19/22 216 lb (98 kg)  06/17/22 219 lb 3.2 oz (99.4 kg)   BMI Readings from Last 3 Encounters:  04/02/23 35.15 kg/m  12/19/22 35.94 kg/m  06/17/22 37.63 kg/m     Vaccines:   RSV: discussed with patient Tdap: 01/24/2014 - advised to go to regular pharmacy  Shingrix: discussed with patient  Pneumonia: Complete Flu: comoplete COVID-19:3 doses   Hep C Screening:  STD testing and prevention (HIV/chl/gon/syphilis): N/A Intimate partner violence: negative screen  Sexual History : not currently  Menstrual History/LMP/Abnormal Bleeding: post-menopausal  Discussed importance of follow up if any post-menopausal bleeding: yes   Incontinence Symptoms: positive for symptoms when bladder is full and has forceful cough   Breast cancer:  - Last Mammogram: aged out - BRCA gene screening: N/A  Osteoporosis Prevention : Discussed high calcium and vitamin D supplementation, weight bearing exercises Bone density : she refuses   Cervical cancer screening: Aged out  Skin cancer: Discussed monitoring for atypical lesions  Colorectal cancer: 08/12/16 Lung cancer:  Low Dose CT Chest recommended if Age 41-80 years, 20 pack-year currently smoking OR have quit w/in 15years. Patient does not qualify for screen   ECG: 12/19/22  Advanced Care Planning: A voluntary discussion about advance care planning including the explanation and discussion of advance directives.  Discussed health care proxy and Living will, and the patient was able to identify a health care proxy as Thayer Ohm her grandson - she lives with him.  Patient does not have a living will and power of attorney of health care   Lipids: Lab Results  Component Value Date   CHOL 134 12/19/2022   CHOL 150 06/18/2021   CHOL 148 05/30/2020   Lab Results  Component Value Date   HDL 51 12/19/2022   HDL 49 (L) 06/18/2021   HDL 43 (L) 05/30/2020  Lab Results  Component Value Date   LDLCALC 65 12/19/2022   LDLCALC 83 06/18/2021   LDLCALC 88 05/30/2020   Lab Results  Component Value Date   TRIG 94 12/19/2022   TRIG 90 06/18/2021   TRIG 77 05/30/2020   Lab Results  Component Value Date   CHOLHDL 2.6 12/19/2022   CHOLHDL 3.1 06/18/2021   CHOLHDL 3.4 05/30/2020   No results found for: "LDLDIRECT"  Glucose: Glucose, Bld  Date Value Ref Range Status  12/19/2022 93 65 - 99 mg/dL Final    Comment:    .            Fasting reference interval .   06/18/2021 104 (H) 65 - 99 mg/dL Final    Comment:    .            Fasting reference interval . For someone without known diabetes, a glucose value between 100 and 125 mg/dL is consistent with prediabetes and should  be confirmed with a follow-up test. .   05/30/2020 100 (H) 65 - 99 mg/dL Final    Comment:    .            Fasting reference interval . For someone without known diabetes, a glucose value between 100 and 125 mg/dL is consistent with prediabetes and should be confirmed with a follow-up test. .     Patient Active Problem List   Diagnosis Date Noted   Dyslipidemia associated with type 2 diabetes mellitus (HCC) 06/18/2021   Diabetes mellitus with microalbuminuria (HCC) 06/18/2021   Morbid obesity (HCC) 11/04/2017   Osteopenia of left hip 06/01/2017   Controlled type 2 diabetes mellitus with chronic kidney disease (HCC) 11/28/2015   CKD (chronic kidney disease) stage 3, GFR 30-59 ml/min (HCC) 11/28/2015   Hypertension 12/04/2014   Hyperlipidemia 12/04/2014    Past Surgical History:  Procedure Laterality Date   COLONOSCOPY W/ POLYPECTOMY  05/16/1994   COLONOSCOPY W/ POLYPECTOMY  12/23/2011   adenomatous polyp   COLONOSCOPY WITH PROPOFOL N/A 08/12/2017   Procedure: COLONOSCOPY WITH PROPOFOL;  Surgeon: Scot Jun, MD;  Location: [facility] ENDOSCOPY;  Service: Endoscopy;  Laterality: N/A;   KNEE SURGERY Left 2010   arthroscopic    Family History  Problem Relation Age of Onset   Cancer Mother        lung   Cancer Father        brain   Aneurysm Sister    Cancer Brother        pancreatic   Cancer Brother        esophagus   Colon cancer Paternal Grandfather    Multiple sclerosis Daughter    Diabetes Daughter     Social History   Socioeconomic History   Marital status: Divorced    Spouse name: Not on file   Number of children: 3   Years of education: 12   Highest education level: 12th grade  Occupational History    Employer: RETIRED  Tobacco Use   Smoking status: Former    Current packs/day: 0.00    Average packs/day: 2.0 packs/day for 40.0 years (80.0 ttl pk-yrs)    Types: Cigarettes    Start date: 1    Quit date: 2002    Years since quitting: 23.0    Smokeless tobacco: Never   Tobacco comments:    quit in 2002. Has not resumed smoking since 2002. Pt does not require smoking cessation materials.  Vaping Use   Vaping status: Never Used  Substance and Sexual Activity   Alcohol use: No   Drug use: Never   Sexual activity: Not Currently    Birth control/protection: None  Other Topics Concern   Not on file  Social History Narrative   Her 66 great-grandson lives with her, her grandson and his wife live in the same apartment complex    Social Drivers of Health   Financial Resource Strain: Low Risk  (03/29/2023)   Overall Financial Resource Strain (CARDIA)    Difficulty of Paying Living Expenses: Not hard at all  Food Insecurity: Food Insecurity Present (03/29/2023)   Hunger Vital Sign    Worried About Running Out of Food in the Last Year: Never true    Ran Out of Food in the Last Year: Sometimes true  Transportation Needs: No Transportation Needs (03/29/2023)   PRAPARE - Administrator, Civil Service (Medical): No    Lack of Transportation (Non-Medical): No  Physical Activity: Inactive (03/29/2023)   Exercise Vital Sign    Days of Exercise per Week: 0 days    Minutes of Exercise per Session: 0 min  Stress: No Stress Concern Present (03/29/2023)   [employer] of Occupational Health - Occupational Stress Questionnaire    Feeling of Stress : Only a little  Social Connections: Socially Isolated (03/29/2023)   Social Connection and Isolation Panel [NHANES]    Frequency of Communication with Friends and Family: More than three times a week    Frequency of Social Gatherings with Friends and Family: More than three times a week    Attends Religious Services: Never    Database administrator or Organizations: No    Attends Banker Meetings: Never    Marital Status: Divorced  Catering manager Violence: Not At Risk (05/30/2022)   Humiliation, Afraid, Rape, and Kick questionnaire    Fear of Current or  Ex-Partner: No    Emotionally Abused: No    Physically Abused: No    Sexually Abused: No     Current Outpatient Medications:    Cholecalciferol (VITAMIN D-3) 1000 units CAPS, Take 1 capsule (1,000 Units total) by mouth daily., Disp: 30 capsule, Rfl: 0   losartan (COZAAR) 100 MG tablet, Take 1 tablet (100 mg total) by mouth daily., Disp: 90 tablet, Rfl: 1   Omega-3 Fatty Acids (FISH OIL) 1000 MG CAPS, Take 1,000 mg by mouth daily. , Disp: , Rfl:    rosuvastatin (CRESTOR) 20 MG tablet, Take 1 tablet (20 mg total) by mouth daily., Disp: 90 tablet, Rfl: 1  Allergies  Allergen Reactions   Ace Inhibitors     cough     ROS  Ten systems reviewed and is negative except as mentioned in HPI    Objective  Vitals:   04/02/23 1057  BP: 128/74  Pulse: 98  Resp: 18  Temp: 97.6 F (36.4 C)  TempSrc: Oral  SpO2: 94%  Weight: 211 lb 3.2 oz (95.8 kg)  Height: 5\' 5"  (1.651 m)    Body mass index is 35.15 kg/m.  Physical Exam  Constitutional: Patient appears well-developed and well-nourished. No distress.  HENT: Head: Normocephalic and atraumatic. Ears: B TMs ok, no erythema or effusion; Nose: Nose normal. Mouth/Throat: Oropharynx is clear and moist. No oropharyngeal exudate.  Eyes: Conjunctivae and EOM are normal. Pupils are equal, round, and reactive to light. No scleral icterus.  Neck: Normal range of motion. Neck supple. No JVD present. No thyromegaly present.  Cardiovascular: Normal rate, regular rhythm and normal heart sounds.  No murmur heard. No BLE edema. Pulmonary/Chest: Effort normal and breath sounds normal. No respiratory distress. Abdominal: Soft. Bowel sounds are normal, no distension. There is no tenderness. no masses Breast: refused  FEMALE GENITALIA: refused Not done  RECTAL: not done Musculoskeletal: Normal range of motion, no joint effusions. No gross deformities Neurological: he is alert and oriented to person, place, and time. No cranial nerve deficit.  Coordination, balance, strength, speech and gait are normal.  Skin: Skin is warm and dry. No rash noted. No erythema.  Psychiatric: Patient has a normal mood and affect. behavior is normal. Judgment and thought content normal.    Fall Risk:    04/02/2023   10:48 AM 12/19/2022   11:16 AM 06/17/2022   11:45 AM 05/30/2022    2:46 PM 12/17/2021   11:03 AM  Fall Risk   Falls in the past year? 0 0 0 0 0  Number falls in past yr: 0 0  0 0  Injury with Fall? 0 0  0 0  Risk for fall due to : No Fall Risks Impaired mobility;Impaired balance/gait Impaired balance/gait No Fall Risks Impaired balance/gait;Impaired mobility  Follow up Falls prevention discussed;Education provided;Falls evaluation completed Falls prevention discussed Falls prevention discussed;Education provided;Falls evaluation completed Education provided;Falls prevention discussed Falls prevention discussed     Assessment & Plan  1. Well adult exam (Primary)    -USPSTF grade A and B recommendations reviewed with patient; age-appropriate recommendations, preventive care, screening tests, etc discussed and encouraged; healthy living encouraged; see AVS for patient education given to patient -Discussed importance of 150 minutes of physical activity weekly, eat two servings of fish weekly, eat one serving of tree nuts ( cashews, pistachios, pecans, almonds.[name]) every other day, eat 6 servings of fruit/vegetables daily and drink plenty of water and avoid sweet beverages.   -Reviewed Health Maintenance: Yes.

## 2023-04-02 ENCOUNTER — Other Ambulatory Visit: Payer: Self-pay

## 2023-04-02 ENCOUNTER — Ambulatory Visit (INDEPENDENT_AMBULATORY_CARE_PROVIDER_SITE_OTHER): Payer: Medicare Other | Admitting: Family Medicine

## 2023-04-02 ENCOUNTER — Encounter: Payer: Self-pay | Admitting: Family Medicine

## 2023-04-02 VITALS — BP 128/74 | HR 98 | Temp 97.6°F | Resp 18 | Ht 65.0 in | Wt 211.2 lb

## 2023-04-02 DIAGNOSIS — E559 Vitamin D deficiency, unspecified: Secondary | ICD-10-CM | POA: Diagnosis not present

## 2023-04-02 DIAGNOSIS — I10 Essential (primary) hypertension: Secondary | ICD-10-CM

## 2023-04-02 DIAGNOSIS — R809 Proteinuria, unspecified: Secondary | ICD-10-CM

## 2023-04-02 DIAGNOSIS — E1122 Type 2 diabetes mellitus with diabetic chronic kidney disease: Secondary | ICD-10-CM

## 2023-04-02 DIAGNOSIS — E1169 Type 2 diabetes mellitus with other specified complication: Secondary | ICD-10-CM | POA: Diagnosis not present

## 2023-04-02 DIAGNOSIS — N1831 Chronic kidney disease, stage 3a: Secondary | ICD-10-CM | POA: Diagnosis not present

## 2023-04-02 DIAGNOSIS — E785 Hyperlipidemia, unspecified: Secondary | ICD-10-CM | POA: Diagnosis not present

## 2023-04-02 DIAGNOSIS — Z0000 Encounter for general adult medical examination without abnormal findings: Secondary | ICD-10-CM | POA: Diagnosis not present

## 2023-04-02 MED ORDER — LOSARTAN POTASSIUM 100 MG PO TABS
100.0000 mg | ORAL_TABLET | Freq: Every day | ORAL | 1 refills | Status: DC
Start: 2023-04-02 — End: 2023-12-25

## 2023-04-02 NOTE — Patient Instructions (Signed)
RSV Tdap is due October 2025 Shingrix

## 2023-04-03 LAB — MICROALBUMIN / CREATININE URINE RATIO
Creatinine, Urine: 337 mg/dL — ABNORMAL HIGH (ref 20–275)
Microalb Creat Ratio: 18 mg/g{creat} (ref <30)
Microalb, Ur: 5.9 mg/dL

## 2023-06-18 ENCOUNTER — Ambulatory Visit: Payer: Medicare Other | Admitting: Family Medicine

## 2023-06-24 ENCOUNTER — Encounter: Payer: Self-pay | Admitting: Family Medicine

## 2023-06-24 ENCOUNTER — Ambulatory Visit: Payer: Medicare Other | Admitting: Family Medicine

## 2023-06-24 VITALS — BP 124/78 | HR 95 | Resp 16 | Ht 65.0 in | Wt 210.7 lb

## 2023-06-24 DIAGNOSIS — E1169 Type 2 diabetes mellitus with other specified complication: Secondary | ICD-10-CM

## 2023-06-24 DIAGNOSIS — E559 Vitamin D deficiency, unspecified: Secondary | ICD-10-CM

## 2023-06-24 DIAGNOSIS — N183 Chronic kidney disease, stage 3 unspecified: Secondary | ICD-10-CM

## 2023-06-24 DIAGNOSIS — N1831 Chronic kidney disease, stage 3a: Secondary | ICD-10-CM

## 2023-06-24 DIAGNOSIS — M170 Bilateral primary osteoarthritis of knee: Secondary | ICD-10-CM

## 2023-06-24 DIAGNOSIS — R269 Unspecified abnormalities of gait and mobility: Secondary | ICD-10-CM | POA: Diagnosis not present

## 2023-06-24 DIAGNOSIS — E785 Hyperlipidemia, unspecified: Secondary | ICD-10-CM | POA: Diagnosis not present

## 2023-06-24 DIAGNOSIS — E669 Obesity, unspecified: Secondary | ICD-10-CM

## 2023-06-24 DIAGNOSIS — I10 Essential (primary) hypertension: Secondary | ICD-10-CM

## 2023-06-24 DIAGNOSIS — I152 Hypertension secondary to endocrine disorders: Secondary | ICD-10-CM

## 2023-06-24 DIAGNOSIS — E1159 Type 2 diabetes mellitus with other circulatory complications: Secondary | ICD-10-CM

## 2023-06-24 DIAGNOSIS — E1122 Type 2 diabetes mellitus with diabetic chronic kidney disease: Secondary | ICD-10-CM

## 2023-06-24 LAB — HEMOGLOBIN A1C
Hgb A1c MFr Bld: 6 %{Hb} — ABNORMAL HIGH (ref <5.7)
Mean Plasma Glucose: 126 mg/dL
eAG (mmol/L): 7 mmol/L

## 2023-06-24 MED ORDER — ROSUVASTATIN CALCIUM 20 MG PO TABS
20.0000 mg | ORAL_TABLET | Freq: Every day | ORAL | 1 refills | Status: DC
Start: 2023-06-24 — End: 2023-12-25

## 2023-06-24 NOTE — Progress Notes (Signed)
 Name: Tammy Lozano   MRN: [id number]    DOB: Apr 03, 1941   Date:06/24/2023       Progress Note  Subjective  Chief Complaint  Chief Complaint  Patient presents with   Medical Management of Chronic Issues   HPI   Diabetes type II: diagnosed October 2018 with hgbA1C of 6.6%, A1C has been at goal since with life style modification.  She denies polyphagia, polydipsia or polyuria. She is obese, weight is trending down slowly.  She has obesity, HTN and also CKI and is on ARB. She also has dyslipidemia and is on statin therapy, last LDL was slightly above goal at 65 .   Dyslipidemia : doing well on Rosuvastatin, last LDL down to 65 , good cholesterol was up to 51    HTN: she takes losartan 100 mg daily , states ACE caused a cough, no chest pain or palpitation, BP is at goal.  No side effects of medication    OA: left hip and both knees. She is no longer taking NSAID's , only on Tylenol prn, she has  CKI stage IIIa, she uses a walker all the time, inside and outside the home, she has difficulty getting from chairs and also toilet, she needs a raised toilet seat, she has tub bench - she never takes a shower without a relative at home . Her great grandson who is 22 yo lives with her    Morbid obesity: BMI above 35 with co-morbidities. she states her weight as an adult was 158 lbs, after third child weight was 132 lbs and remained that way until 1982 after she lost her mother, and worse since 1991 when her sister diet, she went above 200 lbs and has been stable since. She states drinks sodas seldom she is now drinking more water    CKI stage IIIa: she used to see Dr. Thedore Mins,  she has a history of  microalbuminuria at 49 , now on ARB, GFR improved up from 52 's to 34  She denies pruritis and has normal urine output. Reminded her to not take NSAID's , she takes Tylenol for pain prn    Insomnia: she spends a lot of time watching TV and staying in her bed during the day due to OA and pain, therefore her sleep  is very interrupted at night. She also uses her phone in the middle of the night. Discussed sleep hygiene and try to do some chair exercise during the day   Patient Active Problem List   Diagnosis Date Noted   Dyslipidemia associated with type 2 diabetes mellitus (HCC) 06/18/2021   Diabetes mellitus with microalbuminuria (HCC) 06/18/2021   Morbid obesity (HCC) 11/04/2017   Osteopenia of left hip 06/01/2017   Controlled type 2 diabetes mellitus with chronic kidney disease (HCC) 11/28/2015   CKD (chronic kidney disease) stage 3, GFR 30-59 ml/min (HCC) 11/28/2015   Hypertension 12/04/2014   Hyperlipidemia 12/04/2014    Past Surgical History:  Procedure Laterality Date   COLONOSCOPY W/ POLYPECTOMY  05/16/1994   COLONOSCOPY W/ POLYPECTOMY  12/23/2011   adenomatous polyp   COLONOSCOPY WITH PROPOFOL N/A 08/12/2017   Procedure: COLONOSCOPY WITH PROPOFOL;  Surgeon: Scot Jun, MD;  Location: [facility]COPY;  Service: Endoscopy;  Laterality: N/A;   KNEE SURGERY Left 2010   arthroscopic    Family History  Problem Relation Age of Onset   Cancer Mother        lung   Cancer Father  brain   Aneurysm Sister    Cancer Brother        pancreatic   Cancer Brother        esophagus   Colon cancer Paternal Grandfather    Multiple sclerosis Daughter    Diabetes Daughter     Social History   Tobacco Use   Smoking status: Former    Current packs/day: 0.00    Average packs/day: 2.0 packs/day for 40.0 years (80.0 ttl pk-yrs)    Types: Cigarettes    Start date: 83    Quit date: 2002    Years since quitting: 23.2   Smokeless tobacco: Never   Tobacco comments:    quit in 2002. Has not resumed smoking since 2002. Pt does not require smoking cessation materials.  Substance Use Topics   Alcohol use: No     Current Outpatient Medications:    Cholecalciferol (VITAMIN D-3) 1000 units CAPS, Take 1 capsule (1,000 Units total) by mouth daily., Disp: 30 capsule, Rfl: 0   losartan  (COZAAR) 100 MG tablet, Take 1 tablet (100 mg total) by mouth daily., Disp: 90 tablet, Rfl: 1   Omega-3 Fatty Acids (FISH OIL) 1000 MG CAPS, Take 1,000 mg by mouth daily. , Disp: , Rfl:    rosuvastatin (CRESTOR) 20 MG tablet, Take 1 tablet (20 mg total) by mouth daily., Disp: 90 tablet, Rfl: 1  Allergies  Allergen Reactions   Ace Inhibitors     cough    I personally reviewed active problem list, medication list, allergies, family history with the patient/caregiver today.   ROS  Ten systems reviewed and is negative except as mentioned in HPI    Objective  Vitals:   06/24/23 1100  BP: 124/78  Pulse: 95  Resp: 16  SpO2: 98%  Weight: 210 lb 11.2 oz (95.6 kg)  Height: 5\' 5"  (1.651 m)    Body mass index is 35.06 kg/m.  Physical Exam  Constitutional: Patient appears well-developed and well-nourished. Obese  No distress.  HEENT: head atraumatic, normocephalic, pupils equal and reactive to light, neck supple Cardiovascular: Normal rate, regular rhythm and normal heart sounds.  No murmur heard. No BLE edema. Pulmonary/Chest: Effort normal and breath sounds normal. No respiratory distress. Abdominal: Soft.  There is no tenderness. Muscular using a walker, leaning forward, slow gait , crepitus with extension of both knees  Psychiatric: Patient has a normal mood and affect. behavior is normal. Judgment and thought content normal.   Recent Results (from the past 2160 hours)  Urine Microalbumin w/creat. ratio     Status: Abnormal   Collection Time: 04/02/23 11:57 AM  Result Value Ref Range   Creatinine, Urine 337 (H) 20 - 275 mg/dL    Comment: Verified by repeat analysis. .    Microalb, Ur 5.9 mg/dL    Comment: Reference Range Not established    Microalb Creat Ratio 18 <30 mg/g creat    Comment: . The ADA defines abnormalities in albumin excretion as follows: [name] Albuminuria Category        Result (mg/g creatinine) . Normal to Mildly increased   <30 Moderately increased          30-299  Severely increased           > OR = 300 . The ADA recommends that at least two of three specimens collected within a 3-6 month period be abnormal before considering a patient to be within a diagnostic category.     Diabetic Foot Exam:  Diabetic foot exam  was performed with the following findings:   Normal sensation of 10g monofilament Intact posterior tibialis and dorsalis pedis pulses Thick toenails, looks like fungus, has dry skin worse on left foot       PHQ2/9:    06/24/2023   10:59 AM 12/19/2022   11:16 AM 06/17/2022   11:45 AM 05/30/2022    2:49 PM 12/17/2021   11:03 AM  Depression screen PHQ 2/9  Decreased Interest 0 0 0 0 0  Down, Depressed, Hopeless 0 0 0 0 0  PHQ - 2 Score 0 0 0 0 0  Altered sleeping 0 0 0  0  Tired, decreased energy 0 0 0  0  Change in appetite 0 0 0  0  Feeling bad or failure about yourself  0 0 0  0  Trouble concentrating 0 0 0  0  Moving slowly or fidgety/restless 0 0 0  0  Suicidal thoughts 0 0 0  0  PHQ-9 Score 0 0 0  0  Difficult doing work/chores Not difficult at all        phq 9 is negative  Fall Risk:    06/24/2023   10:52 AM 04/02/2023   10:48 AM 12/19/2022   11:16 AM 06/17/2022   11:45 AM 05/30/2022    2:46 PM  Fall Risk   Falls in the past year? 0 0 0 0 0  Number falls in past yr: 0 0 0  0  Injury with Fall? 0 0 0  0  Risk for fall due to : No Fall Risks No Fall Risks Impaired mobility;Impaired balance/gait Impaired balance/gait No Fall Risks  Follow up Falls prevention discussed;Education provided;Falls evaluation completed Falls prevention discussed;Education provided;Falls evaluation completed Falls prevention discussed Falls prevention discussed;Education provided;Falls evaluation completed Education provided;Falls prevention discussed     Assessment & Plan  1. Dyslipidemia associated with type 2 diabetes mellitus (HCC) (Primary)  - Hemoglobin A1c - rosuvastatin (CRESTOR) 20 MG tablet; Take 1 tablet (20  mg total) by mouth daily.  Dispense: 90 tablet; Refill: 1  2. Morbid obesity (HCC)  Discussed with the patient the risk posed by an increased BMI. Discussed importance of portion control, calorie counting and at least 150 minutes of physical activity weekly. Avoid sweet beverages and drink more water. Eat at least 6 servings of fruit and vegetables daily    3. Stage 3a chronic kidney disease (HCC)  Stable   4. Obesity, diabetes, and hypertension syndrome (HCC)  Discussed labs with patient, continue to try losing weight  5. Gait difficulty  Using walker, we will order a raised toilet seat   6. Primary osteoarthritis of both knees  Stab;e  7. Essential hypertension  BP is at goal   8. Vitamin D deficiency  Continue supplementation  9. Controlled type 2 diabetes mellitus with stage 3 chronic kidney disease, without long-term current use of insulin (HCC)

## 2023-06-25 ENCOUNTER — Encounter: Payer: Self-pay | Admitting: Family Medicine

## 2023-06-29 ENCOUNTER — Encounter: Payer: Self-pay | Admitting: Family Medicine

## 2023-08-13 ENCOUNTER — Telehealth: Payer: Self-pay

## 2023-08-13 NOTE — Telephone Encounter (Signed)
 Copied from CRM [id number]. Topic: General - Call Back - No Documentation >> Aug 12, 2023  3:54 PM Tiffany S wrote: Reason for CRM: Patient is need home medical supplies toilet with handles please follow up with patient

## 2023-08-13 NOTE — Telephone Encounter (Signed)
 Called patient to inform her we did place the DME order and it was sent to [facility]. Pt will be calling them to get more information on the order.

## 2023-11-12 ENCOUNTER — Ambulatory Visit: Payer: Medicare Other

## 2023-11-12 DIAGNOSIS — Z0000 Encounter for general adult medical examination without abnormal findings: Secondary | ICD-10-CM

## 2023-11-12 NOTE — Patient Instructions (Signed)
 Ms. Tammy Lozano , Thank you for taking time out of your busy schedule to complete your Annual Wellness Visit with me. I enjoyed our conversation and look forward to speaking with you again next year. I, as well as your care team,  appreciate your ongoing commitment to your health goals. Please review the following plan we discussed and let me know if I can assist you in the future.   Follow up Visits: 11/17/24 @ 12:40 PM BY PHONE We will see or speak with you next year for your Next Medicare AWV with our clinical staff Have you seen your provider in the last 6 months (3 months if uncontrolled diabetes)? Yes  Clinician Recommendations:  Aim for 30 minutes of exercise or brisk walking, 6-8 glasses of water, and 5 servings of fruits and vegetables each day. TAKE CARE!      This is a list of the screenings recommended for you:  Health Maintenance  Topic Date Due   Zoster (Shingles) Vaccine (1 of 2) Never done   COVID-19 Vaccine (5 - 2024-25 season) 12/07/2022   Eye exam for diabetics  06/05/2023   Flu Shot  11/06/2023   Mammogram  06/23/2024*   DEXA scan (bone density measurement)  06/23/2024*   Yearly kidney function blood test for diabetes  12/19/2023   Complete foot exam   12/19/2023   Hemoglobin A1C  12/25/2023   DTaP/Tdap/Td vaccine (2 - Td or Tdap) 01/25/2024   Yearly kidney health urinalysis for diabetes  04/01/2024   Medicare Annual Wellness Visit  11/11/2024   Colon Cancer Screening  08/13/2027   Pneumococcal Vaccine for age over 25  Completed   Hepatitis B Vaccine  Aged Out   HPV Vaccine  Aged Out   Meningitis B Vaccine  Aged Out  *Topic was postponed. The date shown is not the original due date.    Advanced directives: (ACP Link)Information on Advanced Care Planning can be found at [place]  Secretary of [facility] Advance Health Care Directives Advance Health Care Directives. [email or web address]  Advance Care Planning is important because it:  [x]  Makes sure you receive the medical care  that is consistent with your values, goals, and preferences  [x]  It provides guidance to your family and loved ones and reduces their decisional burden about whether or not they are making the right decisions based on your wishes.  Follow the link provided in your after visit summary or read over the paperwork we have mailed to you to help you started getting your Advance Directives in place. If you need assistance in completing these, please reach out to us  so that we can help you!

## 2023-11-12 NOTE — Progress Notes (Signed)
 Subjective:   Tammy Lozano is a 83 y.o. who presents for a Medicare Wellness preventive visit.  As a reminder, Annual Wellness Visits don't include a physical exam, and some assessments may be limited, especially if this visit is performed virtually. We may recommend an in-person follow-up visit with your provider if needed.  Visit Complete: Virtual I connected with  [name] on 11/12/23 by a audio enabled telemedicine application and verified that I am speaking with the correct person using two identifiers.  Patient Location: Home  Provider Location: Home Office  I discussed the limitations of evaluation and management by telemedicine. The patient expressed understanding and agreed to proceed.  Vital Signs: Because this visit was a virtual/telehealth visit, some criteria may be missing or patient reported. Any vitals not documented were not able to be obtained and vitals that have been documented are patient reported.  VideoDeclined- This patient declined Librarian, academic. Therefore the visit was completed with audio only.  Persons Participating in Visit: Patient.  AWV Questionnaire: No: Patient Medicare AWV questionnaire was not completed prior to this visit.  Cardiac Risk Factors include: advanced age (>64men, >63 women);dyslipidemia;hypertension;sedentary lifestyle;obesity (BMI >30kg/m2)     Objective:    There were no vitals filed for this visit. There is no height or weight on file to calculate BMI.     11/12/2023    2:48 PM 05/30/2022    2:55 PM 05/23/2021   10:36 AM 05/22/2020   10:23 AM 04/22/2019    8:46 AM 03/29/2018   11:25 AM 03/23/2018   11:22 AM  Advanced Directives  Does Patient Have a Medical Advance Directive? No No No No No No  No   Would patient like information on creating a medical advance directive? No - Patient declined Yes (ED - Information included in AVS) No - Patient declined No - Patient declined No - Patient  declined No - Patient declined  Yes (MAU/Ambulatory/Procedural Areas - Information given)      Data saved with a previous flowsheet row definition    Current Medications (verified) Outpatient Encounter Medications as of 11/12/2023  Medication Sig   Cholecalciferol (VITAMIN D -3) 1000 units CAPS Take 1 capsule (1,000 Units total) by mouth daily.   losartan  (COZAAR ) 100 MG tablet Take 1 tablet (100 mg total) by mouth daily.   Omega-3 Fatty Acids (FISH OIL) 1000 MG CAPS Take 1,000 mg by mouth daily.    rosuvastatin  (CRESTOR ) 20 MG tablet Take 1 tablet (20 mg total) by mouth daily.   No facility-administered encounter medications on file as of 11/12/2023.    Allergies (verified) Ace inhibitors   History: Past Medical History:  Diagnosis Date   Allergy    Cataract 2022   Chronic kidney disease    stage 3   Diabetes mellitus without complication (HCC)    Hyperlipidemia    Hypertension    Osteoarthritis    Past Surgical History:  Procedure Laterality Date   COLONOSCOPY W/ POLYPECTOMY  05/16/1994   COLONOSCOPY W/ POLYPECTOMY  12/23/2011   adenomatous polyp   COLONOSCOPY WITH PROPOFOL  N/A 08/12/2017   Procedure: COLONOSCOPY WITH PROPOFOL ;  Surgeon: Viktoria Lamar DASEN, MD;  Location: [facility] ENDOSCOPY;  Service: Endoscopy;  Laterality: N/A;   KNEE SURGERY Left 2010   arthroscopic   Family History  Problem Relation Age of Onset   Cancer Mother        lung   Cancer Father        brain  Aneurysm Sister    Cancer Brother        pancreatic   Cancer Brother        esophagus   Colon cancer Paternal Grandfather    Multiple sclerosis Daughter    Diabetes Daughter    Social History   Socioeconomic History   Marital status: Divorced    Spouse name: Not on file   Number of children: 3   Years of education: 81   Highest education level: 12th grade  Occupational History    Employer: RETIRED  Tobacco Use   Smoking status: Former    Current packs/day: 0.00    Average packs/day: 2.0  packs/day for 40.0 years (80.0 ttl pk-yrs)    Types: Cigarettes    Start date: 61    Quit date: 2002    Years since quitting: 23.6   Smokeless tobacco: Never   Tobacco comments:    quit in 2002. Has not resumed smoking since 2002. Pt does not require smoking cessation materials.  Vaping Use   Vaping status: Never Used  Substance and Sexual Activity   Alcohol use: No   Drug use: Never   Sexual activity: Not Currently    Birth control/protection: None  Other Topics Concern   Not on file  Social History Narrative   Her 69 great-grandson lives with her, her grandson and his wife live in the same apartment complex    Social Drivers of Health   Financial Resource Strain: Low Risk  (11/12/2023)   Overall Financial Resource Strain (CARDIA)    Difficulty of Paying Living Expenses: Not hard at all  Food Insecurity: No Food Insecurity (11/12/2023)   Hunger Vital Sign    Worried About Running Out of Food in the Last Year: Never true    Ran Out of Food in the Last Year: Never true  Transportation Needs: No Transportation Needs (11/12/2023)   PRAPARE - Administrator, Civil Service (Medical): No    Lack of Transportation (Non-Medical): No  Physical Activity: Insufficiently Active (11/12/2023)   Exercise Vital Sign    Days of Exercise per Week: 3 days    Minutes of Exercise per Session: 30 min  Stress: No Stress Concern Present (11/12/2023)   [employer] of Occupational Health - Occupational Stress Questionnaire    Feeling of Stress: Not at all  Social Connections: Socially Isolated (11/12/2023)   Social Connection and Isolation Panel    Frequency of Communication with Friends and Family: More than three times a week    Frequency of Social Gatherings with Friends and Family: More than three times a week    Attends Religious Services: Never    Database administrator or Organizations: No    Attends Banker Meetings: Never    Marital Status: Divorced    Tobacco  Counseling Counseling given: Not Answered Tobacco comments: quit in 2002. Has not resumed smoking since 2002. Pt does not require smoking cessation materials.    Clinical Intake:  Pre-visit preparation completed: Yes  Pain : No/denies pain     BMI - recorded: 34.9 Nutritional Status: BMI > 30  Obese Nutritional Risks: None Diabetes: No  Lab Results  Component Value Date   HGBA1C 6.0 (H) 06/24/2023   HGBA1C 6.1 (A) 12/19/2022   HGBA1C 6.0 (A) 06/17/2022     How often do you need to have someone help you when you read instructions, pamphlets, or other written materials from your doctor or pharmacy?: 1 - Never  Interpreter Needed?: No  Information entered by :: JHONNIE DAS, LPN   Activities of Daily Living    11/12/2023    2:49 PM 12/19/2022   11:16 AM  In your present state of health, do you have any difficulty performing the following activities:  Hearing? 0 0  Vision? 0 0  Difficulty concentrating or making decisions? 0 0  Walking or climbing stairs? 1 1  Comment HIPS/KNEES HURT   Dressing or bathing? 0 0  Doing errands, shopping? 0 0  Preparing Food and eating ? N   Using the Toilet? N   In the past six months, have you accidently leaked urine? N   Do you have problems with loss of bowel control? N   Managing your Medications? N   Managing your Finances? N   Housekeeping or managing your Housekeeping? N     Patient Care Team: Sowles, Krichna, MD as PCP - [facility] (Family Medicine) [facility], [facility] (Optometry)  I have updated your Care Teams any recent Medical Services you may have received from other providers in the past year.     Assessment:   This is a routine wellness examination for Kamilya.  Hearing/Vision screen Hearing Screening - Comments:: NO AIDS Vision Screening - Comments:: WEARS GLASSES ALL DAY- [facility]- LAST VISIT IN APRIL   Goals Addressed             This Visit's Progress    DIET - EAT MORE FRUITS AND VEGETABLES          Depression Screen     11/12/2023    2:46 PM 06/24/2023   10:59 AM 12/19/2022   11:16 AM 06/17/2022   11:45 AM 05/30/2022    2:49 PM 12/17/2021   11:03 AM 06/18/2021   10:46 AM  PHQ 2/9 Scores  PHQ - 2 Score 0 0 0 0 0 0 0  PHQ- 9 Score 0 0 0 0  0 0    Fall Risk     11/12/2023    2:49 PM 06/24/2023   10:52 AM 04/02/2023   10:48 AM 12/19/2022   11:16 AM 06/17/2022   11:45 AM  Fall Risk   Falls in the past year? 0 0 0 0 0  Number falls in past yr: 0 0 0 0   Injury with Fall? 0 0 0 0   Risk for fall due to : No Fall Risks No Fall Risks No Fall Risks Impaired mobility;Impaired balance/gait Impaired balance/gait  Follow up Falls evaluation completed;Falls prevention discussed Falls prevention discussed;Education provided;Falls evaluation completed Falls prevention discussed;Education provided;Falls evaluation completed Falls prevention discussed Falls prevention discussed;Education provided;Falls evaluation completed    MEDICARE RISK AT HOME:  Medicare Risk at Home Any stairs in or around the home?: Yes If so, are there any without handrails?: No Home free of loose throw rugs in walkways, pet beds, electrical cords, etc?: Yes Adequate lighting in your home to reduce risk of falls?: Yes Life alert?: No Use of a cane, walker or w/c?: Yes (WALKER W/ SEAT ALL THE TIME) Grab bars in the bathroom?: Yes Shower chair or bench in shower?: Yes Elevated toilet seat or a handicapped toilet?: Yes  TIMED UP AND GO:  Was the test performed?  No  Cognitive Function: 6CIT completed        05/30/2022    2:58 PM 04/22/2019    8:49 AM 03/23/2018   11:37 AM 03/20/2017   10:52 AM 03/17/2016    3:06 PM  6CIT Screen  What Year? 0 points 0 points 0 points 0 points 0 points  What month? 0 points 0 points 0 points 0 points 0 points  What time? 0 points 0 points 0 points 0 points 0 points  Count back from 20 0 points 0 points 0 points 0 points 0 points  Months in reverse 0 points 0 points 0  points 0 points 0 points  Repeat phrase 0 points 0 points 0 points 2 points 0 points  Total Score 0 points 0 points 0 points 2 points 0 points    Immunizations Immunization History  Administered Date(s) Administered   Fluad Quad(high Dose 65+) 12/19/2022   PFIZER(Purple Top)SARS-COV-2 Vaccination 06/23/2019, 07/19/2019, 03/07/2020, 03/09/2020   Pneumococcal Conjugate-13 02/28/2019   Pneumococcal Polysaccharide-23 11/04/2017   Tdap 01/24/2014    Screening Tests Health Maintenance  Topic Date Due   Zoster Vaccines- Shingrix (1 of 2) Never done   COVID-19 Vaccine (5 - 2024-25 season) 12/07/2022   OPHTHALMOLOGY EXAM  06/05/2023   INFLUENZA VACCINE  11/06/2023   MAMMOGRAM  06/23/2024 (Originally 05/09/1958)   DEXA SCAN  06/23/2024 (Originally 05/09/2004)   Diabetic kidney evaluation - eGFR measurement  12/19/2023   FOOT EXAM  12/19/2023   HEMOGLOBIN A1C  12/25/2023   DTaP/Tdap/Td (2 - Td or Tdap) 01/25/2024   Diabetic kidney evaluation - Urine ACR  04/01/2024   Medicare Annual Wellness (AWV)  11/11/2024   Colonoscopy  08/13/2027   Pneumococcal Vaccine: 50+ Years  Completed   Hepatitis B Vaccines  Aged Out   HPV VACCINES  Aged Out   Meningococcal B Vaccine  Aged Out    Health Maintenance  Health Maintenance Due  Topic Date Due   Zoster Vaccines- Shingrix (1 of 2) Never done   COVID-19 Vaccine (5 - 2024-25 season) 12/07/2022   OPHTHALMOLOGY EXAM  06/05/2023   INFLUENZA VACCINE  11/06/2023   Health Maintenance Items Addressed: AGED OUT OF MAMMOGRAM, COLONOSCOPY & BDS; UP TO DATE ON SHOTS EXCEPT COVID & SHINGRIX  Additional Screening:  Vision Screening: Recommended annual ophthalmology exams for early detection of glaucoma and other disorders of the eye. Would you like a referral to an eye doctor? No    Dental Screening: Recommended annual dental exams for proper oral hygiene  Community Resource Referral / Chronic Care Management: CRR required this visit?  No   CCM  required this visit?  No   Plan:    I have personally reviewed and noted the following in the patient's chart:   Medical and social history Use of alcohol, tobacco or illicit drugs  Current medications and supplements including opioid prescriptions. Patient is not currently taking opioid prescriptions. Functional ability and status Nutritional status Physical activity Advanced directives List of other physicians Hospitalizations, surgeries, and ER visits in previous 12 months Vitals Screenings to include cognitive, depression, and falls Referrals and appointments  In addition, I have reviewed and discussed with patient certain preventive protocols, quality metrics, and best practice recommendations. A written personalized care plan for preventive services as well as general preventive health recommendations were provided to patient.   Jhonnie GORMAN Das, LPN   04/08/7972   After Visit Summary: (MyChart) Due to this being a telephonic visit, the after visit summary with patients personalized plan was offered to patient via MyChart   Notes: Nothing significant to report at this time.

## 2023-12-22 ENCOUNTER — Other Ambulatory Visit: Payer: Self-pay | Admitting: Family Medicine

## 2023-12-22 DIAGNOSIS — E1129 Type 2 diabetes mellitus with other diabetic kidney complication: Secondary | ICD-10-CM

## 2023-12-22 DIAGNOSIS — E1169 Type 2 diabetes mellitus with other specified complication: Secondary | ICD-10-CM

## 2023-12-22 DIAGNOSIS — I10 Essential (primary) hypertension: Secondary | ICD-10-CM

## 2023-12-22 DIAGNOSIS — N183 Chronic kidney disease, stage 3 unspecified: Secondary | ICD-10-CM

## 2023-12-25 ENCOUNTER — Ambulatory Visit: Admitting: Family Medicine

## 2023-12-25 ENCOUNTER — Encounter: Payer: Self-pay | Admitting: Family Medicine

## 2023-12-25 VITALS — BP 126/76 | HR 93 | Resp 16 | Ht 65.0 in | Wt 215.6 lb

## 2023-12-25 DIAGNOSIS — E1129 Type 2 diabetes mellitus with other diabetic kidney complication: Secondary | ICD-10-CM | POA: Diagnosis not present

## 2023-12-25 DIAGNOSIS — N183 Chronic kidney disease, stage 3 unspecified: Secondary | ICD-10-CM

## 2023-12-25 DIAGNOSIS — R809 Proteinuria, unspecified: Secondary | ICD-10-CM

## 2023-12-25 DIAGNOSIS — I129 Hypertensive chronic kidney disease with stage 1 through stage 4 chronic kidney disease, or unspecified chronic kidney disease: Secondary | ICD-10-CM

## 2023-12-25 DIAGNOSIS — R6889 Other general symptoms and signs: Secondary | ICD-10-CM

## 2023-12-25 DIAGNOSIS — E1169 Type 2 diabetes mellitus with other specified complication: Secondary | ICD-10-CM | POA: Diagnosis not present

## 2023-12-25 DIAGNOSIS — E785 Hyperlipidemia, unspecified: Secondary | ICD-10-CM

## 2023-12-25 DIAGNOSIS — E559 Vitamin D deficiency, unspecified: Secondary | ICD-10-CM

## 2023-12-25 DIAGNOSIS — E1159 Type 2 diabetes mellitus with other circulatory complications: Secondary | ICD-10-CM

## 2023-12-25 DIAGNOSIS — N1831 Chronic kidney disease, stage 3a: Secondary | ICD-10-CM

## 2023-12-25 DIAGNOSIS — E669 Obesity, unspecified: Secondary | ICD-10-CM

## 2023-12-25 LAB — POCT GLYCOSYLATED HEMOGLOBIN (HGB A1C): Hemoglobin A1C: 5.8 % — AB (ref 4.0–5.6)

## 2023-12-25 MED ORDER — ROSUVASTATIN CALCIUM 20 MG PO TABS: 20.0000 mg | ORAL_TABLET | Freq: Every day | ORAL | 1 refills | Status: AC

## 2023-12-25 MED ORDER — LOSARTAN POTASSIUM 100 MG PO TABS: 100.0000 mg | ORAL_TABLET | Freq: Every day | ORAL | 1 refills | Status: AC

## 2023-12-25 NOTE — Progress Notes (Signed)
 Name: Tammy Lozano   MRN: [id number]    DOB: 15-May-1940   Date:12/25/2023       Progress Note  Subjective  Chief Complaint  Chief Complaint  Patient presents with   Medical Management of Chronic Issues   Discussed the use of AI scribe software for clinical note transcription with the patient, who gave verbal consent to proceed.  History of Present Illness Tammy Lozano is an 83 year old female who presents for a regular follow-up visit.  She recently had a home visit by a well nurse from [employer], during which her blood pressure was recorded as high 170-180's . She attributes this to the physical exertion of getting out of bed and standing on a scale while holding onto her walker.  She underwent a home screening test for peripheral arterial disease (PAD) using a Doppler device on her fingers, which indicated a severe level. She has not seen a vascular doctor before and has no symptoms of claudication, likely due to her limited walking ability with a walker.  Her type 2 diabetes is well-controlled with an A1c of 5.8%, down from 6% previously. She also has dyslipidemia, hypertension, obesity, and chronic kidney disease stage 3A. Her GFR has ranged between 55 and 43, and her vitamin D  levels have fluctuated in the past but are currently stable. She continues to take rosuvastatin  for cholesterol management, which is effective with her LDL at 65 and HDL in the normal range.  She takes losartan  for hypertension, which is well-managed, and reports no dizziness. She also takes fish oil and vitamin C supplements.  She is experiencing grief following the recent death of a favorite actor, which has affected her emotionally, though she is able to sleep and perform daily activities. She describes feeling 'hurt to my core' and has been crying intermittently.  She declines flu and shingles vaccinations, as well as bone density and mammogram screenings, expressing no perceived need for these  preventive measures.    Patient Active Problem List   Diagnosis Date Noted   Dyslipidemia associated with type 2 diabetes mellitus (HCC) 06/18/2021   Diabetes mellitus with microalbuminuria (HCC) 06/18/2021   Morbid obesity (HCC) 11/04/2017   Osteopenia of left hip 06/01/2017   Controlled type 2 diabetes mellitus with chronic kidney disease (HCC) 11/28/2015   CKD (chronic kidney disease) stage 3, GFR 30-59 ml/min (HCC) 11/28/2015   Hypertension 12/04/2014   Hyperlipidemia 12/04/2014    Past Surgical History:  Procedure Laterality Date   COLONOSCOPY W/ POLYPECTOMY  05/16/1994   COLONOSCOPY W/ POLYPECTOMY  12/23/2011   adenomatous polyp   COLONOSCOPY WITH PROPOFOL  N/A 08/12/2017   Procedure: COLONOSCOPY WITH PROPOFOL ;  Surgeon: Viktoria Lamar DASEN, MD;  Location: [facility] ENDOSCOPY;  Service: Endoscopy;  Laterality: N/A;   KNEE SURGERY Left 2010   arthroscopic    Family History  Problem Relation Age of Onset   Cancer Mother        lung   Cancer Father        brain   Aneurysm Sister    Cancer Brother        pancreatic   Cancer Brother        esophagus   Colon cancer Paternal Grandfather    Multiple sclerosis Daughter    Diabetes Daughter     Social History   Tobacco Use   Smoking status: Former    Current packs/day: 0.00    Average packs/day: 2.0 packs/day for 40.0 years (80.0 ttl pk-yrs)  Types: Cigarettes    Start date: 18    Quit date: 2002    Years since quitting: 23.7   Smokeless tobacco: Never   Tobacco comments:    quit in 2002. Has not resumed smoking since 2002. Pt does not require smoking cessation materials.  Substance Use Topics   Alcohol use: No     Current Outpatient Medications:    Cholecalciferol (VITAMIN D -3) 1000 units CAPS, Take 1 capsule (1,000 Units total) by mouth daily., Disp: 30 capsule, Rfl: 0   losartan  (COZAAR ) 100 MG tablet, Take 1 tablet (100 mg total) by mouth daily., Disp: 90 tablet, Rfl: 1   Omega-3 Fatty Acids (FISH OIL) 1000 MG  CAPS, Take 1,000 mg by mouth daily. , Disp: , Rfl:    rosuvastatin  (CRESTOR ) 20 MG tablet, Take 1 tablet (20 mg total) by mouth daily., Disp: 90 tablet, Rfl: 1  Allergies  Allergen Reactions   Ace Inhibitors     cough    I personally reviewed active problem list, medication list, allergies, family history with the patient/caregiver today.   ROS  Ten systems reviewed and is negative except as mentioned in HPI    Objective Physical Exam VITALS: BP- 126/76 CONSTITUTIONAL: Patient appears well-developed and well-nourished.  No distress. HEENT: Head atraumatic, normocephalic, neck supple. CARDIOVASCULAR: Normal rate, regular rhythm and normal heart sounds.  No murmur heard. No BLE edema. PULMONARY: Effort normal and breath sounds normal. No respiratory distress. ABDOMINAL: There is no tenderness or distention. MUSCULOSKELETAL: slow gait, uses a walker  PSYCHIATRIC: Patient has a normal mood and affect. behavior is normal. Judgment and thought content normal.  Vitals:   12/25/23 1131  BP: 126/76  Pulse: 93  Resp: 16  SpO2: 96%  Weight: 215 lb 9.6 oz (97.8 kg)  Height: 5' 5 (1.651 m)    Body mass index is 35.88 kg/m.  Recent Results (from the past 2160 hours)  POCT glycosylated hemoglobin (Hb A1C)     Status: Abnormal   Collection Time: 12/25/23 11:35 AM  Result Value Ref Range   Hemoglobin A1C 5.8 (A) 4.0 - 5.6 %   HbA1c POC (<> result, manual entry)     HbA1c, POC (prediabetic range)     HbA1c, POC (controlled diabetic range)      Diabetic Foot Exam:     PHQ2/9:    12/25/2023   11:22 AM 11/12/2023    2:46 PM 06/24/2023   10:59 AM 12/19/2022   11:16 AM 06/17/2022   11:45 AM  Depression screen PHQ 2/9  Decreased Interest 1 0 0 0 0  Down, Depressed, Hopeless 1 0 0 0 0  PHQ - 2 Score 2 0 0 0 0  Altered sleeping 1 0 0 0 0  Tired, decreased energy 0 0 0 0 0  Change in appetite 0 0 0 0 0  Feeling bad or failure about yourself  0 0 0 0 0  Trouble concentrating 1 0  0 0 0  Moving slowly or fidgety/restless 0 0 0 0 0  Suicidal thoughts 0 0 0 0 0  PHQ-9 Score 4 0 0 0 0  Difficult doing work/chores Somewhat difficult Not difficult at all Not difficult at all      phq 9 is positive  Fall Risk:    12/25/2023   11:22 AM 11/12/2023    2:49 PM 06/24/2023   10:52 AM 04/02/2023   10:48 AM 12/19/2022   11:16 AM  Fall Risk   Falls in the past year?  0 0 0 0 0  Number falls in past yr: 0 0 0 0 0  Injury with Fall? 0 0 0 0 0  Risk for fall due to : No Fall Risks No Fall Risks No Fall Risks No Fall Risks Impaired mobility;Impaired balance/gait  Follow up Falls evaluation completed Falls evaluation completed;Falls prevention discussed Falls prevention discussed;Education provided;Falls evaluation completed Falls prevention discussed;Education provided;Falls evaluation completed Falls prevention discussed      Assessment & Plan Type 2 diabetes mellitus with chronic kidney disease stage 3a, hypertension, dyslipidemia, and obesity Diabetes well-controlled with A1c 5.8%. CKD stage 3a with GFR 43-55. Hypertension managed with losartan , BP 126/76. Dyslipidemia controlled with rosuvastatin , LDL 65. Obesity part of metabolic syndrome. - Order blood work for kidney function and vitamin D  levels. - Continue rosuvastatin . - Continue losartan . - Advise consistent morning medication intake.  Risk for peripheral arterial disease Failed ABI screening. No claudication symptoms due to limited mobility. Risk factors: diabetes, hypertension, dyslipidemia. - Refer to vascular surgeon for evaluation. - Advise follow-up with vascular surgeon.  Vitamin D  deficiency Vitamin D  levels stable but require monitoring. - Order blood work for vitamin D  levels.  General Health Maintenance Declined flu, shingles vaccinations, bone density, and mammogram screenings. Eye exam completed in March. - Encourage bone density and mammogram screenings. - Discuss importance of vaccinations and  preventive care.

## 2023-12-26 LAB — CBC WITH DIFFERENTIAL/PLATELET
Absolute Lymphocytes: 1488 {cells}/uL (ref 850–3900)
Absolute Monocytes: 325 {cells}/uL (ref 200–950)
Basophils Absolute: 40 {cells}/uL (ref 0–200)
Basophils Relative: 0.7 %
Eosinophils Absolute: 154 {cells}/uL (ref 15–500)
Eosinophils Relative: 2.7 %
HCT: 41.6 % (ref 35.0–45.0)
Hemoglobin: 13.5 g/dL (ref 11.7–15.5)
MCH: 30.8 pg (ref 27.0–33.0)
MCHC: 32.5 g/dL (ref 32.0–36.0)
MCV: 94.8 fL (ref 80.0–100.0)
MPV: 9.1 fL (ref 7.5–12.5)
Monocytes Relative: 5.7 %
Neutro Abs: 3694 {cells}/uL (ref 1500–7800)
Neutrophils Relative %: 64.8 %
Platelets: 295 Thousand/uL (ref 140–400)
RBC: 4.39 Million/uL (ref 3.80–5.10)
RDW: 13.1 % (ref 11.0–15.0)
Total Lymphocyte: 26.1 %
WBC: 5.7 Thousand/uL (ref 3.8–10.8)

## 2023-12-26 LAB — COMPREHENSIVE METABOLIC PANEL WITH GFR
AG Ratio: 1.5 (calc) (ref 1.0–2.5)
ALT: 15 U/L (ref 6–29)
AST: 16 U/L (ref 10–35)
Albumin: 4 g/dL (ref 3.6–5.1)
Alkaline phosphatase (APISO): 77 U/L (ref 37–153)
BUN/Creatinine Ratio: 15 (calc) (ref 6–22)
BUN: 18 mg/dL (ref 7–25)
CO2: 26 mmol/L (ref 20–32)
Calcium: 9.3 mg/dL (ref 8.6–10.4)
Chloride: 104 mmol/L (ref 98–110)
Creat: 1.17 mg/dL — ABNORMAL HIGH (ref 0.60–0.95)
Globulin: 2.7 g/dL (ref 1.9–3.7)
Glucose, Bld: 112 mg/dL — ABNORMAL HIGH (ref 65–99)
Potassium: 3.7 mmol/L (ref 3.5–5.3)
Sodium: 142 mmol/L (ref 135–146)
Total Bilirubin: 1.4 mg/dL — ABNORMAL HIGH (ref 0.2–1.2)
Total Protein: 6.7 g/dL (ref 6.1–8.1)
eGFR: 46 mL/min/1.73m2 — ABNORMAL LOW (ref >=60)

## 2023-12-26 LAB — LIPID PANEL
Cholesterol: 117 mg/dL (ref <200)
HDL: 52 mg/dL (ref >=50)
LDL Cholesterol (Calc): 50 mg/dL
Non-HDL Cholesterol (Calc): 65 mg/dL (ref <130)
Total CHOL/HDL Ratio: 2.3 (calc) (ref <5.0)
Triglycerides: 70 mg/dL (ref <150)

## 2023-12-26 LAB — VITAMIN D 25 HYDROXY (VIT D DEFICIENCY, FRACTURES): Vit D, 25-Hydroxy: 63 ng/mL (ref 30–100)

## 2023-12-28 ENCOUNTER — Ambulatory Visit: Payer: Self-pay | Admitting: Family Medicine

## 2024-01-27 ENCOUNTER — Other Ambulatory Visit (INDEPENDENT_AMBULATORY_CARE_PROVIDER_SITE_OTHER): Payer: Self-pay | Admitting: Vascular Surgery

## 2024-01-27 DIAGNOSIS — R9389 Abnormal findings on diagnostic imaging of other specified body structures: Secondary | ICD-10-CM

## 2024-01-29 ENCOUNTER — Ambulatory Visit (INDEPENDENT_AMBULATORY_CARE_PROVIDER_SITE_OTHER)

## 2024-01-29 ENCOUNTER — Encounter (INDEPENDENT_AMBULATORY_CARE_PROVIDER_SITE_OTHER): Payer: Self-pay | Admitting: Vascular Surgery

## 2024-01-29 ENCOUNTER — Ambulatory Visit (INDEPENDENT_AMBULATORY_CARE_PROVIDER_SITE_OTHER): Admitting: Vascular Surgery

## 2024-01-29 VITALS — BP 152/88 | HR 94 | Resp 18 | Ht 61.0 in | Wt 217.6 lb

## 2024-01-29 DIAGNOSIS — E1122 Type 2 diabetes mellitus with diabetic chronic kidney disease: Secondary | ICD-10-CM

## 2024-01-29 DIAGNOSIS — R9389 Abnormal findings on diagnostic imaging of other specified body structures: Secondary | ICD-10-CM | POA: Diagnosis not present

## 2024-01-29 DIAGNOSIS — I10 Essential (primary) hypertension: Secondary | ICD-10-CM

## 2024-01-29 DIAGNOSIS — E1169 Type 2 diabetes mellitus with other specified complication: Secondary | ICD-10-CM

## 2024-01-29 DIAGNOSIS — I739 Peripheral vascular disease, unspecified: Secondary | ICD-10-CM

## 2024-01-29 DIAGNOSIS — E785 Hyperlipidemia, unspecified: Secondary | ICD-10-CM

## 2024-01-29 NOTE — Assessment & Plan Note (Signed)
 lipid control important in reducing the progression of atherosclerotic disease. Continue statin therapy

## 2024-01-29 NOTE — Assessment & Plan Note (Signed)
 blood glucose control important in reducing the progression of atherosclerotic disease. Also, involved in wound healing. On appropriate medications.

## 2024-01-29 NOTE — Progress Notes (Signed)
 Patient ID: Tammy Lozano, female   DOB: January 10, 1941, 83 y.o.   MRN: [id number]  Chief Complaint  Patient presents with   New Patient (Initial Visit)    ABI + Consult. Abnormal ABI home health screening.  GLENARD, KRICHNA    HPI Tammy Lozano is a 83 y.o. female.  I am asked to see the patient by Dr. GLENARD for evaluation of an abnormal home health PAD screening.  The patient is not had any major complaints of lifestyle-limiting claudication, ischemic rest pain, or ulceration.  She says her legs feel fine.  She is in her usual state of health.  Noninvasive studies were performed today to evaluate her lower extremity arterial perfusion. ABIs today are 1.05 on the right and 1.09 on the left with triphasic waveforms and normal digital pressures and waveforms bilaterally. No arterial insufficiency was seen.     Past Medical History:  Diagnosis Date   Allergy    Cataract 2022   Chronic kidney disease    stage 3   Diabetes mellitus without complication (HCC)    Hyperlipidemia    Hypertension    Osteoarthritis     Past Surgical History:  Procedure Laterality Date   COLONOSCOPY W/ POLYPECTOMY  05/16/1994   COLONOSCOPY W/ POLYPECTOMY  12/23/2011   adenomatous polyp   COLONOSCOPY WITH PROPOFOL  N/A 08/12/2017   Procedure: COLONOSCOPY WITH PROPOFOL ;  Surgeon: Viktoria Lamar DASEN, MD;  Location: [facility] ENDOSCOPY;  Service: Endoscopy;  Laterality: N/A;   KNEE SURGERY Left 2010   arthroscopic     Family History  Problem Relation Age of Onset   Cancer Mother        lung   Cancer Father        brain   Aneurysm Sister    Cancer Brother        pancreatic   Cancer Brother        esophagus   Colon cancer Paternal Grandfather    Multiple sclerosis Daughter    Diabetes Daughter       Social History   Tobacco Use   Smoking status: Former    Current packs/day: 0.00    Average packs/day: 2.0 packs/day for 40.0 years (80.0 ttl pk-yrs)    Types: Cigarettes    Start date: 56    Quit  date: 2002    Years since quitting: 23.8   Smokeless tobacco: Never   Tobacco comments:    quit in 2002. Has not resumed smoking since 2002. Pt does not require smoking cessation materials.  Vaping Use   Vaping status: Never Used  Substance Use Topics   Alcohol use: No   Drug use: Never     Allergies  Allergen Reactions   Ace Inhibitors     cough    Current Outpatient Medications  Medication Sig Dispense Refill   Cholecalciferol (VITAMIN D -3) 1000 units CAPS Take 1 capsule (1,000 Units total) by mouth daily. 30 capsule 0   losartan  (COZAAR ) 100 MG tablet Take 1 tablet (100 mg total) by mouth daily. 90 tablet 1   Omega-3 Fatty Acids (FISH OIL) 1000 MG CAPS Take 1,000 mg by mouth daily.      rosuvastatin  (CRESTOR ) 20 MG tablet Take 1 tablet (20 mg total) by mouth daily. 90 tablet 1   No current facility-administered medications for this visit.      REVIEW OF SYSTEMS (Negative unless checked)  Constitutional: [] Weight loss  [] Fever  [] Chills Cardiac: [] Chest pain   [] Chest pressure   []   Palpitations   [] Shortness of breath when laying flat   [] Shortness of breath at rest   [] Shortness of breath with exertion. Vascular:  [] Pain in legs with walking   [] Pain in legs at rest   [] Pain in legs when laying flat   [] Claudication   [] Pain in feet when walking  [] Pain in feet at rest  [] Pain in feet when laying flat   [] History of DVT   [] Phlebitis   [] Swelling in legs   [] Varicose veins   [] Non-healing ulcers Pulmonary:   [] Uses home oxygen   [] Productive cough   [] Hemoptysis   [] Wheeze  [] COPD   [] Asthma Neurologic:  [] Dizziness  [] Blackouts   [] Seizures   [] History of stroke   [] History of TIA  [] Aphasia   [] Temporary blindness   [] Dysphagia   [] Weakness or numbness in arms   [] Weakness or numbness in legs Musculoskeletal:  [x] Arthritis   [] Joint swelling   [] Joint pain   [] Low back pain Hematologic:  [] Easy bruising  [] Easy bleeding   [] Hypercoagulable state   [] Anemic   [] Hepatitis Gastrointestinal:  [] Blood in stool   [] Vomiting blood  [] Gastroesophageal reflux/heartburn   [] Abdominal pain Genitourinary:  [x] Chronic kidney disease   [] Difficult urination  [] Frequent urination  [] Burning with urination   [] Hematuria Skin:  [] Rashes   [] Ulcers   [] Wounds Psychological:  [] History of anxiety   []  History of major depression.    Physical Exam BP (!) 152/88   Pulse 94   Resp 18   Ht 5' 1 (1.549 m)   Wt 217 lb 9.6 oz (98.7 kg)   BMI 41.12 kg/m  Gen:  WD/WN, NAD. Appears younger than stated age. Head: [place]/AT, No temporalis wasting. Ear/Nose/Throat: Hearing grossly intact, nares w/o erythema or drainage, oropharynx w/o Erythema/Exudate Eyes: Conjunctiva clear, sclera non-icteric  Neck: trachea midline.  No JVD.  Pulmonary:  Good air movement, respirations not labored, no use of accessory muscles  Cardiac: RRR, no JVD Vascular:  Vessel Right Left  Radial Palpable Palpable                          PT Palpable  Palpable  DP palpable Palpable    Gastrointestinal:. No masses, surgical incisions, or scars. Musculoskeletal: M/S 5/5 throughout.  Extremities without ischemic changes.  No deformity or atrophy. Trace LE edema. Neurologic: Sensation grossly intact in extremities.  Symmetrical.  Speech is fluent. Motor exam as listed above. Psychiatric: Judgment intact, Mood & affect appropriate for pt's clinical situation. Dermatologic: No rashes or ulcers noted.  No cellulitis or open wounds.    Radiology No results found.  Labs Recent Results (from the past 2160 hours)  POCT glycosylated hemoglobin (Hb A1C)     Status: Abnormal   Collection Time: 12/25/23 11:35 AM  Result Value Ref Range   Hemoglobin A1C 5.8 (A) 4.0 - 5.6 %   HbA1c POC (<> result, manual entry)     HbA1c, POC (prediabetic range)     HbA1c, POC (controlled diabetic range)    Comprehensive metabolic panel with GFR     Status: Abnormal   Collection Time: 12/25/23 12:01 PM   Result Value Ref Range   Glucose, Bld 112 (H) 65 - 99 mg/dL    Comment: .            Fasting reference interval . For someone without known diabetes, a glucose value between 100 and 125 mg/dL is consistent with prediabetes and should be confirmed with a follow-up  test. .    BUN 18 7 - 25 mg/dL   Creat 8.82 (H) 9.39 - 0.95 mg/dL   eGFR 46 (L) > OR = 60 mL/min/1.74m2   BUN/Creatinine Ratio 15 6 - 22 (calc)   Sodium 142 135 - 146 mmol/L   Potassium 3.7 3.5 - 5.3 mmol/L   Chloride 104 98 - 110 mmol/L   CO2 26 20 - 32 mmol/L   Calcium  9.3 8.6 - 10.4 mg/dL   Total Protein 6.7 6.1 - 8.1 g/dL   Albumin 4.0 3.6 - 5.1 g/dL   Globulin 2.7 1.9 - 3.7 g/dL (calc)   AG Ratio 1.5 1.0 - 2.5 (calc)   Total Bilirubin 1.4 (H) 0.2 - 1.2 mg/dL   Alkaline phosphatase (APISO) 77 37 - 153 U/L   AST 16 10 - 35 U/L   ALT 15 6 - 29 U/L  CBC with Differential/Platelet     Status: None   Collection Time: 12/25/23 12:01 PM  Result Value Ref Range   WBC 5.7 3.8 - 10.8 Thousand/uL   RBC 4.39 3.80 - 5.10 Million/uL   Hemoglobin 13.5 11.7 - 15.5 g/dL   HCT 58.3 64.9 - 54.9 %   MCV 94.8 80.0 - 100.0 fL   MCH 30.8 27.0 - 33.0 pg   MCHC 32.5 32.0 - 36.0 g/dL    Comment: For adults, a slight decrease in the calculated MCHC value (in the range of 30 to 32 g/dL) is most likely not clinically significant; however, it should be interpreted with caution in correlation with other red cell parameters and the patient's clinical condition.    RDW 13.1 11.0 - 15.0 %   Platelets 295 140 - 400 Thousand/uL   MPV 9.1 7.5 - 12.5 fL   Neutro Abs 3,694 1,500 - 7,800 cells/uL   Absolute Lymphocytes 1,488 850 - 3,900 cells/uL   Absolute Monocytes 325 200 - 950 cells/uL   Eosinophils Absolute 154 15 - 500 cells/uL   Basophils Absolute 40 0 - 200 cells/uL   Neutrophils Relative % 64.8 %   Total Lymphocyte 26.1 %   Monocytes Relative 5.7 %   Eosinophils Relative 2.7 %   Basophils Relative 0.7 %  Lipid panel      Status: None   Collection Time: 12/25/23 12:01 PM  Result Value Ref Range   Cholesterol 117 <200 mg/dL   HDL 52 > OR = 50 mg/dL   Triglycerides 70 <849 mg/dL   LDL Cholesterol (Calc) 50 mg/dL (calc)    Comment: Reference range: <100 . Desirable range <100 mg/dL for primary prevention;   <70 mg/dL for patients with CHD or diabetic patients  with > or = 2 CHD risk factors. SABRA LDL-C is now calculated using the Martin-Hopkins  calculation, which is a validated novel method providing  better accuracy than the Friedewald equation in the  estimation of LDL-C.  Gladis APPLETHWAITE et al. SANDREA. 7986;689(80): 2061-2068  (http://education.[facility].com/faq/FAQ164)    Total CHOL/HDL Ratio 2.3 <5.0 (calc)   Non-HDL Cholesterol (Calc) 65 <869 mg/dL (calc)    Comment: For patients with diabetes plus 1 major ASCVD risk  factor, treating to a non-HDL-C goal of <100 mg/dL  (LDL-C of <29 mg/dL) is considered a therapeutic  option.   VITAMIN D  25 Hydroxy (Vit-D Deficiency, Fractures)     Status: None   Collection Time: 12/25/23 12:01 PM  Result Value Ref Range   Vit D, 25-Hydroxy 63 30 - 100 ng/mL    Comment: Vitamin D  Status  25-OH Vitamin D : . Deficiency:                    <20 ng/mL Insufficiency:             20 - 29 ng/mL Optimal:                 > or = 30 ng/mL . For 25-OH Vitamin D  testing on patients on  D2-supplementation and patients for whom quantitation  of D2 and D3 fractions is required, the [facility]ureD(TM) 25-OH VIT D, (D2,D3), LC/MS/MS is recommended: order  code 07111 (patients >75yrs). . See Note 1 . Note 1 . For additional information, please refer to  http://education.[facility].com/faq/FAQ199  (This link is being provided for informational/ educational purposes only.)     Assessment/Plan:  PAD (peripheral artery disease) ABIs today are 1.05 on the right and 1.09 on the left with triphasic waveforms and normal digital pressures and waveforms  bilaterally. No arterial insufficiency was seen.  No further work up planned at this point.  Hypertension blood pressure control important in reducing the progression of atherosclerotic disease. On appropriate oral medications.   Dyslipidemia associated with type 2 diabetes mellitus (HCC) lipid control important in reducing the progression of atherosclerotic disease. Continue statin therapy   Controlled type 2 diabetes mellitus with chronic kidney disease (HCC) blood glucose control important in reducing the progression of atherosclerotic disease. Also, involved in wound healing. On appropriate medications.      Selinda Gu 01/29/2024, 11:11 AM   This note was created with Dragon medical transcription system.  Any errors from dictation are unintentional.

## 2024-01-29 NOTE — Assessment & Plan Note (Signed)
 ABIs today are 1.05 on the right and 1.09 on the left with triphasic waveforms and normal digital pressures and waveforms bilaterally. No arterial insufficiency was seen.  No further work up planned at this point.

## 2024-01-29 NOTE — Assessment & Plan Note (Signed)
 blood pressure control important in reducing the progression of atherosclerotic disease. On appropriate oral medications.

## 2024-04-05 ENCOUNTER — Encounter: Payer: Medicare Other | Admitting: Family Medicine

## 2024-04-11 NOTE — Patient Instructions (Incomplete)
 Preventive Care 83 Years and Older, Female Preventive care refers to lifestyle choices and visits with your health care provider that can promote health and wellness. Preventive care visits are also called wellness exams. What can I expect for my preventive care visit? Counseling Your health care provider may ask you questions about your: Medical history, including: Past medical problems. Family medical history. Pregnancy and menstrual history. History of falls. Current health, including: Memory and ability to understand (cognition). Emotional well-being. Home life and relationship well-being. Sexual activity and sexual health. Lifestyle, including: Alcohol, nicotine or tobacco, and drug use. Access to firearms. Diet, exercise, and sleep habits. Work and work Astronomer. Sunscreen use. Safety issues such as seatbelt and bike helmet use. Physical exam Your health care provider will check your: Height and weight. These may be used to calculate your BMI (body mass index). BMI is a measurement that tells if you are at a healthy weight. Waist circumference. This measures the distance around your waistline. This measurement also tells if you are at a healthy weight and may help predict your risk of certain diseases, such as type 2 diabetes and high blood pressure. Heart rate and blood pressure. Body temperature. Skin for abnormal spots. What immunizations do I need?  Vaccines are usually given at various ages, according to a schedule. Your health care provider will recommend vaccines for you based on your age, medical history, and lifestyle or other factors, such as travel or where you work. What tests do I need? Screening Your health care provider may recommend screening tests for certain conditions. This may include: Lipid and cholesterol levels. Hepatitis C test. Hepatitis B test. HIV (human immunodeficiency virus) test. STI (sexually transmitted infection) testing, if you are at  risk. Lung cancer screening. Colorectal cancer screening. Diabetes screening. This is done by checking your blood sugar (glucose) after you have not eaten for a while (fasting). Mammogram. Talk with your health care provider about how often you should have regular mammograms. BRCA-related cancer screening. This may be done if you have a family history of breast, ovarian, tubal, or peritoneal cancers. Bone density scan. This is done to screen for osteoporosis. Talk with your health care provider about your test results, treatment options, and if necessary, the need for more tests. Follow these instructions at home: Eating and drinking  Eat a diet that includes fresh fruits and vegetables, whole grains, lean protein, and low-fat dairy products. Limit your intake of foods with high amounts of sugar, saturated fats, and salt. Take vitamin and mineral supplements as recommended by your health care provider. Do not drink alcohol if your health care provider tells you not to drink. If you drink alcohol: Limit how much you have to 0-1 drink a day. Know how much alcohol is in your drink. In the [place] one drink equals one 12 oz bottle of beer (355 mL), one 5 oz glass of wine (148 mL), or one 1 oz glass of hard liquor (44 mL). Lifestyle Brush your teeth every morning and night with fluoride toothpaste. Floss one time each day. Exercise for at least 30 minutes 5 or more days each week. Do not use any products that contain nicotine or tobacco. These products include cigarettes, chewing tobacco, and vaping devices, such as e-cigarettes. If you need help quitting, ask your health care provider. Do not use drugs. If you are sexually active, practice safe sex. Use a condom or other form of protection in order to prevent STIs. Take aspirin only as told by  your health care provider. Make sure that you understand how much to take and what form to take. Work with your health care provider to find out whether it  is safe and beneficial for you to take aspirin daily. Ask your health care provider if you need to take a cholesterol-lowering medicine (statin). Find healthy ways to manage stress, such as: Meditation, yoga, or listening to music. Journaling. Talking to a trusted person. Spending time with friends and family. Minimize exposure to UV radiation to reduce your risk of skin cancer. Safety Always wear your seat belt while driving or riding in a vehicle. Do not drive: If you have been drinking alcohol. Do not ride with someone who has been drinking. When you are tired or distracted. While texting. If you have been using any mind-altering substances or drugs. Wear a helmet and other protective equipment during sports activities. If you have firearms in your house, make sure you follow all gun safety procedures. What's next? Visit your health care provider once a year for an annual wellness visit. Ask your health care provider how often you should have your eyes and teeth checked. Stay up to date on all vaccines. This information is not intended to replace advice given to you by your health care provider. Make sure you discuss any questions you have with your health care provider. Document Revised: 09/19/2020 Document Reviewed: 09/19/2020 Elsevier Patient Education  2024 [employer].

## 2024-04-12 ENCOUNTER — Encounter: Payer: Self-pay | Admitting: Family Medicine

## 2024-06-22 ENCOUNTER — Ambulatory Visit: Admitting: Family Medicine

## 2024-11-17 ENCOUNTER — Ambulatory Visit
